# Patient Record
Sex: Female | Born: 1960 | Race: Black or African American | Hispanic: No | Marital: Single | Smoking: Current every day smoker
Health system: Southern US, Community
[De-identification: ages and names within clinical notes are randomized; demographics above are authoritative.]

## PROBLEM LIST (undated history)

## (undated) DIAGNOSIS — L309 Dermatitis, unspecified: Secondary | ICD-10-CM

## (undated) DIAGNOSIS — K5730 Diverticulosis of large intestine without perforation or abscess without bleeding: Secondary | ICD-10-CM

## (undated) DIAGNOSIS — K219 Gastro-esophageal reflux disease without esophagitis: Secondary | ICD-10-CM

## (undated) DIAGNOSIS — I10 Essential (primary) hypertension: Secondary | ICD-10-CM

## (undated) DIAGNOSIS — F329 Major depressive disorder, single episode, unspecified: Secondary | ICD-10-CM

## (undated) DIAGNOSIS — R011 Cardiac murmur, unspecified: Secondary | ICD-10-CM

## (undated) DIAGNOSIS — A048 Other specified bacterial intestinal infections: Secondary | ICD-10-CM

## (undated) DIAGNOSIS — T4145XA Adverse effect of unspecified anesthetic, initial encounter: Secondary | ICD-10-CM

## (undated) DIAGNOSIS — M719 Bursopathy, unspecified: Secondary | ICD-10-CM

## (undated) DIAGNOSIS — D649 Anemia, unspecified: Secondary | ICD-10-CM

## (undated) DIAGNOSIS — F419 Anxiety disorder, unspecified: Secondary | ICD-10-CM

## (undated) DIAGNOSIS — R51 Headache: Secondary | ICD-10-CM

## (undated) DIAGNOSIS — F99 Mental disorder, not otherwise specified: Secondary | ICD-10-CM

## (undated) DIAGNOSIS — E039 Hypothyroidism, unspecified: Secondary | ICD-10-CM

## (undated) DIAGNOSIS — M1990 Unspecified osteoarthritis, unspecified site: Secondary | ICD-10-CM

## (undated) DIAGNOSIS — J189 Pneumonia, unspecified organism: Secondary | ICD-10-CM

## (undated) DIAGNOSIS — I499 Cardiac arrhythmia, unspecified: Secondary | ICD-10-CM

## (undated) DIAGNOSIS — F32A Depression, unspecified: Secondary | ICD-10-CM

## (undated) DIAGNOSIS — J42 Unspecified chronic bronchitis: Secondary | ICD-10-CM

## (undated) DIAGNOSIS — F319 Bipolar disorder, unspecified: Secondary | ICD-10-CM

## (undated) DIAGNOSIS — T8859XA Other complications of anesthesia, initial encounter: Secondary | ICD-10-CM

## (undated) HISTORY — PX: COLONOSCOPY: SHX174

## (undated) HISTORY — DX: Other specified bacterial intestinal infections: A04.8

## (undated) HISTORY — PX: TUBAL LIGATION: SHX77

## (undated) HISTORY — PX: MOUTH SURGERY: SHX715

## (undated) HISTORY — DX: Diverticulosis of large intestine without perforation or abscess without bleeding: K57.30

## (undated) HISTORY — PX: ABDOMINAL HYSTERECTOMY: SHX81

---

## 1998-03-20 ENCOUNTER — Emergency Department (HOSPITAL_COMMUNITY): Admission: EM | Admit: 1998-03-20 | Discharge: 1998-03-20 | Payer: Self-pay | Admitting: Emergency Medicine

## 1998-06-17 ENCOUNTER — Ambulatory Visit (HOSPITAL_COMMUNITY): Admission: RE | Admit: 1998-06-17 | Discharge: 1998-06-17 | Payer: Self-pay | Admitting: Cardiology

## 1998-06-21 ENCOUNTER — Ambulatory Visit (HOSPITAL_COMMUNITY): Admission: RE | Admit: 1998-06-21 | Discharge: 1998-06-21 | Payer: Self-pay | Admitting: Neurology

## 1998-07-09 ENCOUNTER — Emergency Department (HOSPITAL_COMMUNITY): Admission: EM | Admit: 1998-07-09 | Discharge: 1998-07-09 | Payer: Self-pay | Admitting: Emergency Medicine

## 1998-09-21 ENCOUNTER — Ambulatory Visit (HOSPITAL_COMMUNITY): Admission: RE | Admit: 1998-09-21 | Discharge: 1998-09-21 | Payer: Self-pay | Admitting: Cardiology

## 1999-09-18 ENCOUNTER — Emergency Department (HOSPITAL_COMMUNITY): Admission: EM | Admit: 1999-09-18 | Discharge: 1999-09-18 | Payer: Self-pay | Admitting: Emergency Medicine

## 1999-09-18 ENCOUNTER — Encounter: Payer: Self-pay | Admitting: Cardiology

## 2001-01-18 ENCOUNTER — Emergency Department (HOSPITAL_COMMUNITY): Admission: EM | Admit: 2001-01-18 | Discharge: 2001-01-18 | Payer: Self-pay | Admitting: Emergency Medicine

## 2001-04-02 ENCOUNTER — Encounter: Payer: Self-pay | Admitting: Cardiology

## 2001-04-02 ENCOUNTER — Ambulatory Visit (HOSPITAL_COMMUNITY): Admission: RE | Admit: 2001-04-02 | Discharge: 2001-04-02 | Payer: Self-pay | Admitting: Cardiology

## 2001-08-22 ENCOUNTER — Encounter: Payer: Self-pay | Admitting: Emergency Medicine

## 2001-08-22 ENCOUNTER — Emergency Department (HOSPITAL_COMMUNITY): Admission: EM | Admit: 2001-08-22 | Discharge: 2001-08-22 | Payer: Self-pay | Admitting: Emergency Medicine

## 2001-08-23 ENCOUNTER — Encounter: Payer: Self-pay | Admitting: Emergency Medicine

## 2001-08-23 ENCOUNTER — Emergency Department (HOSPITAL_COMMUNITY): Admission: EM | Admit: 2001-08-23 | Discharge: 2001-08-23 | Payer: Self-pay | Admitting: Emergency Medicine

## 2001-11-18 ENCOUNTER — Encounter: Payer: Self-pay | Admitting: Cardiology

## 2001-11-18 ENCOUNTER — Ambulatory Visit (HOSPITAL_COMMUNITY): Admission: RE | Admit: 2001-11-18 | Discharge: 2001-11-18 | Payer: Self-pay | Admitting: Cardiology

## 2001-12-05 ENCOUNTER — Ambulatory Visit (HOSPITAL_COMMUNITY): Admission: RE | Admit: 2001-12-05 | Discharge: 2001-12-05 | Payer: Self-pay | Admitting: Internal Medicine

## 2001-12-05 ENCOUNTER — Encounter: Payer: Self-pay | Admitting: Internal Medicine

## 2002-05-02 ENCOUNTER — Emergency Department (HOSPITAL_COMMUNITY): Admission: EM | Admit: 2002-05-02 | Discharge: 2002-05-02 | Payer: Self-pay | Admitting: Emergency Medicine

## 2002-08-12 ENCOUNTER — Other Ambulatory Visit: Admission: RE | Admit: 2002-08-12 | Discharge: 2002-08-12 | Payer: Self-pay | Admitting: Obstetrics and Gynecology

## 2002-11-12 ENCOUNTER — Ambulatory Visit (HOSPITAL_COMMUNITY): Admission: RE | Admit: 2002-11-12 | Discharge: 2002-11-12 | Payer: Self-pay | Admitting: Cardiology

## 2002-11-12 ENCOUNTER — Encounter: Payer: Self-pay | Admitting: Cardiology

## 2002-12-23 ENCOUNTER — Emergency Department (HOSPITAL_COMMUNITY): Admission: EM | Admit: 2002-12-23 | Discharge: 2002-12-23 | Payer: Self-pay | Admitting: Emergency Medicine

## 2003-07-30 ENCOUNTER — Emergency Department (HOSPITAL_COMMUNITY): Admission: EM | Admit: 2003-07-30 | Discharge: 2003-07-30 | Payer: Self-pay | Admitting: Emergency Medicine

## 2003-09-07 ENCOUNTER — Ambulatory Visit (HOSPITAL_COMMUNITY): Admission: RE | Admit: 2003-09-07 | Discharge: 2003-09-07 | Payer: Self-pay | Admitting: Obstetrics

## 2005-01-30 ENCOUNTER — Emergency Department (HOSPITAL_COMMUNITY): Admission: EM | Admit: 2005-01-30 | Discharge: 2005-01-30 | Payer: Self-pay | Admitting: Emergency Medicine

## 2005-01-31 ENCOUNTER — Emergency Department (HOSPITAL_COMMUNITY): Admission: EM | Admit: 2005-01-31 | Discharge: 2005-01-31 | Payer: Self-pay | Admitting: Emergency Medicine

## 2005-06-12 ENCOUNTER — Emergency Department (HOSPITAL_COMMUNITY): Admission: EM | Admit: 2005-06-12 | Discharge: 2005-06-12 | Payer: Self-pay | Admitting: Emergency Medicine

## 2005-06-22 ENCOUNTER — Emergency Department (HOSPITAL_COMMUNITY): Admission: EM | Admit: 2005-06-22 | Discharge: 2005-06-22 | Payer: Self-pay | Admitting: Family Medicine

## 2005-07-16 ENCOUNTER — Encounter: Admission: RE | Admit: 2005-07-16 | Discharge: 2005-07-16 | Payer: Self-pay | Admitting: Orthopedic Surgery

## 2006-02-06 ENCOUNTER — Encounter: Admission: RE | Admit: 2006-02-06 | Discharge: 2006-02-06 | Payer: Self-pay | Admitting: Cardiology

## 2006-09-05 ENCOUNTER — Emergency Department (HOSPITAL_COMMUNITY): Admission: EM | Admit: 2006-09-05 | Discharge: 2006-09-05 | Payer: Self-pay | Admitting: Family Medicine

## 2006-10-20 ENCOUNTER — Encounter: Admission: RE | Admit: 2006-10-20 | Discharge: 2006-10-20 | Payer: Self-pay | Admitting: Orthopedic Surgery

## 2006-11-06 ENCOUNTER — Ambulatory Visit (HOSPITAL_COMMUNITY): Admission: RE | Admit: 2006-11-06 | Discharge: 2006-11-06 | Payer: Self-pay | Admitting: Obstetrics

## 2007-04-11 ENCOUNTER — Encounter: Admission: RE | Admit: 2007-04-11 | Discharge: 2007-04-11 | Payer: Self-pay | Admitting: Cardiology

## 2007-06-01 ENCOUNTER — Encounter: Admission: RE | Admit: 2007-06-01 | Discharge: 2007-06-01 | Payer: Self-pay | Admitting: Orthopedic Surgery

## 2007-07-04 ENCOUNTER — Encounter: Admission: RE | Admit: 2007-07-04 | Discharge: 2007-07-04 | Payer: Self-pay | Admitting: Neurology

## 2007-09-13 ENCOUNTER — Emergency Department (HOSPITAL_COMMUNITY): Admission: EM | Admit: 2007-09-13 | Discharge: 2007-09-13 | Payer: Self-pay | Admitting: Family Medicine

## 2007-10-24 ENCOUNTER — Encounter: Payer: Self-pay | Admitting: Obstetrics

## 2007-10-24 ENCOUNTER — Inpatient Hospital Stay (HOSPITAL_COMMUNITY): Admission: RE | Admit: 2007-10-24 | Discharge: 2007-10-26 | Payer: Self-pay | Admitting: Obstetrics

## 2007-11-07 ENCOUNTER — Ambulatory Visit (HOSPITAL_COMMUNITY): Admission: RE | Admit: 2007-11-07 | Discharge: 2007-11-07 | Payer: Self-pay | Admitting: Obstetrics

## 2007-11-19 ENCOUNTER — Encounter: Admission: RE | Admit: 2007-11-19 | Discharge: 2007-11-19 | Payer: Self-pay | Admitting: Obstetrics

## 2008-02-20 ENCOUNTER — Emergency Department (HOSPITAL_COMMUNITY): Admission: EM | Admit: 2008-02-20 | Discharge: 2008-02-20 | Payer: Self-pay | Admitting: Emergency Medicine

## 2008-04-06 ENCOUNTER — Encounter: Admission: RE | Admit: 2008-04-06 | Discharge: 2008-05-20 | Payer: Self-pay | Admitting: Orthopedic Surgery

## 2008-06-15 ENCOUNTER — Encounter: Admission: RE | Admit: 2008-06-15 | Discharge: 2008-06-15 | Payer: Self-pay | Admitting: Neurology

## 2008-06-20 ENCOUNTER — Encounter: Admission: RE | Admit: 2008-06-20 | Discharge: 2008-06-20 | Payer: Self-pay | Admitting: Neurology

## 2008-12-02 ENCOUNTER — Encounter: Admission: RE | Admit: 2008-12-02 | Discharge: 2008-12-02 | Payer: Self-pay | Admitting: Obstetrics

## 2009-04-04 ENCOUNTER — Emergency Department (HOSPITAL_COMMUNITY): Admission: EM | Admit: 2009-04-04 | Discharge: 2009-04-04 | Payer: Self-pay | Admitting: Family Medicine

## 2009-12-03 ENCOUNTER — Encounter: Admission: RE | Admit: 2009-12-03 | Discharge: 2009-12-03 | Payer: Self-pay | Admitting: Obstetrics

## 2010-03-14 ENCOUNTER — Encounter: Admission: RE | Admit: 2010-03-14 | Discharge: 2010-04-07 | Payer: Self-pay | Admitting: Orthopedic Surgery

## 2010-04-04 ENCOUNTER — Emergency Department (HOSPITAL_COMMUNITY): Admission: EM | Admit: 2010-04-04 | Discharge: 2010-04-04 | Payer: Self-pay | Admitting: Emergency Medicine

## 2010-09-25 ENCOUNTER — Encounter: Payer: Self-pay | Admitting: Obstetrics

## 2010-11-22 ENCOUNTER — Emergency Department (HOSPITAL_COMMUNITY)
Admission: EM | Admit: 2010-11-22 | Discharge: 2010-11-22 | Disposition: A | Discharge status: Home / Self Care | Payer: Medicaid Other | Attending: Emergency Medicine | Admitting: Emergency Medicine

## 2010-11-22 DIAGNOSIS — F341 Dysthymic disorder: Secondary | ICD-10-CM | POA: Insufficient documentation

## 2010-11-22 DIAGNOSIS — M129 Arthropathy, unspecified: Secondary | ICD-10-CM | POA: Insufficient documentation

## 2010-11-22 DIAGNOSIS — R221 Localized swelling, mass and lump, neck: Secondary | ICD-10-CM | POA: Insufficient documentation

## 2010-11-22 DIAGNOSIS — E669 Obesity, unspecified: Secondary | ICD-10-CM | POA: Insufficient documentation

## 2010-11-22 DIAGNOSIS — Z79899 Other long term (current) drug therapy: Secondary | ICD-10-CM | POA: Insufficient documentation

## 2010-11-22 DIAGNOSIS — K0381 Cracked tooth: Secondary | ICD-10-CM | POA: Insufficient documentation

## 2010-11-22 DIAGNOSIS — E039 Hypothyroidism, unspecified: Secondary | ICD-10-CM | POA: Insufficient documentation

## 2010-11-22 DIAGNOSIS — K089 Disorder of teeth and supporting structures, unspecified: Secondary | ICD-10-CM | POA: Insufficient documentation

## 2010-11-22 DIAGNOSIS — R220 Localized swelling, mass and lump, head: Secondary | ICD-10-CM | POA: Insufficient documentation

## 2010-11-22 DIAGNOSIS — I10 Essential (primary) hypertension: Secondary | ICD-10-CM | POA: Insufficient documentation

## 2010-12-02 ENCOUNTER — Other Ambulatory Visit: Payer: Self-pay | Admitting: Obstetrics

## 2010-12-02 DIAGNOSIS — Z1231 Encounter for screening mammogram for malignant neoplasm of breast: Secondary | ICD-10-CM

## 2010-12-07 ENCOUNTER — Ambulatory Visit
Admission: RE | Admit: 2010-12-07 | Discharge: 2010-12-07 | Disposition: A | Discharge status: Home / Self Care | Payer: Medicaid Other | Source: Ambulatory Visit | Attending: Obstetrics | Admitting: Obstetrics

## 2010-12-07 DIAGNOSIS — Z1231 Encounter for screening mammogram for malignant neoplasm of breast: Secondary | ICD-10-CM

## 2011-01-17 NOTE — Op Note (Signed)
NAMEOCEANNA, [name]               ACCOUNT NO.:  [id number]   MEDICAL RECORD NO.:  [id number]          PATIENT TYPE:  INP   LOCATION:  9302                          FACILITY:  [facility]   PHYSICIAN:  Charles A. Clearance Coots, M.D.DATE OF BIRTH:  04-21-1961   DATE OF PROCEDURE:  10/24/2007  DATE OF DISCHARGE:                               OPERATIVE REPORT   PREOPERATIVE DIAGNOSES:  1. Symptomatic uterine fibroids.  2. Menorrhagia.   POSTOPERATIVE DIAGNOSES:  1. Symptomatic uterine fibroids.  2. Menorrhagia.   PROCEDURE:  Total abdominal hysterectomy, bilateral salpingectomy.   SURGEON:  Charles A. Clearance Coots, M.D.   ASSISTANT:  Roseanna Rainbow, M.D.   ANESTHESIA:  General.   ESTIMATED BLOOD LOSS:  300 mL.   COMPLICATIONS:  None.   DRAINS:  Foley to gravity   FINDINGS:  Fibroid uterus.   SPECIMEN:  Uterus, cervix, and fallopian tubes.   DISPOSITION OF SPECIMENS:  To pathology.   OPERATION:  The patient was brought to the operating room after  satisfactory general endotracheal anesthesia.  The patient was placed in  the supine position, and the abdomen was prepped and draped in the usual  sterile fashion.   A Pfannenstiel skin incision was made with a scalpel down to the fascia.  The fascia was nicked in the midline, and the fascial incision was  extended to the left and to the right with curved Mayo scissors.  The  superior and inferior fascial edges were taken off of the rectus muscles  with both blunt and sharp dissection.  The rectus muscle was bluntly and  sharply divided in the midline.  The peritoneum was entered digitally  and was sharply transected superiorly and inferiorly, being careful to  avoid the urinary bladder inferiorly.  The uterus was then exteriorized,  and it was noted to be approximately 16 to 18-week size.  The cornu of  the uterus was crossclamped with Kelly forceps across the utero-ovarian  and broad ligaments.  The round ligaments were identified  bilaterally  and were clamped with Kelly forceps and suture ligated with transfixion  sutures of 0 Vicryl.  The anterior vesicouterine fold of peritoneum was  then transected with the Bovie, and the urinary bladder was sharply  transected away from the lower uterine segment and cervix down away from  the operative field.  The medial broad ligament was tented digitally and  the broad ligament and utero-ovarian ligaments were crossclamped with  parametrial clamp  and the Kelly forceps was crossclamped across the  utero-ovarian and broad ligament above.  The utero-ovarian and broad  ligaments were then transected bilaterally with curved Mayo scissors.  A  free tie of 0 Vicryl was placed beneath the bottom clamps, and the  pedicle was suture ligated withtransfixion sutures of 0 Vicryl above the  knot.  The same procedure was performed on the opposite side.  The  uterine vessels were then skeletonized and crossclamped at a right-angle  with curved parametrial clamp.  A straight parametrial clamp was placed  above for hemostasis.  The uterine vessels were then transected with the  scalpel and suture  ligated with transfixion suture of 0 Vicryl.  The  same procedure was performed on the opposite side.  The cardinal  ligaments were then serially clamped with straight parametrial clamps  bilaterally, cut, and suture ligated with transfixion sutures of 0  Vicryl down to the uterosacral ligaments.  The uterosacral ligaments  were then clamped bilaterally with curved parametrial clamps, cut, and  suture ligated with transfixion sutures of 0 Vicryl and were held with  hemostats for later closure of the vaginal cuff.  The vaginal cuff was  then crossclamped from each corner with curved parametrial clamps  meeting in the center, and the uterus with the cervix was then  transected away from the vaginal cuff and submitted to pathology for  evaluation.  The corners of the vaginal cuff were then closed with   transfixion sutures of 0 Vicryl, and the center of the vaginal cuff was  closed with interrupted sutures of 0 Vicryl.  Hemostasis was excellent.  All pedicles were then reexamined for hemostasis, and there was no  active bleeding noted.  The pelvic cavity was then thoroughly irrigated  with warm saline solution, and all clots were removed.  The Lenox Ahr self-retaining retractor which had been placed in the  incision was then removed, and all abdominal packing that had been  placed was also removed.  The abdomen was then closed as follows.  The  parietal peritoneum was closed with a continuous suture of 2-0 Monocryl.  The fascia was closed with continuous suture of 0 PDS from each corner  to the center.  The subcutaneous tissue was thoroughly irrigated with  warm saline solution.  All areas of subcutaneous bleeding were  coagulated with the Bovie.  The skin was then closed with continuous  subcuticular suture of 4-0 Monocryl.  The surgical technician indicated  that all needle, sponge, and instrument counts were correct x2.  A  sterile bandage was applied to the incision closure.   The patient tolerated the procedure well and was transported to the  recovery room in satisfactory condition.      Charles A. Clearance Coots, M.D.  Electronically Signed     CAH/MEDQ  D:  10/24/2007  T:  10/25/2007  Job:  [id number]

## 2011-01-17 NOTE — Discharge Summary (Signed)
[name]               ACCOUNT NO.:  [id number]   MEDICAL RECORD NO.:  [id number]         PATIENT TYPE:  INP   LOCATION:  9302                          FACILITY:  [facility]   PHYSICIAN:  Charles A. Clearance Coots, M.D.DATE OF BIRTH:  28-May-1961   DATE OF ADMISSION:  10/24/2007  DATE OF DISCHARGE:  10/26/2007                               DISCHARGE SUMMARY   ADMITTING DIAGNOSES:  1. Symptomatic uterine fibroids.  2. Menorrhagia.   DISCHARGE DIAGNOSES:  1. Symptomatic uterine fibroids.  2. Menorrhagia.  3. Status post total abdominal hysterectomy and bilateral      salpingectomy.   The patient was discharged home in good condition.   REASON FOR ADMISSION:  A 50 year old G5, P5, last menstrual period  October 01, 2007.  The patient has a history of heavy painful periods,  which have worsened over the past year.  She desired definitive surgical  therapy.   PAST MEDICAL HISTORY:  Surgery:  None.  Illnesses:  Hypertension, hypothyroidism, depression, and anemia.   MEDICATIONS:  Tenormin, hydrochlorothiazide, Synthroid, Exforge, and  clorazepate.   ALLERGIES:  1. AVELOX.  2. CIPRO.  3. ERYTHROMYCIN.  4. PENICILLIN.  5. SULFA.  6. AZITHROMYCIN.   FAMILY HISTORY:  Positive for hypertension and cardiovascular disease.   PHYSICAL EXAM:  Well-nourished and well-developed female in no acute  distress.  Temperature 98.5, pulse 80, respiratory rate 18, and blood pressure  130/82.  LUNGS:  Clear to auscultation bilaterally.  HEART:  Regular rate and rhythm.  ABDOMEN:  Soft and nontender.  PELVIC:  Uterus 16-18 week size and tender.   ADMITTING LABORATORY VALUES:  Hemoglobin 13, hematocrit 40, white blood  cell count 4700, and platelets 258,000.  Sodium was 136, potassium 3.5,  glucose 101, BUN 8, and creatinine 1.03.   HOSPITAL COURSE:  The patient underwent a total abdominal hysterectomy  and bilateral salpingectomy on October 24, 2007.  There were no  intraoperative  complications.  Postoperative course was uncomplicated,  and she was discharged home on postop day #2 in good condition.   DISCHARGE LABORATORY VALUES:  Hemoglobin 12, hematocrit 35, white blood  cell count 13,000, and platelets 216,000.   DISCHARGE DISPOSITION/MEDICATIONS:  Continue medications that were taken  before admission to the hospital.  Tylox and ibuprofen were prescribed  for pain.  Routine written instructions were given for discharge after  abdominal hysterectomy.  The patient is to call office for a followup  appointment in 2 weeks.      Charles A. Clearance Coots, M.D.  Electronically Signed     CAH/MEDQ  D:  11/14/2007  T:  11/14/2007  Job:  [id number]

## 2011-05-25 LAB — POCT URINALYSIS DIP (DEVICE)
Bilirubin Urine: NEGATIVE
Glucose, UA: NEGATIVE
Hgb urine dipstick: NEGATIVE
Ketones, ur: NEGATIVE
Nitrite: NEGATIVE
Operator id: 239701
Protein, ur: NEGATIVE
Specific Gravity, Urine: 1.015
Urobilinogen, UA: 1
pH: 8.5 — ABNORMAL HIGH

## 2011-05-26 LAB — BASIC METABOLIC PANEL
BUN: 8
CO2: 32
Calcium: 9.6
Chloride: 97
Creatinine, Ser: 1.03
GFR calc Af Amer: >60
GFR calc non Af Amer: 57 — ABNORMAL LOW
Glucose, Bld: 101 — ABNORMAL HIGH
Potassium: 3.5
Sodium: 136

## 2011-05-26 LAB — PREGNANCY, URINE: Preg Test, Ur: NEGATIVE

## 2011-05-26 LAB — CBC
HCT: 35.3 — ABNORMAL LOW
HCT: 40.7
Hemoglobin: 12
Hemoglobin: 13.7
MCHC: 33.6
MCHC: 33.9
MCV: 85.8
MCV: 86.7
Platelets: 216
Platelets: 258
RBC: 4.07
RBC: 4.74
RDW: 13.3
RDW: 13.8
WBC: 13.4 — ABNORMAL HIGH
WBC: 4.7

## 2011-05-30 ENCOUNTER — Emergency Department (HOSPITAL_COMMUNITY)
Admission: EM | Admit: 2011-05-30 | Discharge: 2011-05-30 | Disposition: A | Discharge status: Home / Self Care | Payer: Medicaid Other | Attending: Emergency Medicine | Admitting: Emergency Medicine

## 2011-05-30 DIAGNOSIS — I10 Essential (primary) hypertension: Secondary | ICD-10-CM | POA: Insufficient documentation

## 2011-05-30 DIAGNOSIS — F17200 Nicotine dependence, unspecified, uncomplicated: Secondary | ICD-10-CM | POA: Insufficient documentation

## 2011-05-30 DIAGNOSIS — M549 Dorsalgia, unspecified: Secondary | ICD-10-CM | POA: Insufficient documentation

## 2011-05-30 DIAGNOSIS — E039 Hypothyroidism, unspecified: Secondary | ICD-10-CM | POA: Insufficient documentation

## 2011-05-30 DIAGNOSIS — Z79899 Other long term (current) drug therapy: Secondary | ICD-10-CM | POA: Insufficient documentation

## 2011-05-30 DIAGNOSIS — F411 Generalized anxiety disorder: Secondary | ICD-10-CM | POA: Insufficient documentation

## 2011-06-01 LAB — POCT I-STAT, CHEM 8
BUN: 7
Calcium, Ion: 1.15
Chloride: 102
Creatinine, Ser: 1
Glucose, Bld: 102 — ABNORMAL HIGH
HCT: 43
Hemoglobin: 14.6
Potassium: 3.6
Sodium: 139
TCO2: 27

## 2011-06-26 ENCOUNTER — Other Ambulatory Visit: Payer: Self-pay | Admitting: Orthopedic Surgery

## 2011-06-26 DIAGNOSIS — M545 Low back pain, unspecified: Secondary | ICD-10-CM

## 2011-06-30 ENCOUNTER — Ambulatory Visit
Admission: RE | Admit: 2011-06-30 | Discharge: 2011-06-30 | Disposition: A | Discharge status: Home / Self Care | Payer: Medicaid Other | Source: Ambulatory Visit | Attending: Orthopedic Surgery | Admitting: Orthopedic Surgery

## 2011-06-30 DIAGNOSIS — M545 Low back pain, unspecified: Secondary | ICD-10-CM

## 2011-07-01 ENCOUNTER — Other Ambulatory Visit: Payer: Medicaid Other

## 2011-07-17 ENCOUNTER — Ambulatory Visit: Payer: Medicaid Other | Attending: Physical Medicine and Rehabilitation

## 2011-07-17 DIAGNOSIS — M2550 Pain in unspecified joint: Secondary | ICD-10-CM | POA: Insufficient documentation

## 2011-07-17 DIAGNOSIS — M6281 Muscle weakness (generalized): Secondary | ICD-10-CM | POA: Insufficient documentation

## 2011-07-17 DIAGNOSIS — IMO0001 Reserved for inherently not codable concepts without codable children: Secondary | ICD-10-CM | POA: Insufficient documentation

## 2011-08-01 ENCOUNTER — Ambulatory Visit: Payer: Medicaid Other | Admitting: Physical Therapy

## 2011-08-03 ENCOUNTER — Encounter: Payer: Medicaid Other | Admitting: Physical Therapy

## 2011-08-07 ENCOUNTER — Ambulatory Visit: Payer: Medicaid Other | Admitting: Physical Therapy

## 2011-08-07 ENCOUNTER — Other Ambulatory Visit: Payer: Self-pay | Admitting: Neurosurgery

## 2011-08-09 ENCOUNTER — Ambulatory Visit: Payer: Medicaid Other | Attending: Physical Medicine and Rehabilitation | Admitting: Physical Therapy

## 2011-08-09 ENCOUNTER — Encounter: Payer: Medicaid Other | Admitting: Physical Therapy

## 2011-08-09 DIAGNOSIS — M2550 Pain in unspecified joint: Secondary | ICD-10-CM | POA: Insufficient documentation

## 2011-08-09 DIAGNOSIS — IMO0001 Reserved for inherently not codable concepts without codable children: Secondary | ICD-10-CM | POA: Insufficient documentation

## 2011-08-09 DIAGNOSIS — M6281 Muscle weakness (generalized): Secondary | ICD-10-CM | POA: Insufficient documentation

## 2011-08-15 ENCOUNTER — Ambulatory Visit: Payer: Medicaid Other

## 2011-08-21 ENCOUNTER — Encounter (HOSPITAL_COMMUNITY): Payer: Self-pay

## 2011-08-21 MED ORDER — VANCOMYCIN HCL IN DEXTROSE 1-5 GM/200ML-% IV SOLN
1000.0000 mg | INTRAVENOUS | Status: DC
  Filled 2011-08-21 (×3): qty 200

## 2011-08-22 ENCOUNTER — Encounter (HOSPITAL_COMMUNITY)
Admission: RE | Disposition: A | Discharge status: Home / Self Care | Payer: Self-pay | Source: Ambulatory Visit | Attending: Neurosurgery

## 2011-08-22 ENCOUNTER — Encounter (HOSPITAL_COMMUNITY): Payer: Self-pay | Admitting: Anesthesiology

## 2011-08-22 ENCOUNTER — Other Ambulatory Visit: Payer: Self-pay

## 2011-08-22 ENCOUNTER — Encounter (HOSPITAL_COMMUNITY): Payer: Self-pay | Admitting: Pharmacy Technician

## 2011-08-22 ENCOUNTER — Ambulatory Visit (HOSPITAL_COMMUNITY): Payer: Medicaid Other | Admitting: Anesthesiology

## 2011-08-22 ENCOUNTER — Ambulatory Visit (HOSPITAL_COMMUNITY): Payer: Medicaid Other

## 2011-08-22 ENCOUNTER — Ambulatory Visit (HOSPITAL_COMMUNITY)
Admission: RE | Admit: 2011-08-22 | Discharge: 2011-08-23 | Disposition: A | Discharge status: Home / Self Care | Payer: Medicaid Other | Source: Ambulatory Visit | Attending: Neurosurgery | Admitting: Neurosurgery

## 2011-08-22 DIAGNOSIS — M5126 Other intervertebral disc displacement, lumbar region: Secondary | ICD-10-CM | POA: Insufficient documentation

## 2011-08-22 DIAGNOSIS — M47817 Spondylosis without myelopathy or radiculopathy, lumbosacral region: Secondary | ICD-10-CM | POA: Insufficient documentation

## 2011-08-22 DIAGNOSIS — F39 Unspecified mood [affective] disorder: Secondary | ICD-10-CM | POA: Insufficient documentation

## 2011-08-22 DIAGNOSIS — I10 Essential (primary) hypertension: Secondary | ICD-10-CM | POA: Insufficient documentation

## 2011-08-22 HISTORY — DX: Depression, unspecified: F32.A

## 2011-08-22 HISTORY — DX: Headache: R51

## 2011-08-22 HISTORY — DX: Essential (primary) hypertension: I10

## 2011-08-22 HISTORY — DX: Anemia, unspecified: D64.9

## 2011-08-22 HISTORY — DX: Mental disorder, not otherwise specified: F99

## 2011-08-22 HISTORY — DX: Hypothyroidism, unspecified: E03.9

## 2011-08-22 HISTORY — DX: Anxiety disorder, unspecified: F41.9

## 2011-08-22 HISTORY — DX: Pneumonia, unspecified organism: J18.9

## 2011-08-22 HISTORY — PX: LUMBAR LAMINECTOMY: SHX95

## 2011-08-22 HISTORY — DX: Major depressive disorder, single episode, unspecified: F32.9

## 2011-08-22 HISTORY — DX: Bursopathy, unspecified: M71.9

## 2011-08-22 HISTORY — DX: Cardiac arrhythmia, unspecified: I49.9

## 2011-08-22 HISTORY — DX: Other complications of anesthesia, initial encounter: T88.59XA

## 2011-08-22 HISTORY — DX: Unspecified osteoarthritis, unspecified site: M19.90

## 2011-08-22 HISTORY — DX: Cardiac murmur, unspecified: R01.1

## 2011-08-22 HISTORY — DX: Adverse effect of unspecified anesthetic, initial encounter: T41.45XA

## 2011-08-22 LAB — CBC
HCT: 40.7 % (ref 36.0–46.0)
Hemoglobin: 13.2 g/dL (ref 12.0–15.0)
MCH: 27.8 pg (ref 26.0–34.0)
MCHC: 32.4 g/dL (ref 30.0–36.0)
MCV: 85.7 fL (ref 78.0–100.0)
Platelets: 239 10*3/uL (ref 150–400)
RBC: 4.75 MIL/uL (ref 3.87–5.11)
RDW: 14.6 % (ref 11.5–15.5)
WBC: 6 10*3/uL (ref 4.0–10.5)

## 2011-08-22 LAB — BASIC METABOLIC PANEL
BUN: 8 mg/dL (ref 6–23)
CO2: 28 mEq/L (ref 19–32)
Calcium: 9.7 mg/dL (ref 8.4–10.5)
Chloride: 103 mEq/L (ref 96–112)
Creatinine, Ser: 0.8 mg/dL (ref 0.50–1.10)
GFR calc Af Amer: >90 mL/min (ref >90)
GFR calc non Af Amer: 84 mL/min — ABNORMAL LOW (ref >90)
Glucose, Bld: 94 mg/dL (ref 70–99)
Potassium: 3.3 mEq/L — ABNORMAL LOW (ref 3.5–5.1)
Sodium: 141 mEq/L (ref 135–145)

## 2011-08-22 LAB — SURGICAL PCR SCREEN
MRSA, PCR: NEGATIVE
Staphylococcus aureus: NEGATIVE

## 2011-08-22 SURGERY — MICRODISCECTOMY LUMBAR LAMINECTOMY
Anesthesia: General | Site: Back | Laterality: Right | Wound class: Clean

## 2011-08-22 MED ORDER — KCL IN DEXTROSE-NACL 20-5-0.45 MEQ/L-%-% IV SOLN
INTRAVENOUS | Status: DC
  Filled 2011-08-22 (×2): qty 1000

## 2011-08-22 MED ORDER — SODIUM CHLORIDE 0.9 % IV SOLN
INTRAVENOUS | Status: AC
  Filled 2011-08-22: qty 500

## 2011-08-22 MED ORDER — GLYCOPYRROLATE 0.2 MG/ML IJ SOLN
INTRAMUSCULAR | Status: DC | PRN
  Administered 2011-08-22: .4 mg via INTRAVENOUS

## 2011-08-22 MED ORDER — LEVOTHYROXINE SODIUM 125 MCG PO TABS
125.0000 ug | ORAL_TABLET | Freq: Every day | ORAL | Status: DC
  Administered 2011-08-23: 125 ug via ORAL
  Filled 2011-08-22 (×2): qty 1

## 2011-08-22 MED ORDER — METHYLPREDNISOLONE ACETATE PF 80 MG/ML IJ SUSP
80.0000 mg | Freq: Once | INTRAMUSCULAR | Status: DC
  Filled 2011-08-22: qty 1

## 2011-08-22 MED ORDER — MORPHINE SULFATE 4 MG/ML IJ SOLN: 1.0000 mg | INTRAMUSCULAR | Status: DC | PRN

## 2011-08-22 MED ORDER — PROPOFOL 10 MG/ML IV BOLUS
INTRAVENOUS | Status: DC | PRN
  Administered 2011-08-22: 200 mg via INTRAVENOUS

## 2011-08-22 MED ORDER — ONDANSETRON HCL 4 MG/2ML IJ SOLN: 4.0000 mg | INTRAMUSCULAR | Status: DC | PRN

## 2011-08-22 MED ORDER — NEBIVOLOL HCL 10 MG PO TABS
10.0000 mg | ORAL_TABLET | Freq: Every day | ORAL | Status: DC
  Administered 2011-08-23: 10 mg via ORAL
  Filled 2011-08-22: qty 1

## 2011-08-22 MED ORDER — METHYLPREDNISOLONE ACETATE PF 80 MG/ML IJ SUSP
INTRAMUSCULAR | Status: DC | PRN
  Administered 2011-08-22: 80 mg

## 2011-08-22 MED ORDER — CEFAZOLIN SODIUM 1-5 GM-% IV SOLN: 1.0000 g | Freq: Three times a day (TID) | INTRAVENOUS | Status: DC

## 2011-08-22 MED ORDER — ACETAMINOPHEN 650 MG RE SUPP: 650.0000 mg | RECTAL | Status: DC | PRN

## 2011-08-22 MED ORDER — FENTANYL CITRATE 0.05 MG/ML IJ SOLN
INTRAMUSCULAR | Status: AC
  Filled 2011-08-22: qty 2

## 2011-08-22 MED ORDER — OLMESARTAN MEDOXOMIL 40 MG PO TABS
40.0000 mg | ORAL_TABLET | Freq: Every day | ORAL | Status: DC
  Administered 2011-08-23: 40 mg via ORAL
  Filled 2011-08-22: qty 1

## 2011-08-22 MED ORDER — HYDROMORPHONE HCL PF 1 MG/ML IJ SOLN: 0.2500 mg | INTRAMUSCULAR | Status: DC | PRN

## 2011-08-22 MED ORDER — HYDROCODONE-ACETAMINOPHEN 5-325 MG PO TABS
1.0000 | ORAL_TABLET | ORAL | Status: DC | PRN
  Administered 2011-08-22: 2 via ORAL
  Administered 2011-08-23: 1 via ORAL
  Filled 2011-08-22: qty 1

## 2011-08-22 MED ORDER — CLORAZEPATE DIPOTASSIUM 7.5 MG PO TABS
7.5000 mg | ORAL_TABLET | Freq: Two times a day (BID) | ORAL | Status: DC
  Administered 2011-08-22 – 2011-08-23 (×2): 7.5 mg via ORAL
  Filled 2011-08-22 (×2): qty 1

## 2011-08-22 MED ORDER — ONDANSETRON HCL 4 MG/2ML IJ SOLN: 4.0000 mg | Freq: Once | INTRAMUSCULAR | Status: DC | PRN

## 2011-08-22 MED ORDER — FENTANYL CITRATE 0.05 MG/ML IJ SOLN
INTRAMUSCULAR | Status: DC | PRN
  Administered 2011-08-22: 100 ug via INTRAVENOUS

## 2011-08-22 MED ORDER — CALCIUM POLYCARBOPHIL 625 MG PO TABS
625.0000 mg | ORAL_TABLET | Freq: Every day | ORAL | Status: DC
  Filled 2011-08-22: qty 1

## 2011-08-22 MED ORDER — MIDAZOLAM HCL 5 MG/5ML IJ SOLN
INTRAMUSCULAR | Status: DC | PRN
  Administered 2011-08-22: 2 mg via INTRAVENOUS

## 2011-08-22 MED ORDER — DIAZEPAM 5 MG PO TABS
5.0000 mg | ORAL_TABLET | Freq: Four times a day (QID) | ORAL | Status: DC | PRN
  Administered 2011-08-22 – 2011-08-23 (×2): 5 mg via ORAL
  Filled 2011-08-22: qty 1

## 2011-08-22 MED ORDER — ACETAMINOPHEN 325 MG PO TABS: 650.0000 mg | ORAL_TABLET | ORAL | Status: DC | PRN

## 2011-08-22 MED ORDER — LACTATED RINGERS IV SOLN
INTRAVENOUS | Status: DC | PRN
  Administered 2011-08-22 (×2): via INTRAVENOUS

## 2011-08-22 MED ORDER — 0.9 % SODIUM CHLORIDE (POUR BTL) OPTIME
TOPICAL | Status: DC | PRN
  Administered 2011-08-22: 1000 mL

## 2011-08-22 MED ORDER — FENTANYL CITRATE 0.05 MG/ML IJ SOLN
INTRAMUSCULAR | Status: DC | PRN
  Administered 2011-08-22: 150 ug via INTRAVENOUS
  Administered 2011-08-22 (×3): 100 ug via INTRAVENOUS
  Administered 2011-08-22: 50 ug via INTRAVENOUS

## 2011-08-22 MED ORDER — PHENOL 1.4 % MT LIQD: 1.0000 | OROMUCOSAL | Status: DC | PRN

## 2011-08-22 MED ORDER — ROCURONIUM BROMIDE 100 MG/10ML IV SOLN
INTRAVENOUS | Status: DC | PRN
  Administered 2011-08-22: 50 mg via INTRAVENOUS

## 2011-08-22 MED ORDER — HEMOSTATIC AGENTS (NO CHARGE) OPTIME
TOPICAL | Status: DC | PRN
  Administered 2011-08-22: 1 via TOPICAL

## 2011-08-22 MED ORDER — HYDROCODONE-ACETAMINOPHEN 5-325 MG PO TABS
ORAL_TABLET | ORAL | Status: AC
  Administered 2011-08-23: 1 via ORAL
  Filled 2011-08-22: qty 2

## 2011-08-22 MED ORDER — ZOLPIDEM TARTRATE 5 MG PO TABS: 10.0000 mg | ORAL_TABLET | Freq: Every evening | ORAL | Status: DC | PRN

## 2011-08-22 MED ORDER — VANCOMYCIN HCL 1000 MG IV SOLR
1250.0000 mg | Freq: Two times a day (BID) | INTRAVENOUS | Status: DC
  Administered 2011-08-23: 1250 mg via INTRAVENOUS
  Filled 2011-08-22 (×2): qty 1250

## 2011-08-22 MED ORDER — SODIUM CHLORIDE 0.9 % IR SOLN
Status: DC | PRN
  Administered 2011-08-22: 16:00:00

## 2011-08-22 MED ORDER — AMLODIPINE BESYLATE 5 MG PO TABS
5.0000 mg | ORAL_TABLET | Freq: Every day | ORAL | Status: DC
  Administered 2011-08-23: 5 mg via ORAL
  Filled 2011-08-22: qty 1

## 2011-08-22 MED ORDER — VANCOMYCIN HCL 1000 MG IV SOLR
1000.0000 mg | INTRAVENOUS | Status: DC | PRN
  Administered 2011-08-22: 1000 mg via INTRAVENOUS

## 2011-08-22 MED ORDER — NEOSTIGMINE METHYLSULFATE 1 MG/ML IJ SOLN
INTRAMUSCULAR | Status: DC | PRN
  Administered 2011-08-22: 2 mg via INTRAVENOUS

## 2011-08-22 MED ORDER — AMLODIPINE-OLMESARTAN 5-40 MG PO TABS: 1.0000 | ORAL_TABLET | Freq: Every day | ORAL | Status: DC

## 2011-08-22 MED ORDER — THROMBIN 5000 UNITS EX KIT
PACK | CUTANEOUS | Status: DC | PRN
  Administered 2011-08-22 (×2): 5000 [IU] via TOPICAL

## 2011-08-22 MED ORDER — SODIUM CHLORIDE 0.9 % IJ SOLN: 3.0000 mL | INTRAMUSCULAR | Status: DC | PRN

## 2011-08-22 MED ORDER — QUETIAPINE FUMARATE ER 50 MG PO TB24
50.0000 mg | ORAL_TABLET | Freq: Every day | ORAL | Status: DC
  Administered 2011-08-22: 50 mg via ORAL
  Filled 2011-08-22 (×3): qty 1

## 2011-08-22 MED ORDER — MEPERIDINE HCL 25 MG/ML IJ SOLN: 6.2500 mg | INTRAMUSCULAR | Status: DC | PRN

## 2011-08-22 MED ORDER — LIDOCAINE-EPINEPHRINE 1 %-1:100000 IJ SOLN
INTRAMUSCULAR | Status: DC | PRN
  Administered 2011-08-22: 20 mL

## 2011-08-22 MED ORDER — BACITRACIN 50000 UNITS IM SOLR
INTRAMUSCULAR | Status: AC
  Filled 2011-08-22: qty 50000

## 2011-08-22 MED ORDER — OXYCODONE-ACETAMINOPHEN 5-325 MG PO TABS: 1.0000 | ORAL_TABLET | ORAL | Status: DC | PRN

## 2011-08-22 MED ORDER — MENTHOL 3 MG MT LOZG: 1.0000 | LOZENGE | OROMUCOSAL | Status: DC | PRN

## 2011-08-22 MED ORDER — BUPIVACAINE HCL (PF) 0.25 % IJ SOLN
INTRAMUSCULAR | Status: DC | PRN
  Administered 2011-08-22: 20 mL

## 2011-08-22 MED ORDER — DIAZEPAM 5 MG PO TABS
ORAL_TABLET | ORAL | Status: AC
  Administered 2011-08-23: 5 mg via ORAL
  Filled 2011-08-22: qty 1

## 2011-08-22 MED ORDER — SODIUM CHLORIDE 0.9 % IJ SOLN: 3.0000 mL | Freq: Two times a day (BID) | INTRAMUSCULAR | Status: DC

## 2011-08-22 MED ORDER — VECURONIUM BROMIDE 10 MG IV SOLR
INTRAVENOUS | Status: DC | PRN
  Administered 2011-08-22: 2 mg via INTRAVENOUS

## 2011-08-22 MED ORDER — MUPIROCIN 2 % EX OINT
TOPICAL_OINTMENT | CUTANEOUS | Status: AC
  Filled 2011-08-22: qty 22

## 2011-08-22 SURGICAL SUPPLY — 57 items
BAG DECANTER FOR FLEXI CONT (MISCELLANEOUS) ×2 IMPLANT
BENZOIN TINCTURE PRP APPL 2/3 (GAUZE/BANDAGES/DRESSINGS) IMPLANT
BLADE SURG 11 STRL SS (BLADE) IMPLANT
BLADE SURG ROTATE 9660 (MISCELLANEOUS) IMPLANT
BRUSH SCRUB EZ PLAIN DRY (MISCELLANEOUS) ×2 IMPLANT
BUR PRECISION FLUTE 6.0 (BURR) IMPLANT
CANISTER SUCTION 2500CC (MISCELLANEOUS) ×2 IMPLANT
CLOTH BEACON ORANGE TIMEOUT ST (SAFETY) ×2 IMPLANT
CONT SPEC 4OZ CLIKSEAL STRL BL (MISCELLANEOUS) ×2 IMPLANT
DECANTER SPIKE VIAL GLASS SM (MISCELLANEOUS) ×2 IMPLANT
DERMABOND ADVANCED (GAUZE/BANDAGES/DRESSINGS) ×1
DERMABOND ADVANCED .7 DNX12 (GAUZE/BANDAGES/DRESSINGS) ×1 IMPLANT
DRAPE LAPAROTOMY 100X72X124 (DRAPES) ×2 IMPLANT
DRAPE MICROSCOPE LEICA (MISCELLANEOUS) ×2 IMPLANT
DRAPE POUCH INSTRU U-SHP 10X18 (DRAPES) ×2 IMPLANT
DRAPE PROXIMA HALF (DRAPES) IMPLANT
DRAPE SURG 17X23 STRL (DRAPES) ×2 IMPLANT
DRSG OPSITE 4X5.5 SM (GAUZE/BANDAGES/DRESSINGS) IMPLANT
ELECT BLADE 4.0 EZ CLEAN MEGAD (MISCELLANEOUS) ×2
ELECT REM PT RETURN 9FT ADLT (ELECTROSURGICAL) ×2
ELECTRODE BLDE 4.0 EZ CLN MEGD (MISCELLANEOUS) ×1 IMPLANT
ELECTRODE REM PT RTRN 9FT ADLT (ELECTROSURGICAL) ×1 IMPLANT
GAUZE SPONGE 4X4 16PLY XRAY LF (GAUZE/BANDAGES/DRESSINGS) IMPLANT
GLOVE BIO SURGEON STRL SZ8 (GLOVE) ×2 IMPLANT
GLOVE BIOGEL PI IND STRL 7.5 (GLOVE) ×2 IMPLANT
GLOVE BIOGEL PI IND STRL 8 (GLOVE) ×1 IMPLANT
GLOVE BIOGEL PI IND STRL 8.5 (GLOVE) ×1 IMPLANT
GLOVE BIOGEL PI INDICATOR 7.5 (GLOVE) ×2
GLOVE BIOGEL PI INDICATOR 8 (GLOVE) ×1
GLOVE BIOGEL PI INDICATOR 8.5 (GLOVE) ×1
GLOVE ECLIPSE 7.5 STRL STRAW (GLOVE) ×8 IMPLANT
GLOVE EXAM NITRILE LRG STRL (GLOVE) IMPLANT
GLOVE EXAM NITRILE MD LF STRL (GLOVE) IMPLANT
GLOVE EXAM NITRILE XL STR (GLOVE) IMPLANT
GLOVE EXAM NITRILE XS STR PU (GLOVE) IMPLANT
GLOVE INDICATOR 8.5 STRL (GLOVE) ×2 IMPLANT
GOWN BRE IMP SLV AUR LG STRL (GOWN DISPOSABLE) ×2 IMPLANT
GOWN BRE IMP SLV AUR XL STRL (GOWN DISPOSABLE) ×6 IMPLANT
GOWN STRL REIN 2XL LVL4 (GOWN DISPOSABLE) ×2 IMPLANT
KIT BASIN OR (CUSTOM PROCEDURE TRAY) ×2 IMPLANT
KIT ROOM TURNOVER OR (KITS) ×2 IMPLANT
NEEDLE HYPO 22GX1.5 SAFETY (NEEDLE) ×2 IMPLANT
NEEDLE SPNL 22GX3.5 QUINCKE BK (NEEDLE) ×2 IMPLANT
NS IRRIG 1000ML POUR BTL (IV SOLUTION) ×2 IMPLANT
PACK LAMINECTOMY NEURO (CUSTOM PROCEDURE TRAY) ×2 IMPLANT
RUBBERBAND STERILE (MISCELLANEOUS) ×4 IMPLANT
SPONGE GAUZE 4X4 12PLY (GAUZE/BANDAGES/DRESSINGS) ×2 IMPLANT
SPONGE SURGIFOAM ABS GEL SZ50 (HEMOSTASIS) ×2 IMPLANT
STRIP CLOSURE SKIN 1/2X4 (GAUZE/BANDAGES/DRESSINGS) IMPLANT
SUT VIC AB 0 CT1 18XCR BRD8 (SUTURE) ×1 IMPLANT
SUT VIC AB 0 CT1 8-18 (SUTURE) ×1
SUT VIC AB 2-0 CT1 18 (SUTURE) ×2 IMPLANT
SUT VICRYL 4-0 PS2 18IN ABS (SUTURE) IMPLANT
SYR 20ML ECCENTRIC (SYRINGE) ×2 IMPLANT
TOWEL OR 17X24 6PK STRL BLUE (TOWEL DISPOSABLE) ×2 IMPLANT
TOWEL OR 17X26 10 PK STRL BLUE (TOWEL DISPOSABLE) ×2 IMPLANT
WATER STERILE IRR 1000ML POUR (IV SOLUTION) ×2 IMPLANT

## 2011-08-22 NOTE — H&P (Signed)
NEUROSURGICAL CONSULTATION  [name]  #[id number]  DOB:  July 19, 1961      August 11, 2011   HISTORY OF PRESENT ILLNESS:  [name] is a 50 year old, stay-at-home mom with back and right leg pain which goes into her toes and right foot.  She says this began in September of this year and that she had an episode in 1998 of low back pain which was intermittent, but she now has severe right leg pain.  She has been taking Naprosyn 500 mg twice daily, Hydrocodone 1 to 2 per day,  Methocarbamol 500 mg 2 per day.  She says she only gets relief when she stands in the shower.  She describes pain into her right leg to her outer foot.  She describes that the pain in September was a 12 to 14 out of 10, but is now about an 8 out of 10 in severity.  She says she cannot stand or walk for any length of time at all and she does not get any rest at night because of the severity of the pain.  Past medical history is significant for C-section delivery.  She has had five sons.  She had a tubal ligation in 1998, hysterectomy in 2009.  She has a history of hypertension, bipolar disorder, murmur, thyroid disease and depression.    REVIEW OF SYSTEMS:   Review of Systems was reviewed with the patient.  Pertinent positives include ringing in ears, high blood pressure, heart murmur, leg pain while walking, back pain, leg pain, joint pain or swelling, arthritis, problems with memory, inability to concentrate, anxiety, depression, other psychiatric disorder/treatment, thyroid disease, anemia.    PAST MEDICAL HISTORY:      Current Medical Conditions:  As previously described.      Prior Operations and Hospitalizations:  As previously described.      Medications and Allergies:  Current medications include Bystolic 10 mg qd, Azor 5/40 mg qd, Synthroid 112 mg one every other day, Synthroid 125 mg one every other day, Clorazepate 7.5 mg bid, Seroquel XR 50 mg qd, Citalopram 20 mg qd, FiberCon qd, stool softener qd, One-A-Day for  Women qd.  DRUG ALLERGIES INCLUDE PENICILLIN, Z-PAK, ERYTHROMYCIN, CIPRO, CLINDAMYCIN, SULFA, AVELOX.      Height and Weight:  She is 5', 8 " tall and 227 lbs.  BMI is 34.0.    FAMILY HISTORY:    Mother died at age 75.  Father died at age 19.    SOCIAL HISTORY:    She smokes six cigarettes a day.  She denies a history of alcohol use.  She denies any drug abuse.  She does note that she is working on weight control and has lost 25 lbs.    DIAGNOSTIC STUDIES:   I reviewed an MRI of her lumbar spine which shows a disc extrusion at L5-S1 on the right compressing the right S1 nerve root.  Additionally, I went over her lumbar radiographs which show that she has lumbar scoliosis and mild levoconvex scoliosis with an apex of L3-4.   PHYSICAL EXAMINATION:      General Appearance:  On examination today [name] is a pleasant, cooperative, obese woman in no acute distress.      Blood Pressure, Pulse and Respiratory Rate:  Her blood pressure is 138/78.  Heart rate is 74 and regular.  Respiratory rate is 18.       HEENT - normocephalic, atraumatic.  The pupils are equal, round and reactive to light.  The extraocular  muscles are intact.  Sclerae - white.  Conjunctiva - pink.  Oropharynx benign.  Uvula midline.     Neck - there are no masses, meningismus, deformities, tracheal deviation, jugular vein distention or carotid bruits.  There is normal cervical range of motion.  Spurlings' test is negative without reproducible radicular pain turning the patient's head to either side.  Lhermitte's sign is not present with axial compression.      Respiratory - there is normal respiratory effort with good intercostal function.  Lungs are clear to auscultation.  There are no rales, rhonchi or wheezes.      Cardiovascular - the heart has regular rate and rhythm to auscultation.  No murmurs are appreciated.  There is no extremity edema, cyanosis or clubbing.  There are palpable pedal pulses.      Abdomen - obese,  soft, nontender, no hepatosplenomegaly appreciated or masses.  There are active bowel sounds.  No guarding or rebound.      Musculoskeletal Examination - she has pain at the lumbosacral junction, in the right buttock and right sciatic notch discomfort to palpation.  She is able to stand on her heels and toes.  She has decreased ability to stand on her toes on the right.  She has positive straight leg raise at 15 degrees on the right.    NEUROLOGICAL EXAMINATION: The patient is oriented to time, person and place and has good recall of both recent and remote memory with normal attention span and concentration.    The patient speaks with clear and fluent speech and exhibits normal language function and appropriate fund of knowledge.      Cranial Nerve Examination - pupils are equal, round and reactive to light.  Extraocular movements are full.  Visual fields are full to confrontational testing.  Facial sensation and facial movement are symmetric and intact.  Hearing is intact to finger rub.  Palate is upgoing.  Shoulder shrug is symmetric.  Tongue protrudes in the midline.      Motor Examination - motor strength is 5/5 in the bilateral deltoids, biceps, triceps, handgrips, wrist extensors, interosseous.  In the lower extremities motor strength is 5/5 in hip flexion, extension, quadriceps, hamstrings, plantar flexion, dorsiflexion and extensor hallucis longus.      Sensory Examination - normal with the exception of increased pin sensation in the right S1 distribution.       Deep Tendon Reflexes - 2 in the biceps, triceps and brachioradialis, 2 at the knees, absent at the right ankle, 2 at the left ankle.  Great toes are downgoing to plantar stimulation.      Cerebellar Examination - normal coordination in upper and lower extremities and normal rapid alternating movements.  Romberg test is negative.    IMPRESSION AND RECOMMENDATIONS:    [name] is a 50 year old woman with a large disc herniation  at L5-S1 on the right.  She has significant right leg pain and weakness with decreased ability to perform plantar flexion and absent ankle reflex.    At this point, I have recommended to Ms. Cogliano that she undergo right L5-S1 microdiskectomy.  I believe this will be the most rapid way for her to get relief of her leg pain.  She wishes to proceed with surgery.  Risks and benefits were discussed with the patient.  She was also advised of the importance of weight control and also stopping smoking.  The patient wishes to proceed with surgery.  This has been scheduled for 08/22/2011.  She was  prescribed Hydrocodone 5/325 one every 6 hours as needed for pain, dispensed #60.    [facility]    Danae Orleans. Venetia Maxon, M.D.  JDS:sv cc: Dr. Coral Ceo  Dr. Donia Guiles

## 2011-08-22 NOTE — Transfer of Care (Signed)
Immediate Anesthesia Transfer of Care Note  Patient: [name]  Procedure(s) Performed:  MICRODISCECTOMY LUMBAR LAMINECTOMY - RIGHT Lumbar Five-Sacral One microdiskectomy  Patient Location: PACU  Anesthesia Type: General  Level of Consciousness: awake  Airway & Oxygen Therapy: Patient Spontanous Breathing and Patient connected to nasal cannula oxygen  Post-op Assessment: Report given to PACU RN  Post vital signs: stable  Complications: No apparent anesthesia complications

## 2011-08-22 NOTE — Anesthesia Preprocedure Evaluation (Addendum)
Anesthesia Evaluation  Patient identified by MRN, date of birth, ID band Patient awake    Reviewed: Allergy & Precautions, H&P , NPO status , Patient's Chart, lab work & pertinent test results, reviewed documented beta blocker date and time   History of Anesthesia Complications (+) PROLONGED EMERGENCE  Airway Mallampati: I TM Distance: >3 FB Neck ROM: Full    Dental  (+) Teeth Intact and Poor Dentition   Pulmonary          Cardiovascular hypertension, Pt. on medications and Pt. on home beta blockers     Neuro/Psych PSYCHIATRIC DISORDERS Anxiety Depression    GI/Hepatic   Endo/Other  Hypothyroidism   Renal/GU      Musculoskeletal   Abdominal   Peds  Hematology   Anesthesia Other Findings   Reproductive/Obstetrics                         Anesthesia Physical Anesthesia Plan  ASA: II  Anesthesia Plan: General ETT   Post-op Pain Management:    Induction:   Airway Management Planned:   Additional Equipment:   Intra-op Plan:   Post-operative Plan:   Informed Consent: I have reviewed the patients History and Physical, chart, labs and discussed the procedure including the risks, benefits and alternatives for the proposed anesthesia with the patient or authorized representative who has indicated his/her understanding and acceptance.     Plan Discussed with: CRNA and Surgeon  Anesthesia Plan Comments:         Anesthesia Quick Evaluation

## 2011-08-22 NOTE — Interval H&P Note (Signed)
History and Physical Interval Note:  08/22/2011 3:27 PM  [name]  has presented today for surgery, with the diagnosis of lumbar herniated disc lumbar spondylosis lumbar degenerative disc disease    The various methods of treatment have been discussed with the patient and family. After consideration of risks, benefits and other options for treatment, the patient has consented to  Procedure(s): MICRODISCECTOMY LUMBAR LAMINECTOMY as a surgical intervention .  The patients' history has been reviewed, patient examined, no change in status, stable for surgery.  I have reviewed the patients' chart and labs.  Questions were answered to the patient's satisfaction.     [name] D  Date of Initial H&P: 08/11/2011  History reviewed, patient examined, no change in status, stable for surgery.

## 2011-08-22 NOTE — Anesthesia Procedure Notes (Signed)
Procedure Name: Intubation Date/Time: 08/22/2011 3:54 PM Performed by: Alanda Amass Pre-anesthesia Checklist: Patient identified, Timeout performed, Emergency Drugs available, Suction available and Patient being monitored Patient Re-evaluated:Patient Re-evaluated prior to inductionOxygen Delivery Method: Circle System Utilized Preoxygenation: Pre-oxygenation with 100% oxygen Intubation Type: IV induction Ventilation: Mask ventilation without difficulty Laryngoscope Size: 3 and Mac Grade View: Grade II Tube type: Oral Tube size: 7.5 mm Number of attempts: 1 Placement Confirmation: ETT inserted through vocal cords under direct vision,  positive ETCO2 and breath sounds checked- equal and bilateral Secured at: 21 cm Tube secured with: Tape Dental Injury: Teeth and Oropharynx as per pre-operative assessment

## 2011-08-22 NOTE — Op Note (Signed)
08/22/2011  5:28 PM  PATIENT:  [name]  50 y.o. female  PRE-OPERATIVE DIAGNOSIS:  lumbar herniated disc lumbar spondylosis lumbar degenerative disc disease lumbar radiculopathy R L5/S1    POST-OPERATIVE DIAGNOSIS:  lumbar herniated disc lumbar spondylosis lumbar degenerative disc disease lumbar radiculopathy R L5/S1  PROCEDURE:  Procedure(s): MICRODISCECTOMY LUMBAR LAMINECTOMY R L5/S1 with Microdissection  SURGEON:  Surgeon(s): Dorian Heckle, MD Hewitt Shorts  PHYSICIAN ASSISTANT:   ASSISTANTS: Poteat, RN   ANESTHESIA:   general  EBL:  Total I/O In: 1000 [I.V.:1000] Out: -   BLOOD ADMINISTERED:none  DRAINS: none   LOCAL MEDICATIONS USED:  LIDOCAINE 10CC  SPECIMEN:  No Specimen  DISPOSITION OF SPECIMEN:  N/A  COUNTS:  YES  TOURNIQUET:  * No tourniquets in log *  DICTATION: Patient was brought to the operating room and following the satisfactory and uncomplicated induction of general endotracheal anesthesia the patient was placed in a prone position on the operating table on the Wilson frame. Her low back was prepped and draped in usual sterile fashion with DuraPrep. Area of planned incision was infiltrated with local lidocaine an incision was made and carried through 2 inches of adipose tissue to the lumbodorsal fascia which was incised sharply. Subperiosteal dissection was performed exposing the L5-S1 interspace on the right. A hemi-semi-laminectomy of L5 was performed a high-speed drill after confirmatory radiograph with marker probes identified the correct level. A foraminotomy was performed overlying the S1 nerve root. Using the operating microscope the S1 nerve root was mobilized medially exposing a large disc herniation. The annulus was incised and multiple fragments of disc material were removed. The interspace was cleared of residual disc material and the remaining spondylitic ridges were taken down with osteophyte tool. The interspace was further  cleared of residual disc material hemostasis was assured. The wound is then Depo-Medrol and fentanyl. The fascia was closed with 0 Vicryl sutures subcutaneous tissues reapproximated 2-0 Vicryl interrupted inverted sutures and the skin edges were reapproximated with 3-0 Vicryl subcuticular stitch. Wound is dressed with Dermabond.  Patient was extubated in the operating room and taken to recovery in stable and satisfactory condition having tolerated operation well counts were correct in the case.  PLAN OF CARE: Admit for overnight observation  PATIENT DISPOSITION:  PACU - hemodynamically stable.   Delay start of Pharmacological VTE agent (>24hrs) due to surgical blood loss or risk of bleeding:  YES

## 2011-08-22 NOTE — Interval H&P Note (Signed)
History and Physical Interval Note:  08/22/2011 10:43 AM  [name]  has presented today for surgery, with the diagnosis of lumbar herniated disc lumbar spondylosis lumbar degenerative disc disease    The various methods of treatment have been discussed with the patient and family. After consideration of risks, benefits and other options for treatment, the patient has consented to  Procedure(s): MICRODISCECTOMY LUMBAR LAMINECTOMY as a surgical intervention .  The patients' history has been reviewed, patient examined, no change in status, stable for surgery.  I have reviewed the patients' chart and labs.  Questions were answered to the patient's satisfaction.     Ailen Strauch D  Date of Initial H&P: 08/11/2011  History reviewed, patient examined, no change in status, stable for surgery.

## 2011-08-22 NOTE — Anesthesia Postprocedure Evaluation (Signed)
  Anesthesia Post-op Note  Patient: [name]  Procedure(s) Performed:  MICRODISCECTOMY LUMBAR LAMINECTOMY - RIGHT Lumbar Five-Sacral One microdiskectomy  Patient Location: PACU  Anesthesia Type: General  Level of Consciousness: awake and alert   Airway and Oxygen Therapy: Patient Spontanous Breathing  Post-op Pain: mild  Post-op Assessment: Post-op Vital signs reviewed, Patient's Cardiovascular Status Stable, Respiratory Function Stable, Patent Airway, No signs of Nausea or vomiting and Pain level controlled  Post-op Vital Signs: Reviewed and stable  Complications: No apparent anesthesia complications

## 2011-08-23 ENCOUNTER — Encounter: Payer: Medicaid Other | Admitting: Physical Therapy

## 2011-08-23 ENCOUNTER — Encounter (HOSPITAL_COMMUNITY): Payer: Self-pay | Admitting: Neurosurgery

## 2011-08-23 MED ORDER — HYDROCODONE-ACETAMINOPHEN 5-325 MG PO TABS: 1.0000 | ORAL_TABLET | ORAL | Status: AC | PRN

## 2011-08-23 MED ORDER — DIAZEPAM 5 MG PO TABS: 5.0000 mg | ORAL_TABLET | Freq: Four times a day (QID) | ORAL | Status: AC | PRN

## 2011-08-23 MED FILL — Mupirocin Oint 2%: CUTANEOUS | Qty: 22 | Status: AC

## 2011-08-23 NOTE — Progress Notes (Signed)
CSW received consult for "SNF." Pt was discharge prior to assessment. No further needs identified. CSW signing off.   Dede Query, MSW, Theresia Majors [phone]

## 2011-08-23 NOTE — Progress Notes (Signed)
08/23/11  7:41 PT Note: Noted new PT order and PT discontinue.  PT evaluation will not be completed.  Please reorder PT if needed.  Thanks.  08/23/2011 Cephus Shelling, PT, DPT [phone]

## 2011-08-23 NOTE — Progress Notes (Signed)
OT Note: Noted OT ordered and discontinued. OT eval not completed. Please reorder OT if needed. Thanks.  Glendale Chard    OTR/L Pager: [phone] 08/23/2011

## 2011-08-23 NOTE — Discharge Summary (Signed)
Physician Discharge Summary  Patient ID: [name] MRN: [id number] DOB/AGE: [birth year] 50 y.o.  Admit date: 08/22/2011 Discharge date: 08/23/2011  Admission Diagnoses:  Discharge Diagnoses:  Active Problems:  * No active hospital problems. *    Discharged Condition: good  Hospital Course: Uncomplicated Right L5/S1 Microdiscectomy  Consults: none  Significant Diagnostic Studies: None  Treatments: surgery: Uncomplicated Right L5/S1 Microdiscectomy  Discharge Exam: Blood pressure 116/68, pulse 57, temperature 97.7 F (36.5 C), temperature source Oral, resp. rate 18, SpO2 99.00%. Neurologic: Alert and oriented X 3, normal strength and tone. Normal symmetric reflexes. Normal coordination and gait Wound:C/D/I  Disposition: Home   Current Discharge Medication List    START taking these medications   Details  diazepam (VALIUM) 5 MG tablet Take 1 tablet (5 mg total) by mouth every 6 (six) hours as needed. Qty: 40 tablet, Refills: 0    HYDROcodone-acetaminophen (NORCO) 5-325 MG per tablet Take 1-2 tablets by mouth every 4 (four) hours as needed. Qty: 60 tablet, Refills: 0      CONTINUE these medications which have NOT CHANGED   Details  amLODipine-olmesartan (AZOR) 5-40 MG per tablet Take 1 tablet by mouth daily.      clorazepate (TRANXENE) 7.5 MG tablet Take 7.5 mg by mouth 2 (two) times daily.      docusate sodium (COLACE) 100 MG capsule Take 100 mg by mouth 2 (two) times daily as needed. For stool softening     levothyroxine (SYNTHROID, LEVOTHROID) 125 MCG tablet Take 125 mcg by mouth daily.      nebivolol (BYSTOLIC) 10 MG tablet Take 10 mg by mouth daily.      polycarbophil (FIBERCON) 625 MG tablet Take 625 mg by mouth daily.      QUEtiapine (SEROQUEL XR) 50 MG TB24 Take 50 mg by mouth daily.           Signed: Dorian Heckle, MD 08/23/2011, 7:50 AM

## 2011-11-08 ENCOUNTER — Other Ambulatory Visit: Payer: Self-pay | Admitting: Obstetrics

## 2011-11-08 DIAGNOSIS — Z1231 Encounter for screening mammogram for malignant neoplasm of breast: Secondary | ICD-10-CM

## 2011-12-08 ENCOUNTER — Ambulatory Visit
Admission: RE | Admit: 2011-12-08 | Discharge: 2011-12-08 | Disposition: A | Discharge status: Home / Self Care | Payer: Medicaid Other | Source: Ambulatory Visit | Attending: Obstetrics | Admitting: Obstetrics

## 2011-12-08 DIAGNOSIS — Z1231 Encounter for screening mammogram for malignant neoplasm of breast: Secondary | ICD-10-CM

## 2012-11-06 ENCOUNTER — Other Ambulatory Visit: Payer: Self-pay

## 2012-11-06 DIAGNOSIS — Z1231 Encounter for screening mammogram for malignant neoplasm of breast: Secondary | ICD-10-CM

## 2012-12-11 ENCOUNTER — Ambulatory Visit: Payer: Medicaid Other

## 2013-01-14 ENCOUNTER — Ambulatory Visit
Admission: RE | Admit: 2013-01-14 | Discharge: 2013-01-14 | Disposition: A | Discharge status: Home / Self Care | Payer: Medicaid Other | Source: Ambulatory Visit

## 2013-01-14 DIAGNOSIS — Z1231 Encounter for screening mammogram for malignant neoplasm of breast: Secondary | ICD-10-CM

## 2013-03-01 ENCOUNTER — Emergency Department (HOSPITAL_COMMUNITY)
Admission: EM | Admit: 2013-03-01 | Discharge: 2013-03-01 | Disposition: A | Discharge status: Home / Self Care | Payer: Medicaid Other | Attending: Emergency Medicine | Admitting: Emergency Medicine

## 2013-03-01 ENCOUNTER — Encounter (HOSPITAL_COMMUNITY): Payer: Self-pay | Admitting: Emergency Medicine

## 2013-03-01 ENCOUNTER — Emergency Department (HOSPITAL_COMMUNITY): Payer: Medicaid Other

## 2013-03-01 DIAGNOSIS — IMO0002 Reserved for concepts with insufficient information to code with codable children: Secondary | ICD-10-CM | POA: Insufficient documentation

## 2013-03-01 DIAGNOSIS — F319 Bipolar disorder, unspecified: Secondary | ICD-10-CM | POA: Insufficient documentation

## 2013-03-01 DIAGNOSIS — R011 Cardiac murmur, unspecified: Secondary | ICD-10-CM | POA: Insufficient documentation

## 2013-03-01 DIAGNOSIS — Z8701 Personal history of pneumonia (recurrent): Secondary | ICD-10-CM | POA: Insufficient documentation

## 2013-03-01 DIAGNOSIS — S99929A Unspecified injury of unspecified foot, initial encounter: Secondary | ICD-10-CM | POA: Insufficient documentation

## 2013-03-01 DIAGNOSIS — S99921A Unspecified injury of right foot, initial encounter: Secondary | ICD-10-CM

## 2013-03-01 DIAGNOSIS — M129 Arthropathy, unspecified: Secondary | ICD-10-CM | POA: Insufficient documentation

## 2013-03-01 DIAGNOSIS — J40 Bronchitis, not specified as acute or chronic: Secondary | ICD-10-CM | POA: Insufficient documentation

## 2013-03-01 DIAGNOSIS — I10 Essential (primary) hypertension: Secondary | ICD-10-CM | POA: Insufficient documentation

## 2013-03-01 DIAGNOSIS — S8990XA Unspecified injury of unspecified lower leg, initial encounter: Secondary | ICD-10-CM | POA: Insufficient documentation

## 2013-03-01 DIAGNOSIS — I499 Cardiac arrhythmia, unspecified: Secondary | ICD-10-CM | POA: Insufficient documentation

## 2013-03-01 DIAGNOSIS — S9030XA Contusion of unspecified foot, initial encounter: Secondary | ICD-10-CM | POA: Insufficient documentation

## 2013-03-01 DIAGNOSIS — Y998 Other external cause status: Secondary | ICD-10-CM | POA: Insufficient documentation

## 2013-03-01 DIAGNOSIS — M25473 Effusion, unspecified ankle: Secondary | ICD-10-CM | POA: Insufficient documentation

## 2013-03-01 DIAGNOSIS — F411 Generalized anxiety disorder: Secondary | ICD-10-CM | POA: Insufficient documentation

## 2013-03-01 DIAGNOSIS — F17200 Nicotine dependence, unspecified, uncomplicated: Secondary | ICD-10-CM | POA: Insufficient documentation

## 2013-03-01 DIAGNOSIS — E039 Hypothyroidism, unspecified: Secondary | ICD-10-CM | POA: Insufficient documentation

## 2013-03-01 DIAGNOSIS — Z8679 Personal history of other diseases of the circulatory system: Secondary | ICD-10-CM | POA: Insufficient documentation

## 2013-03-01 DIAGNOSIS — Y92410 Unspecified street and highway as the place of occurrence of the external cause: Secondary | ICD-10-CM | POA: Insufficient documentation

## 2013-03-01 DIAGNOSIS — Z79899 Other long term (current) drug therapy: Secondary | ICD-10-CM | POA: Insufficient documentation

## 2013-03-01 DIAGNOSIS — M25476 Effusion, unspecified foot: Secondary | ICD-10-CM | POA: Insufficient documentation

## 2013-03-01 MED ORDER — NAPROXEN 500 MG PO TABS: 500.0000 mg | ORAL_TABLET | Freq: Two times a day (BID) | ORAL | Status: DC

## 2013-03-01 MED ORDER — HYDROCODONE-ACETAMINOPHEN 5-325 MG PO TABS: ORAL_TABLET | ORAL | Status: DC
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
## 2013-03-01 NOTE — ED Notes (Signed)
Patient transported to X-ray 

## 2013-03-01 NOTE — ED Provider Notes (Signed)
Medical screening examination/treatment/procedure(s) were performed by non-physician practitioner and as supervising physician I was immediately available for consultation/collaboration. Kaily Wragg, MD, FACEP   Yahmir Sokolov L Rogan Ecklund, MD 03/01/13 1532 
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
## 2013-07-27 ENCOUNTER — Emergency Department (HOSPITAL_COMMUNITY): Payer: Medicaid Other

## 2013-07-27 ENCOUNTER — Emergency Department (HOSPITAL_COMMUNITY)
Admission: EM | Admit: 2013-07-27 | Discharge: 2013-07-27 | Disposition: A | Discharge status: Home / Self Care | Payer: Medicaid Other | Attending: Emergency Medicine | Admitting: Emergency Medicine

## 2013-07-27 ENCOUNTER — Encounter (HOSPITAL_COMMUNITY): Payer: Self-pay | Admitting: Emergency Medicine

## 2013-07-27 DIAGNOSIS — F411 Generalized anxiety disorder: Secondary | ICD-10-CM | POA: Insufficient documentation

## 2013-07-27 DIAGNOSIS — Z8679 Personal history of other diseases of the circulatory system: Secondary | ICD-10-CM | POA: Insufficient documentation

## 2013-07-27 DIAGNOSIS — Z79899 Other long term (current) drug therapy: Secondary | ICD-10-CM | POA: Insufficient documentation

## 2013-07-27 DIAGNOSIS — I10 Essential (primary) hypertension: Secondary | ICD-10-CM | POA: Insufficient documentation

## 2013-07-27 DIAGNOSIS — S0003XA Contusion of scalp, initial encounter: Secondary | ICD-10-CM | POA: Insufficient documentation

## 2013-07-27 DIAGNOSIS — F3289 Other specified depressive episodes: Secondary | ICD-10-CM | POA: Insufficient documentation

## 2013-07-27 DIAGNOSIS — T148XXA Other injury of unspecified body region, initial encounter: Secondary | ICD-10-CM

## 2013-07-27 DIAGNOSIS — M549 Dorsalgia, unspecified: Secondary | ICD-10-CM | POA: Insufficient documentation

## 2013-07-27 DIAGNOSIS — M129 Arthropathy, unspecified: Secondary | ICD-10-CM | POA: Insufficient documentation

## 2013-07-27 DIAGNOSIS — F329 Major depressive disorder, single episode, unspecified: Secondary | ICD-10-CM | POA: Insufficient documentation

## 2013-07-27 DIAGNOSIS — E039 Hypothyroidism, unspecified: Secondary | ICD-10-CM | POA: Insufficient documentation

## 2013-07-27 DIAGNOSIS — S060X0A Concussion without loss of consciousness, initial encounter: Secondary | ICD-10-CM | POA: Insufficient documentation

## 2013-07-27 DIAGNOSIS — Z880 Allergy status to penicillin: Secondary | ICD-10-CM | POA: Insufficient documentation

## 2013-07-27 DIAGNOSIS — F17200 Nicotine dependence, unspecified, uncomplicated: Secondary | ICD-10-CM | POA: Insufficient documentation

## 2013-07-27 MED ORDER — IBUPROFEN 200 MG PO TABS
600.0000 mg | ORAL_TABLET | Freq: Once | ORAL | Status: AC
  Administered 2013-07-27: 600 mg via ORAL
  Filled 2013-07-27: qty 3

## 2013-07-27 MED ORDER — HYDROCODONE-ACETAMINOPHEN 5-325 MG PO TABS: 1.0000 | ORAL_TABLET | Freq: Four times a day (QID) | ORAL | Status: DC | PRN

## 2013-07-27 MED ORDER — HYDROCODONE-ACETAMINOPHEN 5-325 MG PO TABS
1.0000 | ORAL_TABLET | Freq: Once | ORAL | Status: AC
  Administered 2013-07-27: 1 via ORAL
  Filled 2013-07-27: qty 1

## 2013-07-27 MED ORDER — IBUPROFEN 600 MG PO TABS: 600.0000 mg | ORAL_TABLET | Freq: Four times a day (QID) | ORAL | Status: DC | PRN

## 2013-07-27 NOTE — ED Notes (Signed)
Explained discharge paperwork to patient and her 3 sons sitting in the room with her. PT verbalizes understanding. PT rates her pain 8/10 in her neck.
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
## 2013-07-27 NOTE — ED Provider Notes (Signed)
Medical screening examination/treatment/procedure(s) were performed by non-physician practitioner and as supervising physician I was immediately available for consultation/collaboration.  EKG Interpretation   None         Gwyn Hieronymus L Emersen Mascari, MD 07/27/13 1456 
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
## 2013-08-02 ENCOUNTER — Emergency Department (HOSPITAL_COMMUNITY): Payer: Medicaid Other

## 2013-08-02 ENCOUNTER — Emergency Department (HOSPITAL_COMMUNITY)
Admission: EM | Admit: 2013-08-02 | Discharge: 2013-08-02 | Disposition: A | Discharge status: Home / Self Care | Payer: Medicaid Other | Attending: Emergency Medicine | Admitting: Emergency Medicine

## 2013-08-02 ENCOUNTER — Encounter (HOSPITAL_COMMUNITY): Payer: Self-pay | Admitting: Emergency Medicine

## 2013-08-02 DIAGNOSIS — R011 Cardiac murmur, unspecified: Secondary | ICD-10-CM | POA: Insufficient documentation

## 2013-08-02 DIAGNOSIS — E039 Hypothyroidism, unspecified: Secondary | ICD-10-CM | POA: Insufficient documentation

## 2013-08-02 DIAGNOSIS — Z8701 Personal history of pneumonia (recurrent): Secondary | ICD-10-CM | POA: Insufficient documentation

## 2013-08-02 DIAGNOSIS — F17200 Nicotine dependence, unspecified, uncomplicated: Secondary | ICD-10-CM | POA: Insufficient documentation

## 2013-08-02 DIAGNOSIS — I10 Essential (primary) hypertension: Secondary | ICD-10-CM | POA: Insufficient documentation

## 2013-08-02 DIAGNOSIS — F411 Generalized anxiety disorder: Secondary | ICD-10-CM | POA: Insufficient documentation

## 2013-08-02 DIAGNOSIS — Z880 Allergy status to penicillin: Secondary | ICD-10-CM | POA: Insufficient documentation

## 2013-08-02 DIAGNOSIS — Z79899 Other long term (current) drug therapy: Secondary | ICD-10-CM | POA: Insufficient documentation

## 2013-08-02 DIAGNOSIS — Z862 Personal history of diseases of the blood and blood-forming organs and certain disorders involving the immune mechanism: Secondary | ICD-10-CM | POA: Insufficient documentation

## 2013-08-02 DIAGNOSIS — J22 Unspecified acute lower respiratory infection: Secondary | ICD-10-CM

## 2013-08-02 DIAGNOSIS — R0989 Other specified symptoms and signs involving the circulatory and respiratory systems: Secondary | ICD-10-CM | POA: Insufficient documentation

## 2013-08-02 DIAGNOSIS — F319 Bipolar disorder, unspecified: Secondary | ICD-10-CM | POA: Insufficient documentation

## 2013-08-02 DIAGNOSIS — M129 Arthropathy, unspecified: Secondary | ICD-10-CM | POA: Insufficient documentation

## 2013-08-02 MED ORDER — HYDROCODONE-HOMATROPINE 5-1.5 MG/5ML PO SYRP: 5.0000 mL | ORAL_SOLUTION | Freq: Four times a day (QID) | ORAL | Status: DC | PRN

## 2013-08-02 MED ORDER — ALBUTEROL SULFATE (5 MG/ML) 0.5% IN NEBU
5.0000 mg | INHALATION_SOLUTION | Freq: Once | RESPIRATORY_TRACT | Status: AC
  Administered 2013-08-02: 5 mg via RESPIRATORY_TRACT
  Filled 2013-08-02: qty 1

## 2013-08-02 MED ORDER — ALBUTEROL SULFATE HFA 108 (90 BASE) MCG/ACT IN AERS: 2.0000 | INHALATION_SPRAY | RESPIRATORY_TRACT | Status: DC | PRN

## 2013-08-02 NOTE — ED Provider Notes (Signed)
CSN: [id number]     Arrival date & time 08/02/13  1530 History  This chart was scribed for non-physician practitioner Arthor Captain, PA-C, working with Gilda Crease, MD, by Yevette Edwards, ED Scribe. This patient was seen in room WTR7/WTR7 and the patient's care was started at 5:11 PM.    First MD Initiated Contact with Patient 08/02/13 1648     Chief Complaint  Patient presents with  . Cough   The history is provided by the patient. No language interpreter was used.   HPI Comments: [name] is a 52 y.o. female, with a h/o bronchitis and pneumonia, who presents to the Emergency Department complaining of a cough productive of clear phlem which has persisted for three days along with the associated symptoms of chest congestion, a low-grade temperature of 99 F, chills, sneezing, a mild sore throat, sinus pressure, and post-tussive headaches. She denies nausea, emesis, or appetite changes.  The pt has used lemon-honey tea, ginger-ale, soup, and cough drops to address the cough, but without resolution. She is a current smoker.   Past Medical History  Diagnosis Date  . Complication of anesthesia     difficult to wake up and blood pressure drops  . Heart murmur   . Hypertension     sees Dr. Shana Chute, is patients primary  . Dysrhythmia     no medication treatment  . Bronchitis     hx  . Pneumonia     hx  . Hypothyroidism     takes synthroid  . Anemia   . Headache(784.0)   . Arthritis   . Bursitis     hx of  . Mental disorder     bipolar  . Anxiety   . Depression    Past Surgical History  Procedure Laterality Date  . Cesarean section    . Tubal ligation    . Abdominal hysterectomy      has both of ovaries  . Colonoscopy    . Lumbar laminectomy  08/22/2011    Procedure: MICRODISCECTOMY LUMBAR LAMINECTOMY;  Surgeon: Dorian Heckle, MD;  Location: [facility];  Service: Neurosurgery;  Laterality: Right;  RIGHT Lumbar Five-Sacral One microdiskectomy   Family  History  Problem Relation Age of Onset  . Anesthesia problems Mother   . Anesthesia problems Sister    History  Substance Use Topics  . Smoking status: Current Every Day Smoker -- 0.50 packs/day for 1 years    Types: Cigarettes  . Smokeless tobacco: Not on file  . Alcohol Use: No     Comment: "has not had alcohol since Jan 2001"   No OB history provided.  Review of Systems  Constitutional: Positive for fever and chills. Negative for appetite change.  HENT: Positive for congestion, sinus pressure, sneezing and sore throat.   Respiratory: Positive for cough.   Gastrointestinal: Negative for nausea and vomiting.  Neurological: Positive for headaches.  All other systems reviewed and are negative.   Allergies  Penicillins; Sulfa antibiotics; Ciprofloxacin hcl; Clindamycin/lincomycin; Erythromycin; Other; and Zithromax  Home Medications   Current Outpatient Rx  Name  Route  Sig  Dispense  Refill  . amLODipine-benazepril (LOTREL) 5-20 MG per capsule   Oral   Take 1 capsule by mouth daily.         Marland Kitchen atenolol (TENORMIN) 50 MG tablet   Oral   Take 50 mg by mouth daily.         . clorazepate (TRANXENE) 7.5 MG tablet   Oral  Take 7.5 mg by mouth 2 (two) times daily.           Marland Kitchen docusate sodium (COLACE) 100 MG capsule   Oral   Take 100 mg by mouth 2 (two) times daily as needed for constipation.          Marland Kitchen HYDROcodone-acetaminophen (NORCO/VICODIN) 5-325 MG per tablet   Oral   Take 1 tablet by mouth every 6 (six) hours as needed.   15 tablet   0   . ibuprofen (ADVIL,MOTRIN) 600 MG tablet   Oral   Take 1 tablet (600 mg total) by mouth every 6 (six) hours as needed.   30 tablet   0   . levothyroxine (SYNTHROID, LEVOTHROID) 125 MCG tablet   Oral   Take 125 mcg by mouth daily.           . polycarbophil (FIBERCON) 625 MG tablet   Oral   Take 625 mg by mouth daily.           . QUEtiapine (SEROQUEL XR) 50 MG TB24   Oral   Take 50 mg by mouth at bedtime.           Marland Kitchen VITAMIN D, CHOLECALCIFEROL, PO   Oral   Take 1 tablet by mouth daily.          Triage Vitals: BP 136/75  Pulse 73  Temp(Src) 98.3 F (36.8 C) (Oral)  Resp 14  SpO2 98%  Physical Exam  Nursing note and vitals reviewed. Constitutional: She is oriented to person, place, and time. She appears well-developed and well-nourished. No distress.  HENT:  Head: Normocephalic and atraumatic.  Eyes: EOM are normal.  Neck: Neck supple. No tracheal deviation present.  Cardiovascular: Normal rate, regular rhythm and normal heart sounds.   Pulmonary/Chest: Effort normal. No respiratory distress. She has wheezes.  Tight bronchitic cough with slight expiratory wheeze.   Musculoskeletal: Normal range of motion.  Neurological: She is alert and oriented to person, place, and time.  Skin: Skin is warm and dry.  Psychiatric: She has a normal mood and affect. Her behavior is normal.    ED Course  Procedures (including critical care time)  DIAGNOSTIC STUDIES: Oxygen Saturation is 98% on room air, normal by my interpretation.    COORDINATION OF CARE:  5:19 PM- Discussed treatment plan with patient which includes a breathing treatment, and the patient agreed to the plan.   Labs Review Labs Reviewed - No data to display Imaging Review Dg Chest 2 View  08/02/2013   CLINICAL DATA:  Cough and congestion.  EXAM: CHEST  2 VIEW  COMPARISON:  08/22/2011.  FINDINGS: The heart size and mediastinal contours are within normal limits. Both lungs are clear. The visualized skeletal structures are unremarkable.  IMPRESSION: No active cardiopulmonary disease.   Electronically Signed   By: Maisie Fus  Register   On: 08/02/2013 16:33    EKG Interpretation   None       MDM   1. Chest cold    Pt CXR negative for acute infiltrate. Patients symptoms are consistent with Bronchitis/RAD, likely viral etiology. Discussed that antibiotics are not indicated for viral infections. Pt will be discharged with  symptomatic treatment.  Verbalizes understanding and is agreeable with plan. Pt is hemodynamically stable & in NAD prior to dc.   I personally performed the services described in this documentation, which was scribed in my presence. The recorded information has been reviewed and is accurate.     Arthor Captain, PA-C 08/03/13 [phone]

## 2013-08-02 NOTE — ED Notes (Signed)
Patient states that she has a cough and chest congestion

## 2013-08-04 NOTE — ED Provider Notes (Signed)
Medical screening examination/treatment/procedure(s) were performed by non-physician practitioner and as supervising physician I was immediately available for consultation/collaboration.  EKG Interpretation   None         Christopher J. Pollina, MD 08/04/13 1628 
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
## 2013-12-29 ENCOUNTER — Other Ambulatory Visit: Payer: Self-pay

## 2013-12-29 DIAGNOSIS — Z1231 Encounter for screening mammogram for malignant neoplasm of breast: Secondary | ICD-10-CM

## 2014-01-15 ENCOUNTER — Ambulatory Visit: Payer: Medicaid Other

## 2014-04-03 ENCOUNTER — Emergency Department (HOSPITAL_COMMUNITY)
Admission: EM | Admit: 2014-04-03 | Discharge: 2014-04-03 | Disposition: A | Discharge status: Home / Self Care | Payer: Medicaid Other | Attending: Emergency Medicine | Admitting: Emergency Medicine

## 2014-04-03 ENCOUNTER — Encounter (HOSPITAL_COMMUNITY): Payer: Self-pay | Admitting: Emergency Medicine

## 2014-04-03 DIAGNOSIS — F17200 Nicotine dependence, unspecified, uncomplicated: Secondary | ICD-10-CM | POA: Diagnosis not present

## 2014-04-03 DIAGNOSIS — Z8701 Personal history of pneumonia (recurrent): Secondary | ICD-10-CM | POA: Insufficient documentation

## 2014-04-03 DIAGNOSIS — R221 Localized swelling, mass and lump, neck: Secondary | ICD-10-CM

## 2014-04-03 DIAGNOSIS — T4995XA Adverse effect of unspecified topical agent, initial encounter: Secondary | ICD-10-CM | POA: Diagnosis not present

## 2014-04-03 DIAGNOSIS — IMO0002 Reserved for concepts with insufficient information to code with codable children: Secondary | ICD-10-CM | POA: Diagnosis not present

## 2014-04-03 DIAGNOSIS — F411 Generalized anxiety disorder: Secondary | ICD-10-CM | POA: Diagnosis not present

## 2014-04-03 DIAGNOSIS — F3289 Other specified depressive episodes: Secondary | ICD-10-CM | POA: Insufficient documentation

## 2014-04-03 DIAGNOSIS — E039 Hypothyroidism, unspecified: Secondary | ICD-10-CM | POA: Diagnosis not present

## 2014-04-03 DIAGNOSIS — Z880 Allergy status to penicillin: Secondary | ICD-10-CM | POA: Insufficient documentation

## 2014-04-03 DIAGNOSIS — I10 Essential (primary) hypertension: Secondary | ICD-10-CM | POA: Diagnosis not present

## 2014-04-03 DIAGNOSIS — T783XXA Angioneurotic edema, initial encounter: Secondary | ICD-10-CM | POA: Diagnosis not present

## 2014-04-03 DIAGNOSIS — Z79899 Other long term (current) drug therapy: Secondary | ICD-10-CM | POA: Diagnosis not present

## 2014-04-03 DIAGNOSIS — I499 Cardiac arrhythmia, unspecified: Secondary | ICD-10-CM | POA: Diagnosis not present

## 2014-04-03 DIAGNOSIS — F329 Major depressive disorder, single episode, unspecified: Secondary | ICD-10-CM | POA: Diagnosis not present

## 2014-04-03 DIAGNOSIS — R220 Localized swelling, mass and lump, head: Secondary | ICD-10-CM | POA: Diagnosis present

## 2014-04-03 DIAGNOSIS — M129 Arthropathy, unspecified: Secondary | ICD-10-CM | POA: Diagnosis not present

## 2014-04-03 DIAGNOSIS — R011 Cardiac murmur, unspecified: Secondary | ICD-10-CM | POA: Diagnosis not present

## 2014-04-03 DIAGNOSIS — Z8709 Personal history of other diseases of the respiratory system: Secondary | ICD-10-CM | POA: Diagnosis not present

## 2014-04-03 MED ORDER — DIPHENHYDRAMINE HCL 50 MG/ML IJ SOLN: 50.0000 mg | Freq: Once | INTRAMUSCULAR | Status: DC

## 2014-04-03 MED ORDER — FAMOTIDINE 20 MG PO TABS
20.0000 mg | ORAL_TABLET | Freq: Once | ORAL | Status: AC
  Administered 2014-04-03: 20 mg via ORAL
  Filled 2014-04-03: qty 1

## 2014-04-03 MED ORDER — RANITIDINE HCL 150 MG PO TABS: 300.0000 mg | ORAL_TABLET | Freq: Every day | ORAL | Status: DC

## 2014-04-03 MED ORDER — DEXAMETHASONE SODIUM PHOSPHATE 10 MG/ML IJ SOLN
10.0000 mg | Freq: Once | INTRAMUSCULAR | Status: AC
  Administered 2014-04-03: 10 mg via INTRAMUSCULAR
  Filled 2014-04-03: qty 1

## 2014-04-03 MED ORDER — AMLODIPINE BESYLATE 10 MG PO TABS: 10.0000 mg | ORAL_TABLET | Freq: Every day | ORAL | Status: DC

## 2014-04-03 MED ORDER — PREDNISONE 20 MG PO TABS: ORAL_TABLET | ORAL | Status: DC

## 2014-04-03 MED ORDER — DIPHENHYDRAMINE HCL 25 MG PO TABS: 25.0000 mg | ORAL_TABLET | Freq: Four times a day (QID) | ORAL | Status: DC

## 2014-04-03 MED ORDER — DIPHENHYDRAMINE HCL 25 MG PO CAPS
50.0000 mg | ORAL_CAPSULE | Freq: Once | ORAL | Status: AC
  Administered 2014-04-03: 50 mg via ORAL
  Filled 2014-04-03: qty 2

## 2014-04-03 NOTE — ED Notes (Signed)
Patient presents today with a chief complaint of angioedema that has been present today. Patient reports she woke up and noticed her lips to be swollen. Patient reports she takes amlodipine/benazapril at home for hypertension.

## 2014-04-03 NOTE — Discharge Instructions (Signed)
Your lip swelling is likely related to the Benazepril in your LOTREL medication. Stop taking this, and use Amlodipine that I have prescribed until you see your regular doctor today or tomorrow to discuss alternative therapies. Use Prednisone, Zantac, and Benadryl as directed for the next 5 days to help continue decreasing the swelling in your lip. If you develop any shortness of breath, chest pain, or increased swelling to your lips or mouth, return to the emergency department.    Angioedema Angioedema is sudden puffiness (swelling), often of the skin. It can happen:  On your face or privates (genitals).  In your belly (abdomen) or other body parts. It usually happens quickly and gets better in 1 or 2 days. It often starts at night and is found when you wake up. You may get red, itchy patches of skin (hives). Attacks can be dangerous if your breathing passages get puffy. The condition may happen only once, or it can come back at random times. It may happen for several years before it goes away for good. HOME CARE  Only take medicines as told by your doctor.  Always carry your emergency allergy medicines with you.  Wear a medical bracelet as told by your doctor.  Avoid things that you know will cause attacks (triggers). GET HELP IF:  You have another attack.  Your attacks happen more often or get worse.  The condition was passed to you by your parents and you want to have children. GET HELP RIGHT AWAY IF:   Your mouth, tongue, or lips are very puffy.  You have trouble breathing.  You have trouble swallowing.  You pass out (faint). MAKE SURE YOU:   Understand these instructions.  Will watch your condition.  Will get help right away if you are not doing well or get worse. Document Released: 08/09/2009 Document Revised: 06/11/2013 Document Reviewed: 04/14/2013 [facility] Patient Information 2015 [employer], [place]. This information is not intended to replace advice given to you by  your health care provider. Make sure you discuss any questions you have with your health care provider.

## 2014-04-03 NOTE — ED Provider Notes (Signed)
CSN: [id number]     Arrival date & time 04/03/14  1311 History   First MD Initiated Contact with Patient 04/03/14 1428   This chart was scribed for non-physician practitioner Casandra Dallaire Camprubi- Soms, PA-C, working with Audree Camel, MD by Gwenevere Abbot, ED scribe. This patient was seen in room WTR8/WTR8 and the patient's care was started at 3:10 PM.    Chief Complaint  Patient presents with  . Facial Swelling   Patient is a 53 y.o. female presenting with allergic reaction. The history is provided by the patient. No language interpreter was used.  Allergic Reaction Presenting symptoms: swelling (lip)   Presenting symptoms: no difficulty breathing, no difficulty swallowing, no itching, no rash and no wheezing   Severity:  Moderate Prior allergic episodes:  No prior episodes Context: medications (stable dose of Amlodipine-Benazepril x4 yrs)   Relieved by:  None tried Worsened by:  Nothing tried Ineffective treatments:  None tried  HPI Comments:  [name] is a 53 y.o. female with a PMHx of hypothyroidism, HTN, and benign heart murmur who presents to the Emergency Department complaining of facial swelling. Pt states that she noticed significant swelling to her upper lip when she awoke this morning. Pt states that she used a new coconut body wash on yesterday evening, but denies any itching/rashes or swelling to other parts of her body. Pt denies that she has consumed any new foods recently. Pt denies swelling to any other regions of the body. Pt reports a h/o of sinus infections that can cause swelling, and intermittent tooth pain, but pt does not believe this is related and is not currently having these symptoms. Pt reports that her breathing and swallowing have been normal. Pt denies tongue swelling, CP, SOB, abd pain, nausea, vomiting, headache, or blurred vision. Pt states that she is currently taking Levothyroxine, Atenolol, Lotrel (amlodipine-benazepril) for 54yrs, stable dose. Pt  reports that her PCP is [facility].   Past Medical History  Diagnosis Date  . Complication of anesthesia     difficult to wake up and blood pressure drops  . Heart murmur   . Hypertension     sees Dr. Shana Chute, is patients primary  . Dysrhythmia     no medication treatment  . Bronchitis     hx  . Pneumonia     hx  . Hypothyroidism     takes synthroid  . Anemia   . Headache(784.0)   . Arthritis   . Bursitis     hx of  . Mental disorder     bipolar  . Anxiety   . Depression    Past Surgical History  Procedure Laterality Date  . Cesarean section    . Tubal ligation    . Abdominal hysterectomy      has both of ovaries  . Colonoscopy    . Lumbar laminectomy  08/22/2011    Procedure: MICRODISCECTOMY LUMBAR LAMINECTOMY;  Surgeon: Dorian Heckle, MD;  Location: [facility];  Service: Neurosurgery;  Laterality: Right;  RIGHT Lumbar Five-Sacral One microdiskectomy   Family History  Problem Relation Age of Onset  . Anesthesia problems Mother   . Anesthesia problems Sister    History  Substance Use Topics  . Smoking status: Current Every Day Smoker -- 0.50 packs/day for 1 years    Types: Cigarettes  . Smokeless tobacco: Not on file  . Alcohol Use: No     Comment: "has not had alcohol since Jan 2001"   OB History  Grav Para Term Preterm Abortions TAB SAB Ect Mult Living                 Review of Systems  Constitutional: Negative for fever and chills.  HENT: Positive for facial swelling (lip). Negative for congestion, drooling, rhinorrhea, sinus pressure and trouble swallowing.        Facial Swelling  Eyes: Negative for visual disturbance.  Respiratory: Negative for cough, choking, chest tightness, shortness of breath, wheezing and stridor.   Cardiovascular: Negative for chest pain.  Gastrointestinal: Negative for nausea, vomiting and abdominal pain.  Musculoskeletal: Negative for myalgias.  Skin: Negative for itching and rash.  Neurological: Negative for  dizziness, syncope, weakness, light-headedness and headaches.  10 Systems reviewed and are negative for acute change except as noted in the HPI.     Allergies  Penicillins; Sulfa antibiotics; Ciprofloxacin hcl; Clindamycin/lincomycin; Erythromycin; Other; and Zithromax  Home Medications   Prior to Admission medications   Medication Sig Start Date End Date Taking? Authorizing Provider  albuterol (PROVENTIL HFA;VENTOLIN HFA) 108 (90 BASE) MCG/ACT inhaler Inhale 2 puffs into the lungs every 4 (four) hours as needed for wheezing or shortness of breath. 08/02/13  Yes Abigail Harris, PA-C  atenolol (TENORMIN) 50 MG tablet Take 50 mg by mouth daily.   Yes Historical Provider, MD  clorazepate (TRANXENE) 7.5 MG tablet Take 7.5 mg by mouth 2 (two) times daily.     Yes Historical Provider, MD  docusate sodium (COLACE) 100 MG capsule Take 100 mg by mouth 2 (two) times daily.    Yes Historical Provider, MD  levothyroxine (SYNTHROID, LEVOTHROID) 125 MCG tablet Take 125 mcg by mouth daily.     Yes Historical Provider, MD  polycarbophil (FIBERCON) 625 MG tablet Take 625 mg by mouth daily.     Yes Historical Provider, MD  QUEtiapine (SEROQUEL XR) 50 MG TB24 Take 50 mg by mouth at bedtime.    Yes Historical Provider, MD  triamcinolone ointment (KENALOG) 0.1 % Apply 1 application topically daily. All over.   Yes Historical Provider, MD  VITAMIN D, CHOLECALCIFEROL, PO Take 1 tablet by mouth daily.   Yes Historical Provider, MD  amLODipine (NORVASC) 10 MG tablet Take 1 tablet (10 mg total) by mouth daily. Take this instead of your LOTREL until you see your regular doctor. 04/03/14   Addaline Peplinski Strupp Camprubi-Soms, PA-C  diphenhydrAMINE (BENADRYL) 25 MG tablet Take 1 tablet (25 mg total) by mouth every 6 (six) hours. 04/03/14   Tareva Leske Strupp Camprubi-Soms, PA-C  predniSONE (DELTASONE) 20 MG tablet 3 tabs po day one, then 2 tabs daily x 4 days 04/03/14   Donnita Falls Camprubi-Soms, PA-C  ranitidine (ZANTAC) 150  MG tablet Take 2 tablets (300 mg total) by mouth at bedtime. x5 days 04/03/14   Donnita Falls Camprubi-Soms, PA-C   BP 155/71  Pulse 65  Temp(Src) 98.4 F (36.9 C) (Oral)  Resp 18  SpO2 100% Physical Exam  Nursing note and vitals reviewed. Constitutional: She is oriented to person, place, and time. Vital signs are normal. She appears well-developed and well-nourished. No distress.  NAD, oxygenating well on RA, VSS  HENT:  Head: Normocephalic and atraumatic.  Nose: Nose normal.  Mouth/Throat: Uvula is midline, oropharynx is clear and moist and mucous membranes are normal. No trismus in the jaw. No uvula swelling.  [place]/AT, ears and nose clear bilaterally. Oral mucosa moist with no tongue or pharyngeal swelling, no erythema of buccal mucosa, no trismus or uvular swelling or deviation. Upper lip diffusely swollen,  nonTTP with no erythema.  Eyes: Conjunctivae and EOM are normal.  Neck: Normal range of motion. Neck supple.  Cardiovascular: Normal rate, regular rhythm, S1 normal, S2 normal and intact distal pulses.  PMI is not displaced.  Exam reveals no gallop and no friction rub.   Murmur heard.  Crescendo systolic murmur is present with a grade of 2/6  RRR, nl s1/s2 with systolic murmur heard over pulmonic area, no palpable thrill. Distal pulses intact.  Pulmonary/Chest: Effort normal and breath sounds normal. No respiratory distress. She has no decreased breath sounds. She has no wheezes. She has no rhonchi. She has no rales.  CTAB in all lung fields, no resp distress or accessory muscle usage. No wheezes/rhonchi/rales  Abdominal: Normal appearance. She exhibits no distension.  Musculoskeletal: Normal range of motion.  Neurological: She is alert and oriented to person, place, and time.  Skin: Skin is warm, dry and intact. No rash noted. No erythema.  Upper lip swelling, nonTTP, no erythema.  Psychiatric: She has a normal mood and affect. Her behavior is normal.    ED Course   Procedures   DIAGNOSTIC STUDIES: Oxygen Saturation is 100% on RA, normal by my interpretation.  COORDINATION OF CARE: 3:23 PM-Discussed treatment plan which includes discussion with PCP regarding changing HTN medication, starting on Amlodipine until then, and using prednisone, zantac, and benadryl with pt at bedside and pt agreed to plan.   EKG Interpretation None      MDM   Final diagnoses:  Angioedema, initial encounter   Pt with angioedema, likely related to ACEI medication, oxygenating well with no tongue or airway swelling/compromise. Vital signs stable. Given Decadron 10 mg IM, Pepcid 20 mg, and Benadryl 50 mg with mild improvement of the swelling prior to the examination. Discussed discontinuing this medication, and we'll replace with Norvasc until she is seen by her primary care doctor for further evaluation. Will start prednisone with taper, Benadryl, and Zantac for 5 days to continue helping with lip swelling. I explained the diagnosis and have given explicit precautions to return to the ER including for any other new or worsening symptoms. The patient understands and accepts the medical plan as it's been dictated and I have answered their questions. Discharge instructions concerning home care and prescriptions have been given. The patient is STABLE and is discharged to home in good condition.  I personally performed the services described in this documentation, which was scribed in my presence. The recorded information has been reviewed and is accurate.  BP 141/67  Pulse 51  Temp(Src) 98.4 F (36.9 C) (Oral)  Resp 16  SpO2 100%  Meds ordered this encounter  Medications  . dexamethasone (DECADRON) injection 10 mg    Sig:   . famotidine (PEPCID) tablet 20 mg    Sig:   . diphenhydrAMINE (BENADRYL) capsule 50 mg    Sig:   . diphenhydrAMINE (BENADRYL) 25 MG tablet    Sig: Take 1 tablet (25 mg total) by mouth every 6 (six) hours.    Dispense:  20 tablet    Refill:  0     Order Specific Question:  Supervising Provider    Answer:  Eber HongMILLER, BRIAN D [[id number]]  . predniSONE (DELTASONE) 20 MG tablet    Sig: 3 tabs po day one, then 2 tabs daily x 4 days    Dispense:  11 tablet    Refill:  0    Order Specific Question:  Supervising Provider    Answer:  Eber HongMILLER, BRIAN D [[id number]]  .  ranitidine (ZANTAC) 150 MG tablet    Sig: Take 2 tablets (300 mg total) by mouth at bedtime. x5 days    Dispense:  10 tablet    Refill:  0    Order Specific Question:  Supervising Provider    Answer:  Eber Hong D [[id number]]  . amLODipine (NORVASC) 10 MG tablet    Sig: Take 1 tablet (10 mg total) by mouth daily. Take this instead of your LOTREL until you see your regular doctor.    Dispense:  15 tablet    Refill:  0    Order Specific Question:  Supervising Provider    Answer:  Eber Hong D [[id number]]  DISCONTINUE LOTREL UNTIL SEEN BY PCP, USE NORVASC INSTEAD       Donnita Falls Camprubi-Soms, PA-C 04/03/14 1655

## 2014-04-03 NOTE — ED Notes (Signed)
Angioedema has decreased since arrival, patient continues to deny airway compromise and is sleeping soundly at this time.

## 2014-04-06 NOTE — ED Provider Notes (Signed)
Medical screening examination/treatment/procedure(s) were performed by non-physician practitioner and as supervising physician I was immediately available for consultation/collaboration.   EKG Interpretation None        Audree CamelScott T Kinte Trim, MD 04/06/14 1505

## 2014-08-11 ENCOUNTER — Emergency Department (HOSPITAL_COMMUNITY)
Admission: EM | Admit: 2014-08-11 | Discharge: 2014-08-12 | Disposition: A | Discharge status: Home / Self Care | Payer: Medicaid Other | Attending: Emergency Medicine | Admitting: Emergency Medicine

## 2014-08-11 ENCOUNTER — Encounter (HOSPITAL_COMMUNITY): Payer: Self-pay | Admitting: Emergency Medicine

## 2014-08-11 ENCOUNTER — Emergency Department (HOSPITAL_COMMUNITY): Payer: Medicaid Other

## 2014-08-11 DIAGNOSIS — R011 Cardiac murmur, unspecified: Secondary | ICD-10-CM | POA: Insufficient documentation

## 2014-08-11 DIAGNOSIS — Z8701 Personal history of pneumonia (recurrent): Secondary | ICD-10-CM | POA: Insufficient documentation

## 2014-08-11 DIAGNOSIS — R05 Cough: Secondary | ICD-10-CM

## 2014-08-11 DIAGNOSIS — F419 Anxiety disorder, unspecified: Secondary | ICD-10-CM | POA: Insufficient documentation

## 2014-08-11 DIAGNOSIS — R079 Chest pain, unspecified: Secondary | ICD-10-CM

## 2014-08-11 DIAGNOSIS — J029 Acute pharyngitis, unspecified: Secondary | ICD-10-CM | POA: Diagnosis not present

## 2014-08-11 DIAGNOSIS — R110 Nausea: Secondary | ICD-10-CM | POA: Insufficient documentation

## 2014-08-11 DIAGNOSIS — Z7952 Long term (current) use of systemic steroids: Secondary | ICD-10-CM | POA: Diagnosis not present

## 2014-08-11 DIAGNOSIS — Z880 Allergy status to penicillin: Secondary | ICD-10-CM | POA: Insufficient documentation

## 2014-08-11 DIAGNOSIS — F319 Bipolar disorder, unspecified: Secondary | ICD-10-CM | POA: Diagnosis not present

## 2014-08-11 DIAGNOSIS — Z720 Tobacco use: Secondary | ICD-10-CM | POA: Diagnosis not present

## 2014-08-11 DIAGNOSIS — Z862 Personal history of diseases of the blood and blood-forming organs and certain disorders involving the immune mechanism: Secondary | ICD-10-CM | POA: Diagnosis not present

## 2014-08-11 DIAGNOSIS — Z79899 Other long term (current) drug therapy: Secondary | ICD-10-CM | POA: Diagnosis not present

## 2014-08-11 DIAGNOSIS — E039 Hypothyroidism, unspecified: Secondary | ICD-10-CM | POA: Insufficient documentation

## 2014-08-11 DIAGNOSIS — R059 Cough, unspecified: Secondary | ICD-10-CM

## 2014-08-11 DIAGNOSIS — M719 Bursopathy, unspecified: Secondary | ICD-10-CM | POA: Insufficient documentation

## 2014-08-11 DIAGNOSIS — R0981 Nasal congestion: Secondary | ICD-10-CM

## 2014-08-11 DIAGNOSIS — I10 Essential (primary) hypertension: Secondary | ICD-10-CM | POA: Diagnosis not present

## 2014-08-11 LAB — CBC
HCT: 44 % (ref 36.0–46.0)
Hemoglobin: 14.2 g/dL (ref 12.0–15.0)
MCH: 27.7 pg (ref 26.0–34.0)
MCHC: 32.3 g/dL (ref 30.0–36.0)
MCV: 85.8 fL (ref 78.0–100.0)
Platelets: 274 10*3/uL (ref 150–400)
RBC: 5.13 MIL/uL — ABNORMAL HIGH (ref 3.87–5.11)
RDW: 14.9 % (ref 11.5–15.5)
WBC: 6.3 10*3/uL (ref 4.0–10.5)

## 2014-08-11 LAB — BASIC METABOLIC PANEL
Anion gap: 13 (ref 5–15)
BUN: 6 mg/dL (ref 6–23)
CO2: 27 mEq/L (ref 19–32)
Calcium: 10 mg/dL (ref 8.4–10.5)
Chloride: 100 mEq/L (ref 96–112)
Creatinine, Ser: 0.8 mg/dL (ref 0.50–1.10)
GFR calc Af Amer: >90 mL/min (ref >90)
GFR calc non Af Amer: 82 mL/min — ABNORMAL LOW (ref >90)
Glucose, Bld: 108 mg/dL — ABNORMAL HIGH (ref 70–99)
Potassium: 4 mEq/L (ref 3.7–5.3)
Sodium: 140 mEq/L (ref 137–147)

## 2014-08-11 LAB — URINALYSIS, ROUTINE W REFLEX MICROSCOPIC
Bilirubin Urine: NEGATIVE
Glucose, UA: NEGATIVE mg/dL
Hgb urine dipstick: NEGATIVE
Ketones, ur: NEGATIVE mg/dL
Leukocytes, UA: NEGATIVE
Nitrite: NEGATIVE
Protein, ur: NEGATIVE mg/dL
Specific Gravity, Urine: 1.005 (ref 1.005–1.030)
Urobilinogen, UA: 0.2 mg/dL (ref 0.0–1.0)
pH: 6.5 (ref 5.0–8.0)

## 2014-08-11 LAB — I-STAT TROPONIN, ED: Troponin i, poc: 0 ng/mL (ref 0.00–0.08)

## 2014-08-11 MED ORDER — SODIUM CHLORIDE 0.9 % IV BOLUS (SEPSIS)
500.0000 mL | Freq: Once | INTRAVENOUS | Status: AC
  Administered 2014-08-11: 500 mL via INTRAVENOUS

## 2014-08-11 NOTE — ED Notes (Signed)
Patient with multiple complaints, feeling presycopal and just not feeling well.  She states that she did have a moment of chest pain and now feeling jittery.  She states that she is nauseated and having some bites on her arms from something.  Patient is CAOx3.

## 2014-08-11 NOTE — ED Notes (Signed)
Ginger ale, water and saltine crackers given to patient

## 2014-08-11 NOTE — ED Provider Notes (Signed)
CSN: [id number]     Arrival date & time 08/11/14  2023 History   First MD Initiated Contact with Patient 08/11/14 2134     Chief Complaint  Patient presents with  . Cough  . Nausea     (Consider location/radiation/quality/duration/timing/severity/associated sxs/prior Treatment) HPI Comments: 53 year old female with history of pneumonia, hypothyroid, anemia, bipolar, depression presents with congestion, sore throat, ear pressure, cough, chills, lightheadedness for the past 2 days. Patient had mild shakes, denies chills. Patient denies urinary symptoms. Likely sick contacts as patient staying in a shelter. Patient denies active chest pain or shortness of breath   The history is provided by the patient.    Past Medical History  Diagnosis Date  . Complication of anesthesia     difficult to wake up and blood pressure drops  . Heart murmur   . Hypertension     sees Dr. Shana ChuteSpruill, is patients primary  . Dysrhythmia     no medication treatment  . Bronchitis     hx  . Pneumonia     hx  . Hypothyroidism     takes synthroid  . Anemia   . Headache(784.0)   . Arthritis   . Bursitis     hx of  . Mental disorder     bipolar  . Anxiety   . Depression    Past Surgical History  Procedure Laterality Date  . Cesarean section    . Tubal ligation    . Abdominal hysterectomy      has both of ovaries  . Colonoscopy    . Lumbar laminectomy  08/22/2011    Procedure: MICRODISCECTOMY LUMBAR LAMINECTOMY;  Surgeon: Dorian HeckleJoseph D Stern, MD;  Location: [facility];  Service: Neurosurgery;  Laterality: Right;  RIGHT Lumbar Five-Sacral One microdiskectomy   Family History  Problem Relation Age of Onset  . Anesthesia problems Mother   . Anesthesia problems Sister    History  Substance Use Topics  . Smoking status: Current Every Day Smoker -- 0.50 packs/day for 1 years    Types: Cigarettes  . Smokeless tobacco: Not on file  . Alcohol Use: No     Comment: "has not had alcohol since Jan 2001"    OB History    No data available     Review of Systems  Constitutional: Negative for fever and chills.  HENT: Positive for congestion and sore throat.   Eyes: Negative for visual disturbance.  Respiratory: Positive for cough. Negative for shortness of breath.   Cardiovascular: Negative for chest pain.  Gastrointestinal: Positive for nausea. Negative for vomiting and abdominal pain.  Genitourinary: Negative for dysuria and flank pain.  Musculoskeletal: Negative for back pain, neck pain and neck stiffness.  Skin: Negative for rash.  Neurological: Positive for light-headedness. Negative for headaches.      Allergies  Penicillins; Sulfa antibiotics; Ciprofloxacin hcl; Clindamycin/lincomycin; Erythromycin; Other; and Zithromax  Home Medications   Prior to Admission medications   Medication Sig Start Date End Date Taking? Authorizing Provider  albuterol (PROVENTIL HFA;VENTOLIN HFA) 108 (90 BASE) MCG/ACT inhaler Inhale 2 puffs into the lungs every 4 (four) hours as needed for wheezing or shortness of breath. 08/02/13  Yes Abigail Harris, PA-C  amLODipine (NORVASC) 10 MG tablet Take 1 tablet (10 mg total) by mouth daily. Take this instead of your LOTREL until you see your regular doctor. 04/03/14  Yes Mercedes Strupp Camprubi-Soms, PA-C  atenolol (TENORMIN) 50 MG tablet Take 50 mg by mouth daily.   Yes Historical Provider, MD  clorazepate (TRANXENE) 7.5 MG tablet Take 7.5 mg by mouth 2 (two) times daily.     Yes Historical Provider, MD  docusate sodium (COLACE) 100 MG capsule Take 100 mg by mouth daily.    Yes Historical Provider, MD  levothyroxine (SYNTHROID, LEVOTHROID) 125 MCG tablet Take 125 mcg by mouth daily.     Yes Historical Provider, MD  polycarbophil (FIBERCON) 625 MG tablet Take 625 mg by mouth daily.     Yes Historical Provider, MD  QUEtiapine (SEROQUEL XR) 50 MG TB24 Take 50 mg by mouth at bedtime.    Yes Historical Provider, MD  triamcinolone ointment (KENALOG) 0.1 % Apply  1 application topically daily. All over.   Yes Historical Provider, MD  VITAMIN D, CHOLECALCIFEROL, PO Take 1 tablet by mouth daily.   Yes Historical Provider, MD  diphenhydrAMINE (BENADRYL) 25 MG tablet Take 1 tablet (25 mg total) by mouth every 6 (six) hours. Patient not taking: Reported on 08/11/2014 04/03/14   Donnita Falls Camprubi-Soms, PA-C  predniSONE (DELTASONE) 20 MG tablet 3 tabs po day one, then 2 tabs daily x 4 days Patient not taking: Reported on 08/11/2014 04/03/14   Donnita Falls Camprubi-Soms, PA-C  ranitidine (ZANTAC) 150 MG tablet Take 2 tablets (300 mg total) by mouth at bedtime. x5 days Patient not taking: Reported on 08/11/2014 04/03/14   Donnita Falls Camprubi-Soms, PA-C   BP 152/77 mmHg  Pulse 82  Temp(Src) 98.3 F (36.8 C) (Oral)  Resp 16  Ht 5\' 8"  (1.727 m)  Wt 213 lb (96.616 kg)  BMI 32.39 kg/m2  SpO2 100% Physical Exam  Constitutional: She is oriented to person, place, and time. She appears well-developed and well-nourished.  HENT:  Head: Normocephalic and atraumatic.  Mild dry mucous membranes No trismus, uvular deviation, unilateral posterior pharyngeal edema or submandibular swelling.   Eyes: Conjunctivae are normal. Right eye exhibits no discharge. Left eye exhibits no discharge.  Neck: Normal range of motion. Neck supple. No tracheal deviation present.  Cardiovascular: Normal rate and regular rhythm.   Pulmonary/Chest: Effort normal and breath sounds normal.  Abdominal: Soft. She exhibits no distension. There is no tenderness. There is no guarding.  Musculoskeletal: She exhibits no edema.  Neurological: She is alert and oriented to person, place, and time.  Skin: Skin is warm. No rash noted.  Psychiatric: She has a normal mood and affect.  Nursing note and vitals reviewed.   ED Course  Procedures (including critical care time) Labs Review Labs Reviewed  CBC - Abnormal; Notable for the following:    RBC 5.13 (*)    All other components within  normal limits  BASIC METABOLIC PANEL - Abnormal; Notable for the following:    Glucose, Bld 108 (*)    GFR calc non Af Amer 82 (*)    All other components within normal limits  URINALYSIS, ROUTINE W REFLEX MICROSCOPIC  I-STAT TROPOININ, ED    Imaging Review Dg Chest 2 View  08/11/2014   CLINICAL DATA:  Acute chest pain.  EXAM: CHEST  2 VIEW  COMPARISON:  August 02, 2013.  FINDINGS: The heart size and mediastinal contours are within normal limits. Both lungs are clear. No pneumothorax or pleural effusion is noted. The visualized skeletal structures are unremarkable.  IMPRESSION: No acute cardiopulmonary abnormality seen.   Electronically Signed   By: Roque Lias M.D.   On: 08/11/2014 21:51     EKG Interpretation   Date/Time:  Tuesday August 11 2014 21:15:32 EST Ventricular Rate:  84 PR Interval:  178 QRS Duration: 90 QT Interval:  355 QTC Calculation: 420 R Axis:   51 Text Interpretation:  Sinus rhythm Confirmed by Jancarlos Thrun  MD, Angelika Jerrett ([id number])  on 08/11/2014 10:58:29 PM      MDM   Final diagnoses:  Cough  Sore throat  Congestion of nasal sinus   Well-appearing female with clinical viral symptoms. Vitals unremarkable mild elevated blood pressure. Blood work unremarkable. No meningismus. Chest x-ray reviewed no acute findings. Discussed close outpatient follow-up. IV fluids given Patient denies chest pain to me, reviewed nurses note. If you were given medicines take as directed.  If you are on coumadin or contraceptives realize their levels and effectiveness is altered by many different medicines.  If you have any reaction (rash, tongues swelling, other) to the medicines stop taking and see a physician.   Please follow up as directed and return to the ER or see a physician for new or worsening symptoms.  Thank you. Filed Vitals:   08/11/14 2240 08/11/14 2245 08/11/14 2300 08/11/14 2315  BP: 148/80 156/73 146/72 152/77  Pulse: 80 76 78 82  Temp:      TempSrc:      Resp: 17  16 13 16   Height:      Weight:      SpO2: 100% 100% 100% 100%     Enid SkeensJoshua M Tyishia Aune, MD 08/11/14 2356

## 2014-08-11 NOTE — ED Notes (Signed)
Patient stated that she was nauseated earlier today but that went away after having a normal BM.  Also has been having the shakes and stated that could be from her panic attacks that she has.  Stated that she may need to have some Valium to calm down the shakes.  Shakes come and go are not constant

## 2014-08-11 NOTE — Discharge Instructions (Signed)
If you were given medicines take as directed.  If you are on coumadin or contraceptives realize their levels and effectiveness is altered by many different medicines.  If you have any reaction (rash, tongues swelling, other) to the medicines stop taking and see a physician.   Please follow up as directed and return to the ER or see a physician for new or worsening symptoms.  Thank you. Filed Vitals:   08/11/14 2240 08/11/14 2245 08/11/14 2300 08/11/14 2315  BP: 148/80 156/73 146/72 152/77  Pulse: 80 76 78 82  Temp:      TempSrc:      Resp: 17 16 13 16   Height:      Weight:      SpO2: 100% 100% 100% 100%

## 2014-08-12 NOTE — ED Notes (Signed)
Discharge instructions reviewed, voiced understanding.  

## 2014-11-17 ENCOUNTER — Encounter (HOSPITAL_COMMUNITY): Payer: Self-pay

## 2014-11-17 ENCOUNTER — Emergency Department (HOSPITAL_COMMUNITY): Payer: Medicaid Other

## 2014-11-17 ENCOUNTER — Inpatient Hospital Stay (HOSPITAL_COMMUNITY)
Admission: EM | Admit: 2014-11-17 | Discharge: 2014-11-26 | DRG: 392 | Disposition: A | Discharge status: Home / Self Care | Payer: Medicaid Other | Attending: Internal Medicine | Admitting: Internal Medicine

## 2014-11-17 DIAGNOSIS — K5792 Diverticulitis of intestine, part unspecified, without perforation or abscess without bleeding: Secondary | ICD-10-CM

## 2014-11-17 DIAGNOSIS — R1030 Lower abdominal pain, unspecified: Secondary | ICD-10-CM | POA: Diagnosis present

## 2014-11-17 DIAGNOSIS — E785 Hyperlipidemia, unspecified: Secondary | ICD-10-CM | POA: Diagnosis present

## 2014-11-17 DIAGNOSIS — M549 Dorsalgia, unspecified: Secondary | ICD-10-CM | POA: Diagnosis present

## 2014-11-17 DIAGNOSIS — E039 Hypothyroidism, unspecified: Secondary | ICD-10-CM | POA: Diagnosis present

## 2014-11-17 DIAGNOSIS — F419 Anxiety disorder, unspecified: Secondary | ICD-10-CM | POA: Diagnosis present

## 2014-11-17 DIAGNOSIS — K219 Gastro-esophageal reflux disease without esophagitis: Secondary | ICD-10-CM | POA: Diagnosis present

## 2014-11-17 DIAGNOSIS — R011 Cardiac murmur, unspecified: Secondary | ICD-10-CM

## 2014-11-17 DIAGNOSIS — Z590 Homelessness: Secondary | ICD-10-CM

## 2014-11-17 DIAGNOSIS — Z800 Family history of malignant neoplasm of digestive organs: Secondary | ICD-10-CM | POA: Diagnosis not present

## 2014-11-17 DIAGNOSIS — K651 Peritoneal abscess: Secondary | ICD-10-CM | POA: Diagnosis not present

## 2014-11-17 DIAGNOSIS — Z79899 Other long term (current) drug therapy: Secondary | ICD-10-CM

## 2014-11-17 DIAGNOSIS — R109 Unspecified abdominal pain: Secondary | ICD-10-CM

## 2014-11-17 DIAGNOSIS — K5712 Diverticulitis of small intestine without perforation or abscess without bleeding: Secondary | ICD-10-CM | POA: Diagnosis not present

## 2014-11-17 DIAGNOSIS — K5713 Diverticulitis of small intestine without perforation or abscess with bleeding: Secondary | ICD-10-CM | POA: Diagnosis not present

## 2014-11-17 DIAGNOSIS — H109 Unspecified conjunctivitis: Secondary | ICD-10-CM | POA: Diagnosis present

## 2014-11-17 DIAGNOSIS — F329 Major depressive disorder, single episode, unspecified: Secondary | ICD-10-CM | POA: Diagnosis present

## 2014-11-17 DIAGNOSIS — G8929 Other chronic pain: Secondary | ICD-10-CM | POA: Diagnosis present

## 2014-11-17 DIAGNOSIS — Z880 Allergy status to penicillin: Secondary | ICD-10-CM

## 2014-11-17 DIAGNOSIS — E038 Other specified hypothyroidism: Secondary | ICD-10-CM

## 2014-11-17 DIAGNOSIS — F17210 Nicotine dependence, cigarettes, uncomplicated: Secondary | ICD-10-CM | POA: Diagnosis present

## 2014-11-17 DIAGNOSIS — D72829 Elevated white blood cell count, unspecified: Secondary | ICD-10-CM | POA: Diagnosis not present

## 2014-11-17 DIAGNOSIS — K5732 Diverticulitis of large intestine without perforation or abscess without bleeding: Secondary | ICD-10-CM

## 2014-11-17 DIAGNOSIS — K5720 Diverticulitis of large intestine with perforation and abscess without bleeding: Principal | ICD-10-CM

## 2014-11-17 DIAGNOSIS — B9689 Other specified bacterial agents as the cause of diseases classified elsewhere: Secondary | ICD-10-CM | POA: Diagnosis not present

## 2014-11-17 DIAGNOSIS — J410 Simple chronic bronchitis: Secondary | ICD-10-CM

## 2014-11-17 DIAGNOSIS — F319 Bipolar disorder, unspecified: Secondary | ICD-10-CM | POA: Diagnosis present

## 2014-11-17 DIAGNOSIS — I10 Essential (primary) hypertension: Secondary | ICD-10-CM | POA: Diagnosis present

## 2014-11-17 DIAGNOSIS — J42 Unspecified chronic bronchitis: Secondary | ICD-10-CM | POA: Insufficient documentation

## 2014-11-17 DIAGNOSIS — Z889 Allergy status to unspecified drugs, medicaments and biological substances status: Secondary | ICD-10-CM | POA: Diagnosis not present

## 2014-11-17 DIAGNOSIS — F99 Mental disorder, not otherwise specified: Secondary | ICD-10-CM

## 2014-11-17 DIAGNOSIS — K5700 Diverticulitis of small intestine with perforation and abscess without bleeding: Secondary | ICD-10-CM | POA: Diagnosis not present

## 2014-11-17 DIAGNOSIS — Z881 Allergy status to other antibiotic agents status: Secondary | ICD-10-CM

## 2014-11-17 DIAGNOSIS — F32A Depression, unspecified: Secondary | ICD-10-CM

## 2014-11-17 HISTORY — DX: Dermatitis, unspecified: L30.9

## 2014-11-17 HISTORY — DX: Unspecified chronic bronchitis: J42

## 2014-11-17 HISTORY — DX: Gastro-esophageal reflux disease without esophagitis: K21.9

## 2014-11-17 LAB — URINALYSIS, ROUTINE W REFLEX MICROSCOPIC
Bilirubin Urine: NEGATIVE
Glucose, UA: NEGATIVE mg/dL
Hgb urine dipstick: NEGATIVE
Ketones, ur: NEGATIVE mg/dL
Leukocytes, UA: NEGATIVE
Nitrite: NEGATIVE
Protein, ur: NEGATIVE mg/dL
Specific Gravity, Urine: 1.004 — ABNORMAL LOW (ref 1.005–1.030)
Urobilinogen, UA: 0.2 mg/dL (ref 0.0–1.0)
pH: 7 (ref 5.0–8.0)

## 2014-11-17 LAB — CBC WITH DIFFERENTIAL/PLATELET
Basophils Absolute: 0 10*3/uL (ref 0.0–0.1)
Basophils Relative: 0 % (ref 0–1)
Eosinophils Absolute: 0 10*3/uL (ref 0.0–0.7)
Eosinophils Relative: 0 % (ref 0–5)
HCT: 44.7 % (ref 36.0–46.0)
Hemoglobin: 14.5 g/dL (ref 12.0–15.0)
Lymphocytes Relative: 9 % — ABNORMAL LOW (ref 12–46)
Lymphs Abs: 1.2 10*3/uL (ref 0.7–4.0)
MCH: 28.9 pg (ref 26.0–34.0)
MCHC: 32.4 g/dL (ref 30.0–36.0)
MCV: 89 fL (ref 78.0–100.0)
Monocytes Absolute: 0.9 10*3/uL (ref 0.1–1.0)
Monocytes Relative: 7 % (ref 3–12)
Neutro Abs: 10.8 10*3/uL — ABNORMAL HIGH (ref 1.7–7.7)
Neutrophils Relative %: 84 % — ABNORMAL HIGH (ref 43–77)
Platelets: 266 10*3/uL (ref 150–400)
RBC: 5.02 MIL/uL (ref 3.87–5.11)
RDW: 15.1 % (ref 11.5–15.5)
WBC: 12.9 10*3/uL — ABNORMAL HIGH (ref 4.0–10.5)

## 2014-11-17 LAB — LIPASE, BLOOD: Lipase: 13 U/L (ref 11–59)

## 2014-11-17 LAB — COMPREHENSIVE METABOLIC PANEL
ALT: 14 U/L (ref 0–35)
AST: 16 U/L (ref 0–37)
Albumin: 4.2 g/dL (ref 3.5–5.2)
Alkaline Phosphatase: 108 U/L (ref 39–117)
Anion gap: 8 (ref 5–15)
BUN: 6 mg/dL (ref 6–23)
CO2: 29 mmol/L (ref 19–32)
Calcium: 9.2 mg/dL (ref 8.4–10.5)
Chloride: 100 mmol/L (ref 96–112)
Creatinine, Ser: 0.79 mg/dL (ref 0.50–1.10)
GFR calc Af Amer: >90 mL/min (ref >90)
GFR calc non Af Amer: >90 mL/min (ref >90)
Glucose, Bld: 127 mg/dL — ABNORMAL HIGH (ref 70–99)
Potassium: 4 mmol/L (ref 3.5–5.1)
Sodium: 137 mmol/L (ref 135–145)
Total Bilirubin: 1.1 mg/dL (ref 0.3–1.2)
Total Protein: 7.1 g/dL (ref 6.0–8.3)

## 2014-11-17 LAB — POC OCCULT BLOOD, ED: Fecal Occult Bld: NEGATIVE

## 2014-11-17 LAB — I-STAT CG4 LACTIC ACID, ED: Lactic Acid, Venous: 1.39 mmol/L (ref 0.5–2.0)

## 2014-11-17 MED ORDER — NAPHAZOLINE HCL 0.1 % OP SOLN
1.0000 [drp] | Freq: Four times a day (QID) | OPHTHALMIC | Status: DC | PRN
  Administered 2014-11-17 – 2014-11-20 (×3): 1 [drp] via OPHTHALMIC
  Filled 2014-11-17: qty 15

## 2014-11-17 MED ORDER — ENOXAPARIN SODIUM 60 MG/0.6ML ~~LOC~~ SOLN
50.0000 mg | SUBCUTANEOUS | Status: DC
  Administered 2014-11-17 – 2014-11-25 (×9): 50 mg via SUBCUTANEOUS
  Filled 2014-11-17 (×10): qty 0.6

## 2014-11-17 MED ORDER — DEXTROSE 5 % IV SOLN
2.0000 g | Freq: Three times a day (TID) | INTRAVENOUS | Status: DC
  Administered 2014-11-17 – 2014-11-22 (×16): 2 g via INTRAVENOUS
  Filled 2014-11-17 (×17): qty 2

## 2014-11-17 MED ORDER — SODIUM CHLORIDE 0.9 % IV SOLN
INTRAVENOUS | Status: DC
  Administered 2014-11-17 – 2014-11-18 (×3): via INTRAVENOUS

## 2014-11-17 MED ORDER — MORPHINE SULFATE 4 MG/ML IJ SOLN
4.0000 mg | Freq: Once | INTRAMUSCULAR | Status: AC
  Administered 2014-11-17: 4 mg via INTRAVENOUS
  Filled 2014-11-17: qty 1

## 2014-11-17 MED ORDER — ONDANSETRON HCL 4 MG/2ML IJ SOLN
4.0000 mg | Freq: Four times a day (QID) | INTRAMUSCULAR | Status: DC | PRN
  Administered 2014-11-18 – 2014-11-22 (×3): 4 mg via INTRAVENOUS
  Filled 2014-11-17 (×3): qty 2

## 2014-11-17 MED ORDER — SODIUM CHLORIDE 0.9 % IV SOLN
Freq: Once | INTRAVENOUS | Status: AC
  Administered 2014-11-17: 11:00:00 via INTRAVENOUS

## 2014-11-17 MED ORDER — IOHEXOL 300 MG/ML  SOLN
50.0000 mL | Freq: Once | INTRAMUSCULAR | Status: AC | PRN
  Administered 2014-11-17: 50 mL via ORAL

## 2014-11-17 MED ORDER — ALBUTEROL SULFATE (2.5 MG/3ML) 0.083% IN NEBU: 3.0000 mL | INHALATION_SOLUTION | RESPIRATORY_TRACT | Status: DC | PRN

## 2014-11-17 MED ORDER — HYDROMORPHONE HCL 1 MG/ML IJ SOLN
0.5000 mg | INTRAMUSCULAR | Status: DC | PRN
  Administered 2014-11-17 – 2014-11-22 (×16): 0.5 mg via INTRAVENOUS
  Filled 2014-11-17 (×16): qty 1

## 2014-11-17 MED ORDER — METRONIDAZOLE IN NACL 5-0.79 MG/ML-% IV SOLN: 500.0000 mg | Freq: Four times a day (QID) | INTRAVENOUS | Status: DC

## 2014-11-17 MED ORDER — CLORAZEPATE DIPOTASSIUM 7.5 MG PO TABS
7.5000 mg | ORAL_TABLET | Freq: Two times a day (BID) | ORAL | Status: DC
  Administered 2014-11-17 – 2014-11-26 (×18): 7.5 mg via ORAL
  Filled 2014-11-17 (×18): qty 1

## 2014-11-17 MED ORDER — ATENOLOL 50 MG PO TABS
50.0000 mg | ORAL_TABLET | Freq: Every day | ORAL | Status: DC
  Administered 2014-11-18 – 2014-11-26 (×9): 50 mg via ORAL
  Filled 2014-11-17 (×9): qty 1

## 2014-11-17 MED ORDER — IOHEXOL 300 MG/ML  SOLN
100.0000 mL | Freq: Once | INTRAMUSCULAR | Status: AC | PRN
  Administered 2014-11-17: 100 mL via INTRAVENOUS

## 2014-11-17 MED ORDER — ONDANSETRON HCL 4 MG PO TABS
4.0000 mg | ORAL_TABLET | Freq: Four times a day (QID) | ORAL | Status: DC | PRN
  Administered 2014-11-22: 4 mg via ORAL
  Filled 2014-11-17: qty 1

## 2014-11-17 MED ORDER — METRONIDAZOLE IN NACL 5-0.79 MG/ML-% IV SOLN
500.0000 mg | Freq: Four times a day (QID) | INTRAVENOUS | Status: DC
  Administered 2014-11-17 – 2014-11-22 (×21): 500 mg via INTRAVENOUS
  Filled 2014-11-17 (×23): qty 100

## 2014-11-17 MED ORDER — LEVOTHYROXINE SODIUM 125 MCG PO TABS
125.0000 ug | ORAL_TABLET | Freq: Every day | ORAL | Status: DC
  Administered 2014-11-18 – 2014-11-26 (×9): 125 ug via ORAL
  Filled 2014-11-17 (×12): qty 1

## 2014-11-17 MED ORDER — ONDANSETRON HCL 4 MG/2ML IJ SOLN
4.0000 mg | Freq: Once | INTRAMUSCULAR | Status: AC
  Administered 2014-11-17: 4 mg via INTRAVENOUS
  Filled 2014-11-17: qty 2

## 2014-11-17 MED ORDER — TRIAMCINOLONE ACETONIDE 0.1 % EX OINT
1.0000 "application " | TOPICAL_OINTMENT | Freq: Every day | CUTANEOUS | Status: DC
  Administered 2014-11-17 – 2014-11-26 (×10): 1 via TOPICAL
  Filled 2014-11-17: qty 15

## 2014-11-17 MED ORDER — QUETIAPINE FUMARATE ER 50 MG PO TB24
50.0000 mg | ORAL_TABLET | Freq: Every day | ORAL | Status: DC
  Administered 2014-11-17 – 2014-11-25 (×9): 50 mg via ORAL
  Filled 2014-11-17 (×10): qty 1

## 2014-11-17 MED ORDER — AMLODIPINE BESYLATE 10 MG PO TABS
10.0000 mg | ORAL_TABLET | Freq: Every day | ORAL | Status: DC
  Administered 2014-11-18 – 2014-11-26 (×9): 10 mg via ORAL
  Filled 2014-11-17 (×9): qty 1

## 2014-11-17 NOTE — Progress Notes (Signed)
ANTIBIOTIC CONSULT NOTE - INITIAL  Pharmacy Consult for Aztreonam Indication: Diverticulitis  Allergies  Allergen Reactions  . Benazepril Anaphylaxis, Hives and Itching  . Penicillins Hives    Has itching and some swelling in face  . Sulfa Antibiotics Hives    Itching and facial swelling  . Ciprofloxacin Hcl Hives    "funny sensation in gums"  . Clindamycin/Lincomycin Hives    Itching, welps  . Erythromycin Hives    itching  . Other     Pt has problems with being awaken from anesthesia, Her blood pressure drops very low  . Zithromax [Azithromycin] Hives    itching    Patient Measurements: Weight: 205 lb (92.987 kg)  Vital Signs: Temp: 99.8 F (37.7 C) (03/15 0955) Temp Source: Oral (03/15 0955) BP: 155/75 mmHg (03/15 0955) Pulse Rate: 83 (03/15 0955) Intake/Output from previous day:   Intake/Output from this shift:    Labs:  Recent Labs  11/17/14 0920  WBC 12.9*  HGB 14.5  PLT 266  CREATININE 0.79   Estimated Creatinine Clearance: 94.7 mL/min (by C-G formula based on Cr of 0.79). No results for input(s): VANCOTROUGH, VANCOPEAK, VANCORANDOM, GENTTROUGH, GENTPEAK, GENTRANDOM, TOBRATROUGH, TOBRAPEAK, TOBRARND, AMIKACINPEAK, AMIKACINTROU, AMIKACIN in the last 72 hours.   Microbiology: No results found for this or any previous visit (from the past 720 hour(s)).  Medical History: Past Medical History  Diagnosis Date  . Complication of anesthesia     difficult to wake up and blood pressure drops  . Heart murmur   . Hypertension     sees Dr. Shana ChuteSpruill, is patients primary  . Dysrhythmia     no medication treatment  . Bronchitis     hx  . Pneumonia     hx  . Hypothyroidism     takes synthroid  . Anemia   . Headache(784.0)   . Arthritis   . Bursitis     hx of  . Mental disorder     bipolar  . Anxiety   . Depression     Medications:  Anti-infectives    Start     Dose/Rate Route Frequency Ordered Stop   11/17/14 1400  aztreonam (AZACTAM) 2 g in  dextrose 5 % 50 mL IVPB     2 g 100 mL/hr over 30 Minutes Intravenous 3 times per day 11/17/14 1320     11/17/14 1315  metroNIDAZOLE (FLAGYL) IVPB 500 mg     500 mg 100 mL/hr over 60 Minutes Intravenous Every 6 hours 11/17/14 1305     11/17/14 1300  metroNIDAZOLE (FLAGYL) IVPB 500 mg  Status:  Discontinued     500 mg 100 mL/hr over 60 Minutes Intravenous Every 6 hours 11/17/14 1246 11/17/14 1255     Assessment: 54yo F with abdominal pain and rectal pain. History of diverticulitis and multiple antibiotic allergies. Flagyl is started and pharmacy is asked to dose aztreonam.  3/15 >> Flagyl >> 3/15 >> Aztreonam >>  Tmax: AF WBCs: slightly elev Renal: SCr wnl, 95CG  Goal of Therapy:  Appropriate antibiotic dosing for renal function; eradication of infection  Plan:  Aztreonam 2g IV q8h. Follow up renal fxn, culture results, and clinical course.  Charolotte Ekeom Zyion Doxtater, PharmD, pager [phone]. 11/17/2014,1:22 PM.

## 2014-11-17 NOTE — Progress Notes (Signed)
Utilization Review completed.  Anshul Meddings RN CM  

## 2014-11-17 NOTE — H&P (Signed)
[facility] History and Physical  [name] ZOX:[id number] DOB: [birth year] DOA: 11/17/2014  Referring physician:  Dutch Quint PCP:  [facility]   Chief Complaint:  Abdominal pain  HPI:  The patient is a 54 y.o. year-old female with history of HTN, dysrhythmia, hypothyroidism, bipolar disorder, depression, anxiety who presents with abdominal.  The patient was last at their baseline health yesterday. During the day she felt well, but then the night prior to admission she developed some cramping and constant bilateral lower quadrant pain with radiation to her back. The pain became severe 10 out of 10.  She has had some nausea without vomiting. Initially she thought she had to go to the bathroom and was having gas pains but she was unable to go. Overnight, she developed some yellow mucousy discharge. Since coming to the emergency department she had a very small brown stool. No obvious rectal bleeding. She denies fevers but has had some chills since last night. She denies dysuria, shortness of breath, cough.  She had a colonoscopy 5 years ago and is due for another screening colonoscopy now secondary to strong family history of colon cancer and younger sister diagnosed with stage 1 colon cancer a few years ago.    In the ER, T 99.8G, other vital signs stable, WBC 12.9.  LFTs, lipase, and UA unremarkable.  CT abd/pelvis demonstrated distal sigmoid diverticulitis and upper rectal proctitis with ill-defined fluid surrounding without well-defined abscess.  + mesenteric thickening.    Review of Systems:  General:  Denies fevers, positive chills, weight loss or gain HEENT:  Denies changes to hearing and vision, rhinorrhea, sinus congestion, sore throat, positive itchy eyes CV:  Denies chest pain and palpitations, lower extremity edema.  PULM:  Denies SOB, wheezing, cough.   GI:  Per history of present illness.   GU:  Denies dysuria, frequency, urgency ENDO:  Denies polyuria,  polydipsia.   HEME:  Denies hematemesis, blood in stools, melena, abnormal bruising or bleeding.  LYMPH:  Denies lymphadenopathy.   MSK:  Denies arthralgias, myalgias.   DERM:  Denies skin rash or ulcer.   NEURO:  Denies focal numbness, weakness, slurred speech, confusion, facial droop.  PSYCH:  Denies anxiety and depression.    Past Medical History  Diagnosis Date  . Complication of anesthesia     difficult to wake up and blood pressure drops  . Heart murmur   . Hypertension     sees Dr. Shana Chute, is patients primary  . Dysrhythmia     no medication treatment  . Chronic bronchitis   . Pneumonia     hx  . Hypothyroidism     takes synthroid  . Anemia   . Headache(784.0)   . Arthritis   . Bursitis     hx of  . Mental disorder     bipolar  . Anxiety   . Depression   . Eczema   . GERD (gastroesophageal reflux disease)    Past Surgical History  Procedure Laterality Date  . Cesarean section    . Tubal ligation    . Abdominal hysterectomy      has both of ovaries  . Colonoscopy    . Lumbar laminectomy  08/22/2011    Procedure: MICRODISCECTOMY LUMBAR LAMINECTOMY;  Surgeon: Dorian Heckle, MD;  Location: [facility];  Service: Neurosurgery;  Laterality: Right;  RIGHT Lumbar Five-Sacral One microdiskectomy   Social History:  reports that she has been smoking Cigarettes.  She has a .25  pack-year smoking history. She has never used smokeless tobacco. She reports that she drinks alcohol. She reports that she does not use illicit drugs.   Allergies  Allergen Reactions  . Benazepril Anaphylaxis, Hives and Itching  . Penicillins Hives    Has itching and some swelling in face  . Sulfa Antibiotics Hives    Itching and facial swelling  . Ciprofloxacin Hcl Hives    "funny sensation in gums"  . Clindamycin/Lincomycin Hives    Itching, welps  . Erythromycin Hives    itching  . Other     Pt has problems with being awaken from anesthesia, Her blood pressure drops very low  .  Zithromax [Azithromycin] Hives    itching    Family History  Problem Relation Age of Onset  . Anesthesia problems Mother   . Anesthesia problems Sister   . Cancer Mother     thyroid  . Cancer Maternal Aunt   . Cancer Maternal Aunt   . Colon cancer Sister      Prior to Admission medications   Medication Sig Start Date End Date Taking? Authorizing Provider  albuterol (PROVENTIL HFA;VENTOLIN HFA) 108 (90 BASE) MCG/ACT inhaler Inhale 2 puffs into the lungs every 4 (four) hours as needed for wheezing or shortness of breath. 08/02/13  Yes Abigail Harris, PA-C  amLODipine (NORVASC) 10 MG tablet Take 1 tablet (10 mg total) by mouth daily. Take this instead of your LOTREL until you see your regular doctor. 04/03/14  Yes Mercedes Camprubi-Soms, PA-C  atenolol (TENORMIN) 50 MG tablet Take 50 mg by mouth daily.   Yes Historical Provider, MD  clorazepate (TRANXENE) 7.5 MG tablet Take 7.5 mg by mouth 2 (two) times daily.     Yes Historical Provider, MD  docusate sodium (COLACE) 100 MG capsule Take 100 mg by mouth daily.    Yes Historical Provider, MD  levothyroxine (SYNTHROID, LEVOTHROID) 125 MCG tablet Take 125 mcg by mouth daily.     Yes Historical Provider, MD  polycarbophil (FIBERCON) 625 MG tablet Take 625 mg by mouth daily.     Yes Historical Provider, MD  QUEtiapine (SEROQUEL XR) 50 MG TB24 Take 50 mg by mouth at bedtime.    Yes Historical Provider, MD  triamcinolone ointment (KENALOG) 0.1 % Apply 1 application topically daily. All over.   Yes Historical Provider, MD  VITAMIN D, CHOLECALCIFEROL, PO Take 1 tablet by mouth daily.   Yes Historical Provider, MD  diphenhydrAMINE (BENADRYL) 25 MG tablet Take 1 tablet (25 mg total) by mouth every 6 (six) hours. Patient not taking: Reported on 08/11/2014 04/03/14   Mercedes Camprubi-Soms, PA-C  predniSONE (DELTASONE) 20 MG tablet 3 tabs po day one, then 2 tabs daily x 4 days Patient not taking: Reported on 08/11/2014 04/03/14   Mercedes Camprubi-Soms, PA-C   ranitidine (ZANTAC) 150 MG tablet Take 2 tablets (300 mg total) by mouth at bedtime. x5 days Patient not taking: Reported on 08/11/2014 04/03/14   Allen DerryMercedes Camprubi-Soms, PA-C   Physical Exam: Filed Vitals:   11/17/14 0955 11/17/14 1327 11/17/14 1500 11/17/14 1515  BP: 155/75 136/65 123/61   Pulse: 83 93 86   Temp: 99.8 F (37.7 C)   100.2 F (37.9 C)  TempSrc: Oral   Oral  Resp: 18 20 17    Weight:      SpO2: 96% 94% 95%      General:  Adult female, NAD  Eyes:  PERRL, anicteric, eyes injected.  ENT:  Nares clear.  OP clear, non-erythematous without plaques  or exudates.  MMM.  Neck:  Supple without TM or JVD.    Lymph:  No cervical, supraclavicular, or submandibular LAD.  Cardiovascular:  RRR, normal S1, S2, 3/6 systolic murmur at LSB and apex.  2+ pulses, warm extremities  Respiratory:  CTA bilaterally without increased WOB.  Abdomen:  NABS.  Soft, ND, ttp in bilateral lower quadrants and suprapubic area without rebound or guarding    Skin:  No rashes or focal lesions.  Musculoskeletal:  Normal bulk and tone.  Trace pitting bilateral LE edema.  Psychiatric:  A & O x 4.  Appropriate affect.  Neurologic:  CN 3-12 intact.  5/5 strength.  Sensation intact.  Labs on Admission:  Basic Metabolic Panel:  Recent Labs Lab 11/17/14 0920  NA 137  K 4.0  CL 100  CO2 29  GLUCOSE 127*  BUN 6  CREATININE 0.79  CALCIUM 9.2   Liver Function Tests:  Recent Labs Lab 11/17/14 0920  AST 16  ALT 14  ALKPHOS 108  BILITOT 1.1  PROT 7.1  ALBUMIN 4.2    Recent Labs Lab 11/17/14 0920  LIPASE 13   No results for input(s): AMMONIA in the last 168 hours. CBC:  Recent Labs Lab 11/17/14 0920  WBC 12.9*  NEUTROABS 10.8*  HGB 14.5  HCT 44.7  MCV 89.0  PLT 266   Cardiac Enzymes: No results for input(s): CKTOTAL, CKMB, CKMBINDEX, TROPONINI in the last 168 hours.  BNP (last 3 results) No results for input(s): BNP in the last 8760 hours.  ProBNP (last 3  results) No results for input(s): PROBNP in the last 8760 hours.  CBG: No results for input(s): GLUCAP in the last 168 hours.  Radiological Exams on Admission: Ct Abdomen Pelvis W Contrast  11/17/2014   CLINICAL DATA:  Abdominal and rectal pain for 1 day.  EXAM: CT ABDOMEN AND PELVIS WITH CONTRAST  TECHNIQUE: Multidetector CT imaging of the abdomen and pelvis was performed using the standard protocol following bolus administration of intravenous contrast. Oral contrast was also administered.  CONTRAST:  OMNIPAQUE IOHEXOL 300 MG/ML  SOLN  COMPARISON:  None.  FINDINGS: Lung bases are clear.  Liver is prominent, measuring 17.1 cm in length. No focal liver lesions are identified. The gallbladder wall is not appreciably thickened. There is no biliary duct dilatation.  Spleen, pancreas, and adrenals appear normal. There is a 7 mm cyst in the posterior mid right kidney. There is no hydronephrosis on either side. There is no renal or ureteral calculus on either side.  In the pelvis, the urinary bladder is midline with normal wall thickness. There is inflammatory change surrounding the distal sigmoid colon extending to the upper rectum. There is ill-defined fluid and mesenteric thickening in this area. There is no well-defined abscess in this area. No appreciable perforation is identified in this area. There are multiple sigmoid diverticula in this area. Uterus is absent. There is no periappendiceal region inflammation. Appendix not seen.  There is no bowel obstruction. No free air or portal venous air. There is lipomatous infiltration of the ileocecal valve.  There is no ascites, adenopathy, or abscess in the abdomen or pelvis. There is no abdominal aortic aneurysm. There is degenerative change in the lumbar spine. There are no blastic or lytic bone lesions. There is degenerative change at L5-S1.  IMPRESSION: Evidence of distal sigmoid diverticulitis and upper rectal proctitis. There is ill-defined fluid  surrounding these structures without well-defined abscess currently. No perforations seen. There is surrounding mesenteric thickening.  Elsewhere, there is no bowel obstruction or abscess. No periappendiceal region inflammation.  Liver prominent without focal lesion. No renal or ureteral calculus. No hydronephrosis.   Electronically Signed   By: Bretta Bang III M.D.   On: 11/17/2014 11:17    EKG:  pending  Assessment/Plan Active Problems:   Diverticulitis  ---  Diverticulitis with some pericolonic fluid but no current abscess -  CLD -  IVF and zofran prn -  Aztreonam and flagyl -  Consider repeat imaging in a few days to a week, particularly if abdominal pain is persistent, to determine if abscess is forming -  Stool culture -  Occult stool:  Negative -  Will need GI follow up as outpatient for repeat colonoscopy or inpatient consult if not improving  HTN/HLD, stable -  Continue norvasc and atenolol  Hypothyroidism, stable, continue synthroid  Chronic bronchitis, possible COPD, stable, continue albuterol prn  Bipolar disorder/depression/anxiety, stable -  Continue seroquel and clorazepate  Allergic conjunctivitis -  Visine eye gtt  Diet:  CLD Access:  PIV IVF:  off Proph:  lovenox  Code Status: full Family Communication: patient and husband Disposition Plan: Admit to med-surg  Time spent: 60 min Renae Fickle [facility] Pager [phone]  If 7PM-7AM, please contact night-coverage [email or web address] Password [facility] 11/17/2014, 3:23 PM

## 2014-11-17 NOTE — ED Provider Notes (Addendum)
CSN: [id number]     Arrival date & time 11/17/14  2130 History   First MD Initiated Contact with Patient 11/17/14 [phone]     Chief Complaint  Patient presents with  . Abdominal Pain  . Rectal Pain     (Consider location/radiation/quality/duration/timing/severity/associated sxs/prior Treatment) HPI Comments: Patient presents to the ER for evaluation of abdominal pain. Symptoms began yesterday and have progressively worsened. Pain is in the lower abdomen and into her rectum. She has been experiencing loose stools, has not noticed bleeding. She reports a history of urinary tract infection with similar symptoms, but has not had any frequency or dysuria. She does report that she was told she had diverticulosis when she had colonoscopy 5 years ago. No history of diverticulitis. Patient has not had fever. Patient reports that the pain is severe, it worsens when she gets up and tries to walk.  Patient is a 54 y.o. female presenting with abdominal pain.  Abdominal Pain Associated symptoms: no fever     Past Medical History  Diagnosis Date  . Complication of anesthesia     difficult to wake up and blood pressure drops  . Heart murmur   . Hypertension     sees Dr. Shana Chute, is patients primary  . Dysrhythmia     no medication treatment  . Bronchitis     hx  . Pneumonia     hx  . Hypothyroidism     takes synthroid  . Anemia   . Headache(784.0)   . Arthritis   . Bursitis     hx of  . Mental disorder     bipolar  . Anxiety   . Depression    Past Surgical History  Procedure Laterality Date  . Cesarean section    . Tubal ligation    . Abdominal hysterectomy      has both of ovaries  . Colonoscopy    . Lumbar laminectomy  08/22/2011    Procedure: MICRODISCECTOMY LUMBAR LAMINECTOMY;  Surgeon: Dorian Heckle, MD;  Location: [facility];  Service: Neurosurgery;  Laterality: Right;  RIGHT Lumbar Five-Sacral One microdiskectomy   Family History  Problem Relation Age of Onset  .  Anesthesia problems Mother   . Anesthesia problems Sister    History  Substance Use Topics  . Smoking status: Current Every Day Smoker -- 0.25 packs/day for 1 years    Types: Cigarettes  . Smokeless tobacco: Not on file  . Alcohol Use: Yes     Comment: occ   OB History    No data available     Review of Systems  Constitutional: Negative for fever.  Gastrointestinal: Positive for abdominal pain. Negative for blood in stool and anal bleeding.  All other systems reviewed and are negative.     Allergies  Benazepril; Penicillins; Sulfa antibiotics; Ciprofloxacin hcl; Clindamycin/lincomycin; Erythromycin; Other; and Zithromax  Home Medications   Prior to Admission medications   Medication Sig Start Date End Date Taking? Authorizing Provider  albuterol (PROVENTIL HFA;VENTOLIN HFA) 108 (90 BASE) MCG/ACT inhaler Inhale 2 puffs into the lungs every 4 (four) hours as needed for wheezing or shortness of breath. 08/02/13  Yes Abigail Harris, PA-C  amLODipine (NORVASC) 10 MG tablet Take 1 tablet (10 mg total) by mouth daily. Take this instead of your LOTREL until you see your regular doctor. 04/03/14  Yes Mercedes Camprubi-Soms, PA-C  atenolol (TENORMIN) 50 MG tablet Take 50 mg by mouth daily.   Yes Historical Provider, MD  clorazepate (TRANXENE) 7.5 MG  tablet Take 7.5 mg by mouth 2 (two) times daily.     Yes Historical Provider, MD  docusate sodium (COLACE) 100 MG capsule Take 100 mg by mouth daily.    Yes Historical Provider, MD  levothyroxine (SYNTHROID, LEVOTHROID) 125 MCG tablet Take 125 mcg by mouth daily.     Yes Historical Provider, MD  polycarbophil (FIBERCON) 625 MG tablet Take 625 mg by mouth daily.     Yes Historical Provider, MD  QUEtiapine (SEROQUEL XR) 50 MG TB24 Take 50 mg by mouth at bedtime.    Yes Historical Provider, MD  triamcinolone ointment (KENALOG) 0.1 % Apply 1 application topically daily. All over.   Yes Historical Provider, MD  VITAMIN D, CHOLECALCIFEROL, PO Take 1  tablet by mouth daily.   Yes Historical Provider, MD  diphenhydrAMINE (BENADRYL) 25 MG tablet Take 1 tablet (25 mg total) by mouth every 6 (six) hours. Patient not taking: Reported on 08/11/2014 04/03/14   Mercedes Camprubi-Soms, PA-C  predniSONE (DELTASONE) 20 MG tablet 3 tabs po day one, then 2 tabs daily x 4 days Patient not taking: Reported on 08/11/2014 04/03/14   Mercedes Camprubi-Soms, PA-C  ranitidine (ZANTAC) 150 MG tablet Take 2 tablets (300 mg total) by mouth at bedtime. x5 days Patient not taking: Reported on 08/11/2014 04/03/14   Mercedes Camprubi-Soms, PA-C   BP 155/75 mmHg  Pulse 83  Temp(Src) 99.8 F (37.7 C) (Oral)  Resp 18  Wt 205 lb (92.987 kg)  SpO2 96% Physical Exam  Constitutional: She is oriented to person, place, and time. She appears well-developed and well-nourished. No distress.  HENT:  Head: Normocephalic and atraumatic.  Right Ear: Hearing normal.  Left Ear: Hearing normal.  Nose: Nose normal.  Mouth/Throat: Oropharynx is clear and moist and mucous membranes are normal.  Eyes: Conjunctivae and EOM are normal. Pupils are equal, round, and reactive to light.  Neck: Normal range of motion. Neck supple.  Cardiovascular: Regular rhythm, S1 normal and S2 normal.  Exam reveals no gallop and no friction rub.   No murmur heard. Pulmonary/Chest: Effort normal and breath sounds normal. No respiratory distress. She exhibits no tenderness.  Abdominal: Soft. Normal appearance and bowel sounds are normal. There is no hepatosplenomegaly. There is tenderness in the periumbilical area and suprapubic area. There is no rebound, no guarding, no tenderness at McBurney's point and negative Murphy's sign. No hernia.  Musculoskeletal: Normal range of motion.  Neurological: She is alert and oriented to person, place, and time. She has normal strength. No cranial nerve deficit or sensory deficit. Coordination normal. GCS eye subscore is 4. GCS verbal subscore is 5. GCS motor subscore is 6.   Skin: Skin is warm, dry and intact. No rash noted. No cyanosis.  Psychiatric: She has a normal mood and affect. Her speech is normal and behavior is normal. Thought content normal.  Nursing note and vitals reviewed.   ED Course  Procedures (including critical care time) Labs Review Labs Reviewed  CBC WITH DIFFERENTIAL/PLATELET - Abnormal; Notable for the following:    WBC 12.9 (*)    Neutrophils Relative % 84 (*)    Neutro Abs 10.8 (*)    Lymphocytes Relative 9 (*)    All other components within normal limits  COMPREHENSIVE METABOLIC PANEL - Abnormal; Notable for the following:    Glucose, Bld 127 (*)    All other components within normal limits  URINALYSIS, ROUTINE W REFLEX MICROSCOPIC - Abnormal; Notable for the following:    Specific Gravity, Urine 1.004 (*)  All other components within normal limits  LIPASE, BLOOD  I-STAT CG4 LACTIC ACID, ED  POC OCCULT BLOOD, ED    Imaging Review Ct Abdomen Pelvis W Contrast  11/17/2014   CLINICAL DATA:  Abdominal and rectal pain for 1 day.  EXAM: CT ABDOMEN AND PELVIS WITH CONTRAST  TECHNIQUE: Multidetector CT imaging of the abdomen and pelvis was performed using the standard protocol following bolus administration of intravenous contrast. Oral contrast was also administered.  CONTRAST:  OMNIPAQUE IOHEXOL 300 MG/ML  SOLN  COMPARISON:  None.  FINDINGS: Lung bases are clear.  Liver is prominent, measuring 17.1 cm in length. No focal liver lesions are identified. The gallbladder wall is not appreciably thickened. There is no biliary duct dilatation.  Spleen, pancreas, and adrenals appear normal. There is a 7 mm cyst in the posterior mid right kidney. There is no hydronephrosis on either side. There is no renal or ureteral calculus on either side.  In the pelvis, the urinary bladder is midline with normal wall thickness. There is inflammatory change surrounding the distal sigmoid colon extending to the upper rectum. There is ill-defined fluid  and mesenteric thickening in this area. There is no well-defined abscess in this area. No appreciable perforation is identified in this area. There are multiple sigmoid diverticula in this area. Uterus is absent. There is no periappendiceal region inflammation. Appendix not seen.  There is no bowel obstruction. No free air or portal venous air. There is lipomatous infiltration of the ileocecal valve.  There is no ascites, adenopathy, or abscess in the abdomen or pelvis. There is no abdominal aortic aneurysm. There is degenerative change in the lumbar spine. There are no blastic or lytic bone lesions. There is degenerative change at L5-S1.  IMPRESSION: Evidence of distal sigmoid diverticulitis and upper rectal proctitis. There is ill-defined fluid surrounding these structures without well-defined abscess currently. No perforations seen. There is surrounding mesenteric thickening.  Elsewhere, there is no bowel obstruction or abscess. No periappendiceal region inflammation.  Liver prominent without focal lesion. No renal or ureteral calculus. No hydronephrosis.   Electronically Signed   By: Bretta Bang III M.D.   On: 11/17/2014 11:17     EKG Interpretation None      MDM   Final diagnoses:  Abdominal pain, lower  Diverticulitis of large intestine without perforation or abscess without bleeding    Patient presents to the ER for evaluation of abdominal pain. Pain is in the lower abdominal area diffusely into her rectum. Symptoms have been present for 1 day. She does report a history of diverticulosis. Examination revealed diffuse lower abdominal tenderness, mostly midline. No obvious peritonitis to exam, the patient having difficulty with ambulation because she cannot stand up straight. Pain significantly worsens with movement.  Workup today reveals mild leukocytosis. CT scan shows diverticulitis of the sigmoid colon region and rectum. There is ill-defined fluid surrounding the structures that could  be early abscess. Based on this, will require hospitalization for IV antibiotics and analgesia.  Addendum: Discussed allergies with patient and she has confirmed rash and hives with each of the listed medications. Pharmacy consultation obtained. Aztreonam and Flagyl was recommended for treatment.  Gilda Crease, MD 11/17/14 1248  Gilda Crease, MD 11/17/14 [phone]

## 2014-11-17 NOTE — ED Notes (Signed)
Attempted to call report to 5E. Per unit secretary, pt [facility] and will not be coming to 5E.

## 2014-11-17 NOTE — ED Notes (Signed)
Bed: [id number] Expected date:  Expected time:  Means of arrival:  Comments: [place]

## 2014-11-17 NOTE — Progress Notes (Signed)
On admission, patient has reported that she has had at least 3 loose bowel movements.  Per policy patient will be placed on contact precautions, and stool will be sent for c diff.

## 2014-11-17 NOTE — ED Notes (Signed)
Pt c/o generalized abdominal pain and rectal pain x 1 day.  Pain score 9/10.  Sts stool is "like jelly."  Pt has not taken any medications.  Reports feeling same previously w/ an UTI.  Hx of diverticulitis.

## 2014-11-18 DIAGNOSIS — K5713 Diverticulitis of small intestine without perforation or abscess with bleeding: Secondary | ICD-10-CM

## 2014-11-18 LAB — BASIC METABOLIC PANEL
Anion gap: 11 (ref 5–15)
BUN: 10 mg/dL (ref 6–23)
CO2: 29 mmol/L (ref 19–32)
Calcium: 8.9 mg/dL (ref 8.4–10.5)
Chloride: 101 mmol/L (ref 96–112)
Creatinine, Ser: 0.91 mg/dL (ref 0.50–1.10)
GFR calc Af Amer: 81 mL/min — ABNORMAL LOW (ref >90)
GFR calc non Af Amer: 70 mL/min — ABNORMAL LOW (ref >90)
Glucose, Bld: 111 mg/dL — ABNORMAL HIGH (ref 70–99)
Potassium: 4 mmol/L (ref 3.5–5.1)
Sodium: 141 mmol/L (ref 135–145)

## 2014-11-18 LAB — CBC
HCT: 37.5 % (ref 36.0–46.0)
Hemoglobin: 11.9 g/dL — ABNORMAL LOW (ref 12.0–15.0)
MCH: 28.5 pg (ref 26.0–34.0)
MCHC: 31.7 g/dL (ref 30.0–36.0)
MCV: 89.7 fL (ref 78.0–100.0)
Platelets: 237 10*3/uL (ref 150–400)
RBC: 4.18 MIL/uL (ref 3.87–5.11)
RDW: 15.5 % (ref 11.5–15.5)
WBC: 9.1 10*3/uL (ref 4.0–10.5)

## 2014-11-18 LAB — CLOSTRIDIUM DIFFICILE BY PCR: Toxigenic C. Difficile by PCR: NEGATIVE

## 2014-11-18 NOTE — Progress Notes (Signed)
[facility] PROGRESS NOTE  Assessment/Plan: Acute Diverticulitis - Started empirically in the sternum and Flagyl, IV Zofran IV fluids. - She relates her abdominal pain is better she is tolerating her diet. - CT scan of the abdomen and pelvis on 11/20/2014.  Essential hypertension: - Stable, continue current home regimen.   Bipolar disorder/depression/anxiety: - Stable continue Seroquel and clorazepate  Code Status: full Family Communication: husband  Disposition Plan: inpatient   Consultants:  noen  Procedures:  CT abd and pelvis  Antibiotics:  Aztreonam and flagyl 3.15.2016  HPI/Subjective: Relates she is not nauseated, abd pain is improved.  Objective: Filed Vitals:   11/17/14 1632 11/17/14 1824 11/17/14 2300 11/18/14 0626  BP: 112/60 127/66 115/54 104/57  Pulse: 80 79 69 73  Temp:  99.8 F (37.7 C) 98.2 F (36.8 C) 98.1 F (36.7 C)  TempSrc:  Oral Oral Oral  Resp: 14 18 18 18   Weight:      SpO2: 95% 99% 96% 95%   No intake or output data in the 24 hours ending 11/18/14 1154 Filed Weights   11/17/14 0839  Weight: 92.987 kg (205 lb)    Exam:  General: Alert, awake, oriented x3, in no acute distress.  HEENT: No bruits, no goiter.  Heart: Regular rate and rhythm. Lungs: Good air movement, clear Abdomen: Soft, nontender, nondistended, positive bowel sounds.  Neuro: Grossly intact, nonfocal.   Data Reviewed: Basic Metabolic Panel:  Recent Labs Lab 11/17/14 0920 11/18/14 0615  NA 137 141  K 4.0 4.0  CL 100 101  CO2 29 29  GLUCOSE 127* 111*  BUN 6 10  CREATININE 0.79 0.91  CALCIUM 9.2 8.9   Liver Function Tests:  Recent Labs Lab 11/17/14 0920  AST 16  ALT 14  ALKPHOS 108  BILITOT 1.1  PROT 7.1  ALBUMIN 4.2    Recent Labs Lab 11/17/14 0920  LIPASE 13   No results for input(s): AMMONIA in the last 168 hours. CBC:  Recent Labs Lab 11/17/14 0920 11/18/14 0615  WBC 12.9* 9.1  NEUTROABS 10.8*  --   HGB 14.5  11.9*  HCT 44.7 37.5  MCV 89.0 89.7  PLT 266 237   Cardiac Enzymes: No results for input(s): CKTOTAL, CKMB, CKMBINDEX, TROPONINI in the last 168 hours. BNP (last 3 results) No results for input(s): BNP in the last 8760 hours.  ProBNP (last 3 results) No results for input(s): PROBNP in the last 8760 hours.  CBG: No results for input(s): GLUCAP in the last 168 hours.  Recent Results (from the past 240 hour(s))  Clostridium Difficile by PCR     Status: None   Collection Time: 11/18/14  2:37 AM  Result Value Ref Range Status   C difficile by pcr NEGATIVE NEGATIVE Final     Studies: Ct Abdomen Pelvis W Contrast  11/17/2014   CLINICAL DATA:  Abdominal and rectal pain for 1 day.  EXAM: CT ABDOMEN AND PELVIS WITH CONTRAST  TECHNIQUE: Multidetector CT imaging of the abdomen and pelvis was performed using the standard protocol following bolus administration of intravenous contrast. Oral contrast was also administered.  CONTRAST:  100mL OMNIPAQUE IOHEXOL 300 MG/ML  SOLN  COMPARISON:  None.  FINDINGS: Lung bases are clear.  Liver is prominent, measuring 17.1 cm in length. No focal liver lesions are identified. The gallbladder wall is not appreciably thickened. There is no biliary duct dilatation.  Spleen, pancreas, and adrenals appear normal. There is a 7 mm cyst in the posterior mid right kidney. There  is no hydronephrosis on either side. There is no renal or ureteral calculus on either side.  In the pelvis, the urinary bladder is midline with normal wall thickness. There is inflammatory change surrounding the distal sigmoid colon extending to the upper rectum. There is ill-defined fluid and mesenteric thickening in this area. There is no well-defined abscess in this area. No appreciable perforation is identified in this area. There are multiple sigmoid diverticula in this area. Uterus is absent. There is no periappendiceal region inflammation. Appendix not seen.  There is no bowel obstruction. No free  air or portal venous air. There is lipomatous infiltration of the ileocecal valve.  There is no ascites, adenopathy, or abscess in the abdomen or pelvis. There is no abdominal aortic aneurysm. There is degenerative change in the lumbar spine. There are no blastic or lytic bone lesions. There is degenerative change at L5-S1.  IMPRESSION: Evidence of distal sigmoid diverticulitis and upper rectal proctitis. There is ill-defined fluid surrounding these structures without well-defined abscess currently. No perforations seen. There is surrounding mesenteric thickening.  Elsewhere, there is no bowel obstruction or abscess. No periappendiceal region inflammation.  Liver prominent without focal lesion. No renal or ureteral calculus. No hydronephrosis.   Electronically Signed   By: Bretta Bang III M.D.   On: 11/17/2014 11:17    Scheduled Meds: . amLODipine  10 mg Oral Daily  . atenolol  50 mg Oral Daily  . aztreonam  2 g Intravenous 3 times per day  . clorazepate  7.5 mg Oral BID  . enoxaparin (LOVENOX) injection  50 mg Subcutaneous Q24H  . levothyroxine  125 mcg Oral QAC breakfast  . metronidazole  500 mg Intravenous Q6H  . QUEtiapine  50 mg Oral QHS  . triamcinolone ointment  1 application Topical Daily   Continuous Infusions: . sodium chloride 75 mL/hr at 11/18/14 0229     Marinda Elk  [facility] Pager [phone]. If 7PM-7AM, please contact night-coverage at [email or web address], password [facility] 11/18/2014, 11:54 AM  LOS: 1 day

## 2014-11-19 ENCOUNTER — Inpatient Hospital Stay (HOSPITAL_COMMUNITY): Payer: Medicaid Other

## 2014-11-19 DIAGNOSIS — K5712 Diverticulitis of small intestine without perforation or abscess without bleeding: Secondary | ICD-10-CM

## 2014-11-19 NOTE — Progress Notes (Signed)
[facility] PROGRESS NOTE  Assessment/Plan: Acute Diverticulitis - Started empirically on cipro and Flagyl, IV Zofran, tolerating diet KVO IV fluids. - Abd pain resolved, advance diet. - CT scan of the abdomen and pelvis on 11/20/2014.  Essential hypertension: - Stable, continue current home regimen.   Bipolar disorder/depression/anxiety: - Stable continue Seroquel and clorazepate  Code Status: full Family Communication: husband  Disposition Plan: inpatient   Consultants:  noen  Procedures:  CT abd and pelvis  Antibiotics:  Aztreonam and flagyl 3.15.2016  HPI/Subjective: NO complains is hungry  Objective: Filed Vitals:   11/19/14 0601 11/19/14 0750 11/19/14 0915 11/19/14 0948  BP: 109/57 125/63 110/62 111/52  Pulse: 67 65  78  Temp: 98.5 F (36.9 C) 98.5 F (36.9 C)  98.7 F (37.1 C)  TempSrc: Oral Oral  Oral  Resp: 18 16  16   Weight:      SpO2: 93% 99%  96%   No intake or output data in the 24 hours ending 11/19/14 0955 Filed Weights   11/17/14 0839  Weight: 92.987 kg (205 lb)    Exam:  General: Alert, awake, oriented x3, in no acute distress.  HEENT: No bruits, no goiter.  Heart: Regular rate and rhythm. Lungs: Good air movement, clear Abdomen: Soft, nontender, nondistended, positive bowel sounds.  Neuro: Grossly intact, nonfocal.   Data Reviewed: Basic Metabolic Panel:  Recent Labs Lab 11/17/14 0920 11/18/14 0615  NA 137 141  K 4.0 4.0  CL 100 101  CO2 29 29  GLUCOSE 127* 111*  BUN 6 10  CREATININE 0.79 0.91  CALCIUM 9.2 8.9   Liver Function Tests:  Recent Labs Lab 11/17/14 0920  AST 16  ALT 14  ALKPHOS 108  BILITOT 1.1  PROT 7.1  ALBUMIN 4.2    Recent Labs Lab 11/17/14 0920  LIPASE 13   No results for input(s): AMMONIA in the last 168 hours. CBC:  Recent Labs Lab 11/17/14 0920 11/18/14 0615  WBC 12.9* 9.1  NEUTROABS 10.8*  --   HGB 14.5 11.9*  HCT 44.7 37.5  MCV 89.0 89.7  PLT 266 237    Cardiac Enzymes: No results for input(s): CKTOTAL, CKMB, CKMBINDEX, TROPONINI in the last 168 hours. BNP (last 3 results) No results for input(s): BNP in the last 8760 hours.  ProBNP (last 3 results) No results for input(s): PROBNP in the last 8760 hours.  CBG: No results for input(s): GLUCAP in the last 168 hours.  Recent Results (from the past 240 hour(s))  Stool culture     Status: None (Preliminary result)   Collection Time: 11/18/14  2:37 AM  Result Value Ref Range Status   Specimen Description STOOL  Final   Special Requests Normal  Final   Culture   Final    Culture reincubated for better growth Performed at [facility]    Report Status PENDING  Incomplete  Clostridium Difficile by PCR     Status: None   Collection Time: 11/18/14  2:37 AM  Result Value Ref Range Status   C difficile by pcr NEGATIVE NEGATIVE Final     Studies: Ct Abdomen Pelvis W Contrast  11/17/2014   CLINICAL DATA:  Abdominal and rectal pain for 1 day.  EXAM: CT ABDOMEN AND PELVIS WITH CONTRAST  TECHNIQUE: Multidetector CT imaging of the abdomen and pelvis was performed using the standard protocol following bolus administration of intravenous contrast. Oral contrast was also administered.  CONTRAST:  100mL OMNIPAQUE IOHEXOL 300 MG/ML  SOLN  COMPARISON:  None.  FINDINGS: Lung bases are clear.  Liver is prominent, measuring 17.1 cm in length. No focal liver lesions are identified. The gallbladder wall is not appreciably thickened. There is no biliary duct dilatation.  Spleen, pancreas, and adrenals appear normal. There is a 7 mm cyst in the posterior mid right kidney. There is no hydronephrosis on either side. There is no renal or ureteral calculus on either side.  In the pelvis, the urinary bladder is midline with normal wall thickness. There is inflammatory change surrounding the distal sigmoid colon extending to the upper rectum. There is ill-defined fluid and mesenteric thickening in this area.  There is no well-defined abscess in this area. No appreciable perforation is identified in this area. There are multiple sigmoid diverticula in this area. Uterus is absent. There is no periappendiceal region inflammation. Appendix not seen.  There is no bowel obstruction. No free air or portal venous air. There is lipomatous infiltration of the ileocecal valve.  There is no ascites, adenopathy, or abscess in the abdomen or pelvis. There is no abdominal aortic aneurysm. There is degenerative change in the lumbar spine. There are no blastic or lytic bone lesions. There is degenerative change at L5-S1.  IMPRESSION: Evidence of distal sigmoid diverticulitis and upper rectal proctitis. There is ill-defined fluid surrounding these structures without well-defined abscess currently. No perforations seen. There is surrounding mesenteric thickening.  Elsewhere, there is no bowel obstruction or abscess. No periappendiceal region inflammation.  Liver prominent without focal lesion. No renal or ureteral calculus. No hydronephrosis.   Electronically Signed   By: Bretta Bang III M.D.   On: 11/17/2014 11:17    Scheduled Meds: . amLODipine  10 mg Oral Daily  . atenolol  50 mg Oral Daily  . aztreonam  2 g Intravenous 3 times per day  . clorazepate  7.5 mg Oral BID  . enoxaparin (LOVENOX) injection  50 mg Subcutaneous Q24H  . levothyroxine  125 mcg Oral QAC breakfast  . metronidazole  500 mg Intravenous Q6H  . QUEtiapine  50 mg Oral QHS  . triamcinolone ointment  1 application Topical Daily   Continuous Infusions: . sodium chloride 75 mL/hr at 11/18/14 2157     Marinda Elk  [facility] Pager [phone]. If 7PM-7AM, please contact night-coverage at [email or web address], password [facility] 11/19/2014, 9:55 AM  LOS: 2 days

## 2014-11-20 ENCOUNTER — Inpatient Hospital Stay (HOSPITAL_COMMUNITY): Payer: Medicaid Other

## 2014-11-20 ENCOUNTER — Encounter (HOSPITAL_COMMUNITY): Payer: Self-pay | Admitting: Radiology

## 2014-11-20 MED ORDER — IOHEXOL 300 MG/ML  SOLN
100.0000 mL | Freq: Once | INTRAMUSCULAR | Status: AC | PRN
  Administered 2014-11-20: 100 mL via INTRAVENOUS

## 2014-11-20 MED ORDER — IOHEXOL 300 MG/ML  SOLN: 25.0000 mL | Freq: Once | INTRAMUSCULAR | Status: DC | PRN

## 2014-11-20 MED ORDER — IOHEXOL 300 MG/ML  SOLN
25.0000 mL | INTRAMUSCULAR | Status: AC
  Administered 2014-11-20 (×2): 25 mL via ORAL

## 2014-11-20 NOTE — Consult Note (Signed)
Reason for Consult:  Diverticulitis with interval development of intramural abscess right lateral wall of the distal sigmoid colon. Referring Physician:  Marinda Elk, MD  [name] is an 54 y.o. female.  HPI: 55 y/o female admitted on 11/17/14 with cramping and gas pains. Pain started on 11/14/13 and [name] went to the ED on 11/16/14.   Pain in the mid lower abdomen became progressively worse and rated 10/10, [name] also had nausea and vomiting with this.  [name] reported  a yellow mucus like rectal discharge/stool before [name] came to the ED.  Low grade fever in ED. CT demonstrated distal sigmoid diverticulitis and an upper rectal proctitis, with fluid surrounding it without a well defined abscess.  [name] was admitted and started on IV antibiotics; aztreonam and metronidazole.  Repeat CT scan today shows:  interval development of an approximate 2.8 x 2.8 x 2.8 cm multiloculated fluid collection which likely involves the wall of the distal sigmoid colon in the area of diverticulitis. Marked thickening of the wall of the distal sigmoid colon persists, and there is extensive edema/inflammation in the surrounding fat. No evidence of abscess elsewhere.  Diverticulosis involving the descending and sigmoid colon. Remainder of the colon unremarkable. No evidence of colonic obstruction despite the luminal narrowing in the distal sigmoid colon.the stomach was decompressed and stable.  [name] remains afebrile, VSS, WBC is normal H/H down down some since admit.  BMP OK.  [name] is on a regular diet.  [name] was having some pain in her mid lower abdomen with the regular diet, but overall [name] seem better than [name] did on admit. We are ask to see.  Past Medical History  Diagnosis Date  Hypertension    sees Dr. Shana Chute, is patients primary     Dysrhythmia -  No current medical treatment   Bipolar disorder/Anxiety/Depression GERD (gastroesophageal reflux disease)  Hypothyroid       Chronic bronchitis/hx of pneumonia    Anemia  Headache(784.0)  Arthritis  Bursitis     Hx of heart mumur, I do not see an echo   Complication of anesthesia    difficult to wake up and blood pressure drops  Eczema   Multiple medicine allergies 8 are antibiotics     Past Surgical History  Procedure Laterality Date  . Cesarean section    . Tubal ligation    . Abdominal hysterectomy      has both of ovaries  . Colonoscopy    . Lumbar laminectomy  08/22/2011    Procedure: MICRODISCECTOMY LUMBAR LAMINECTOMY;  Surgeon: Dorian Heckle, MD;  Location: [facility];  Service: Neurosurgery;  Laterality: Right;  RIGHT Lumbar Five-Sacral One microdiskectomy    Family History  Problem Relation Age of Onset  . Anesthesia problems Mother   . Anesthesia problems Sister   . Cancer Mother     thyroid  . Cancer Maternal Aunt   . Cancer Maternal Aunt   . Colon cancer Sister     Social History:  reports that [name] has been smoking Cigarettes.  [name] has a .25 pack-year smoking history. [name] has never used smokeless tobacco. [name] reports that [name] drinks alcohol. [name] reports that [name] does not use illicit drugs. Tobacco:  <1PPD on and off since age 84 ETOH:  Occasional beer Drugs:  None   Allergies:  Allergies  Allergen Reactions  . Benazepril Anaphylaxis, Hives and Itching  . Penicillins Hives    Has itching and some swelling in face  . Sulfa Antibiotics Hives  Itching and facial swelling  . Ciprofloxacin Hcl Hives    "funny sensation in gums"  . Clindamycin/Lincomycin Hives    Itching, welps  . Erythromycin Hives    itching  . Other     Pt has problems with being awaken from anesthesia, Her blood pressure drops very low  . Zithromax [Azithromycin] Hives    itching   Prior to Admission medications   Medication Sig Start Date End Date Taking? Authorizing Provider  albuterol (PROVENTIL HFA;VENTOLIN HFA) 108 (90 BASE) MCG/ACT inhaler Inhale 2 puffs into the lungs every 4 (four) hours as needed for wheezing or shortness of  breath. 08/02/13  Yes Abigail Harris, PA-C  amLODipine (NORVASC) 10 MG tablet Take 1 tablet (10 mg total) by mouth daily. Take this instead of your LOTREL until you see your regular doctor. 04/03/14  Yes Mercedes Camprubi-Soms, PA-C  atenolol (TENORMIN) 50 MG tablet Take 50 mg by mouth daily.   Yes Historical Provider, MD  clorazepate (TRANXENE) 7.5 MG tablet Take 7.5 mg by mouth 2 (two) times daily.     Yes Historical Provider, MD  docusate sodium (COLACE) 100 MG capsule Take 100 mg by mouth daily.    Yes Historical Provider, MD  levothyroxine (SYNTHROID, LEVOTHROID) 125 MCG tablet Take 125 mcg by mouth daily.     Yes Historical Provider, MD  polycarbophil (FIBERCON) 625 MG tablet Take 625 mg by mouth daily.     Yes Historical Provider, MD  QUEtiapine (SEROQUEL XR) 50 MG TB24 Take 50 mg by mouth at bedtime.    Yes Historical Provider, MD  triamcinolone ointment (KENALOG) 0.1 % Apply 1 application topically daily. All over.   Yes Historical Provider, MD  VITAMIN D, CHOLECALCIFEROL, PO Take 1 tablet by mouth daily.   Yes Historical Provider, MD  diphenhydrAMINE (BENADRYL) 25 MG tablet Take 1 tablet (25 mg total) by mouth every 6 (six) hours. Patient not taking: Reported on 08/11/2014 04/03/14   Mercedes Camprubi-Soms, PA-C    Medications:  Prior to Admission:  Prescriptions prior to admission  Medication Sig Dispense Refill Last Dose  . albuterol (PROVENTIL HFA;VENTOLIN HFA) 108 (90 BASE) MCG/ACT inhaler Inhale 2 puffs into the lungs every 4 (four) hours as needed for wheezing or shortness of breath. 1 Inhaler 0 11/16/2014 at Unknown time  . amLODipine (NORVASC) 10 MG tablet Take 1 tablet (10 mg total) by mouth daily. Take this instead of your LOTREL until you see your regular doctor. 15 tablet 0 11/17/2014 at Unknown time  . atenolol (TENORMIN) 50 MG tablet Take 50 mg by mouth daily.   11/17/2014 at 0710  . clorazepate (TRANXENE) 7.5 MG tablet Take 7.5 mg by mouth 2 (two) times daily.     11/17/2014 at  Unknown time  . docusate sodium (COLACE) 100 MG capsule Take 100 mg by mouth daily.    11/16/2014 at Unknown time  . levothyroxine (SYNTHROID, LEVOTHROID) 125 MCG tablet Take 125 mcg by mouth daily.     11/17/2014 at Unknown time  . polycarbophil (FIBERCON) 625 MG tablet Take 625 mg by mouth daily.     11/16/2014 at Unknown time  . QUEtiapine (SEROQUEL XR) 50 MG TB24 Take 50 mg by mouth at bedtime.    11/16/2014 at Unknown time  . triamcinolone ointment (KENALOG) 0.1 % Apply 1 application topically daily. All over.   11/16/2014 at Unknown time  . VITAMIN D, CHOLECALCIFEROL, PO Take 1 tablet by mouth daily.   11/16/2014 at Unknown time  . diphenhydrAMINE (BENADRYL) 25  MG tablet Take 1 tablet (25 mg total) by mouth every 6 (six) hours. (Patient not taking: Reported on 08/11/2014) 20 tablet 0 Not Taking at Unknown time   Scheduled: . amLODipine  10 mg Oral Daily  . atenolol  50 mg Oral Daily  . aztreonam  2 g Intravenous 3 times per day  . clorazepate  7.5 mg Oral BID  . enoxaparin (LOVENOX) injection  50 mg Subcutaneous Q24H  . levothyroxine  125 mcg Oral QAC breakfast  . metronidazole  500 mg Intravenous Q6H  . QUEtiapine  50 mg Oral QHS  . triamcinolone ointment  1 application Topical Daily   Continuous:  [id number], HYDROmorphone (DILAUDID) injection, naphazoline, ondansetron **OR** ondansetron (ZOFRAN) IV Anti-infectives    Start     Dose/Rate Route Frequency Ordered Stop   11/17/14 1400  aztreonam (AZACTAM) 2 g in dextrose 5 % 50 mL IVPB     2 g 100 mL/hr over 30 Minutes Intravenous 3 times per day 11/17/14 1320     11/17/14 1315  metroNIDAZOLE (FLAGYL) IVPB 500 mg     500 mg 100 mL/hr over 60 Minutes Intravenous Every 6 hours 11/17/14 1305     11/17/14 1300  metroNIDAZOLE (FLAGYL) IVPB 500 mg  Status:  Discontinued     500 mg 100 mL/hr over 60 Minutes Intravenous Every 6 hours 11/17/14 1246 11/17/14 1255      No results found for this or any previous visit (from the past 48  hour(s)).  Ct Abdomen Pelvis W Contrast  11/20/2014   CLINICAL DATA:  Followup sigmoid diverticulitis. Acute bilateral lower quadrant abdominal pain radiating to the back. Leukocytosis. Nausea. Surgical history includes hysterectomy.  EXAM: CT ABDOMEN AND PELVIS WITH CONTRAST  TECHNIQUE: Multidetector CT imaging of the abdomen and pelvis was performed using the standard protocol following bolus administration of intravenous contrast.  CONTRAST:  OMNIPAQUE IOHEXOL 300 MG/ML IV. Oral contrast was also administered.  COMPARISON:  11/17/2014.  FINDINGS: Since the examination 3 days ago, interval development of an approximate 2.8 x 2.8 x 2.8 cm multiloculated fluid collection which likely involves the wall of the distal sigmoid colon in the area of diverticulitis. Marked thickening of the wall of the distal sigmoid colon persists, and there is extensive edema/inflammation in the surrounding fat. No evidence of abscess elsewhere.  Diverticulosis involving the descending and sigmoid colon. Remainder of the colon unremarkable. No evidence of colonic obstruction despite the luminal narrowing in the distal sigmoid colon. Small bowel normal in appearance. Stomach decompressed and unremarkable.  Normal early portal venous phase appearance of the liver, spleen, pancreas, adrenal glands and kidneys. Gallbladder unremarkable by CT. No biliary ductal dilation. No visible aortoiliofemoral atherosclerosis. Widely patent visceral arteries. No significant lymphadenopathy.  Uterus surgically absent. No adnexal masses. Urinary bladder unremarkable.  Bone window images demonstrate degenerative disc disease and spondylosis at L5-S 1, facet degenerative changes throughout the lumbar spine, and mild degenerative changes in the sacroiliac joints. Visualized lung bases clear. Heart size upper normal to perhaps slightly enlarged.  IMPRESSION: 1. Interval development of an approximate 3 cm intramural abscess involving the right lateral  wall of the distal sigmoid colon in the area of acute diverticulitis. 2. Persistent marked thickening of the wall of the distal sigmoid colon with associated luminal narrowing, though there is no evidence of colonic obstruction. 3. No new abnormalities otherwise since the CT 3 days ago.   Electronically Signed   By: Hulan Saas M.D.   On: 11/20/2014 11:40  Review of Systems  Constitutional: Positive for fever (low grade in ED) and weight loss (some over the last year, but nothing acute.). Negative for chills, malaise/fatigue and diaphoresis.  HENT: Negative.   Eyes: Negative.   Respiratory: Negative.   Cardiovascular: Negative.   Gastrointestinal: Positive for heartburn, nausea and abdominal pain. Negative for vomiting, diarrhea, constipation, blood in stool and melena.  Genitourinary: Negative.   Musculoskeletal: Positive for back pain (chronic back pain, for years.) and joint pain (knee's mostly). Negative for falls.  Neurological: Positive for dizziness (sounds orthostatic, and has been present sicne 1994.). Negative for weakness.  Psychiatric/Behavioral: Positive for depression. The patient is nervous/anxious.        Bipolar followed by [facility] here in [place].   Blood pressure 110/64, pulse 57, temperature 98.6 F (37 C), temperature source Oral, resp. rate 20, height  (1.727 m), weight 92.987 kg (205 lb), SpO2 99 %. Physical Exam  Constitutional: [name] is oriented to person, place, and time. [name] appears well-developed and well-nourished. No distress.  [name] was having some pain lower abdomen with regular lunch, but [name] is not acutely ill.  HENT:  Head: Normocephalic and atraumatic.  Nose: Nose normal.  Eyes: Conjunctivae are normal. Right eye exhibits no discharge. Left eye exhibits no discharge. No scleral icterus.  Neck: Normal range of motion. Neck supple. No JVD present. No tracheal deviation present. No thyromegaly present.  Cardiovascular:  Normal rate, regular rhythm and intact distal pulses.   Murmur (I did not hear a mumur/TV on) heard. Respiratory: Effort normal and breath sounds normal. No respiratory distress. [name] has no wheezes. [name] has no rales. [name] exhibits no tenderness.  GI: Soft. Bowel sounds are normal. [name] exhibits no distension and no mass. There is tenderness (still some soreness more than anything in the midlinelower abdomen.). There is no rebound and no guarding.  Musculoskeletal: [name] exhibits no edema or tenderness.  Lymphadenopathy:    [name] has no cervical adenopathy.  Neurological: [name] is alert and oriented to person, place, and time. No cranial nerve deficit.  Skin: Skin is warm and dry. No rash noted. [name] is not diaphoretic. No erythema. No pallor.  Psychiatric: [name] has a normal mood and affect. Her behavior is normal. Judgment and thought content normal.    Assessment/Plan: 1.  Sigmoid diverticulitis with new 2.8 x 2.8 x 2.8cm fluid collection/abscess wall of the sigmoid colon. 2.  Hypertension 3.  Bipolar disorder/Anxiety/Depression 4.  GERD 5.  Hypothyroid 6.  Chronic back pain 7. Multiple Medicine allergies (5 are antibiotics) 8.  Chronic bronchitis/ongoing tobacco use/hx of pneumonia  Plan:  [name] has been seen and evluated by DR. Hoxworth.  At this point we would continue antibiotics and see how [name] does.  [name] is not acutely sick currently.  We will follow with you.  Marguerite Barba 11/20/2014, 12:46 PM

## 2014-11-20 NOTE — Progress Notes (Signed)
CARE MANAGEMENT NOTE 11/20/2014  Patient:  [name],[name]   Account Number:  [id number]  Date Initiated:  11/20/2014  Documentation initiated by:  Ferdinand CavaSCHETTINO,Royale Lennartz  Subjective/Objective Assessment:   54 yo female admitted with diverticulitis     Action/Plan:   discharge planning   Anticipated DC Date:  11/23/2014   Anticipated DC Plan:  HOME/SELF CARE      DC Planning Services  CM consult      Choice offered to / List presented to:             Status of service:  Completed, signed off Medicare Important Message given?   (If response is "NO", the following Medicare IM given date fields will be blank) Date Medicare IM given:   Medicare IM given by:   Date Additional Medicare IM given:   Additional Medicare IM given by:    Discharge Disposition:    Per UR Regulation:    If discussed at Long Length of Stay Meetings, dates discussed:    Comments:  11/20/14 Ferdinand CavaAndrea Schettino RN BSN CM [phone] [id number] Patient stated that [facility] [address] She stated that her PCP is with [facility]. Patient stated that she uses bus passes for transportation. provided patient with contact number for Medicaid transportation and explained that she can set it up while in the room and then call schedule the transportation for any MD appointments and is a free services for Medicaid recipients. Patient stated that when she is discharged [facility]

## 2014-11-20 NOTE — Progress Notes (Signed)
ANTIBIOTIC CONSULT NOTE  Pharmacy Consult for Aztreonam Indication: Diverticulitis  Allergies  Allergen Reactions  . Benazepril Anaphylaxis, Hives and Itching  . Penicillins Hives    Has itching and some swelling in face  . Sulfa Antibiotics Hives    Itching and facial swelling  . Ciprofloxacin Hcl Hives    "funny sensation in gums"  . Clindamycin/Lincomycin Hives    Itching, welps  . Erythromycin Hives    itching  . Other     Pt has problems with being awaken from anesthesia, Her blood pressure drops very low  . Zithromax [Azithromycin] Hives    itching    Patient Measurements: Height: 5\' 8"  (172.7 cm) Weight: 205 lb (92.987 kg) IBW/kg (Calculated) : 63.9  Vital Signs: Temp: 98.6 F (37 C) (03/18 0614) Temp Source: Oral (03/18 0614) BP: 109/62 mmHg (03/18 0614) Pulse Rate: 88 (03/18 0614) Intake/Output from previous day:    Labs:  Recent Labs  11/17/14 0920 11/18/14 0615  WBC 12.9* 9.1  HGB 14.5 11.9*  PLT 266 237  CREATININE 0.79 0.91   Estimated Creatinine Clearance: 83.3 mL/min (by C-G formula based on Cr of 0.91).   Assessment: 54yo F with abdominal pain and rectal pain. History of diverticulitis and multiple antibiotic allergies. Flagyl is started and pharmacy is asked to dose aztreonam.  3/15 >> Flagyl >> 3/15 >> Aztreonam >>  Today, 11/20/2014:  Day #4 Aztreonam and Flagyl.  Tmax: afebrile  WBCs: improved to WNL  Renal: SCr stable/WNL, CrCl ~ 83 ml/min  Goal of Therapy:  Appropriate antibiotic dosing for renal function; eradication of infection  Plan:   Aztreonam 2g IV q8h.  Follow up renal fxn, culture results, and clinical course.  Lynann Beaverhristine Tanikka Bresnan PharmD, BCPS Pager [phone] 11/20/2014 8:14 AM

## 2014-11-20 NOTE — Progress Notes (Signed)
[facility] PROGRESS NOTE  Assessment/Plan: Acute Diverticulitis - Change cipro and Flagyl to oral, IV Zofran, tolerating diet KVO IV fluids. - tolerating diet. - CT scan of the abdomen and pelvis on 11/20/2014, pending  Essential hypertension: - Stable, continue current home regimen.   Bipolar disorder/depression/anxiety: - Stable continue Seroquel and clorazepate  Code Status: full Family Communication: husband  Disposition Plan: inpatient   Consultants:  none  Procedures:  CT abd and pelvis  Antibiotics:  Aztreonam and flagyl 3.15.2016  HPI/Subjective: No complains.  Objective: Filed Vitals:   11/19/14 1426 11/19/14 1835 11/19/14 2139 11/20/14 0614  BP: 109/61  112/64 109/62  Pulse: 64  68 88  Temp: 98.6 F (37 C)  98.2 F (36.8 C) 98.6 F (37 C)  TempSrc: Oral  Oral Oral  Resp: 18  16 18   Height:  5\' 8"  (1.727 m)    Weight:      SpO2: 98%  96% 98%   No intake or output data in the 24 hours ending 11/20/14 1001 Filed Weights   11/17/14 0839  Weight: 92.987 kg (205 lb)    Exam:  General: Alert, awake, oriented x3, in no acute distress.  HEENT: No bruits, no goiter.  Heart: Regular rate and rhythm. Lungs: Good air movement, clear Abdomen: Soft, nontender, nondistended, positive bowel sounds.  Neuro: Grossly intact, nonfocal.   Data Reviewed: Basic Metabolic Panel:  Recent Labs Lab 11/17/14 0920 11/18/14 0615  NA 137 141  K 4.0 4.0  CL 100 101  CO2 29 29  GLUCOSE 127* 111*  BUN 6 10  CREATININE 0.79 0.91  CALCIUM 9.2 8.9   Liver Function Tests:  Recent Labs Lab 11/17/14 0920  AST 16  ALT 14  ALKPHOS 108  BILITOT 1.1  PROT 7.1  ALBUMIN 4.2    Recent Labs Lab 11/17/14 0920  LIPASE 13   No results for input(s): AMMONIA in the last 168 hours. CBC:  Recent Labs Lab 11/17/14 0920 11/18/14 0615  WBC 12.9* 9.1  NEUTROABS 10.8*  --   HGB 14.5 11.9*  HCT 44.7 37.5  MCV 89.0 89.7  PLT 266 237   Cardiac  Enzymes: No results for input(s): CKTOTAL, CKMB, CKMBINDEX, TROPONINI in the last 168 hours. BNP (last 3 results) No results for input(s): BNP in the last 8760 hours.  ProBNP (last 3 results) No results for input(s): PROBNP in the last 8760 hours.  CBG: No results for input(s): GLUCAP in the last 168 hours.  Recent Results (from the past 240 hour(s))  Stool culture     Status: None (Preliminary result)   Collection Time: 11/18/14  2:37 AM  Result Value Ref Range Status   Specimen Description STOOL  Final   Special Requests Normal  Final   Culture   Final    Culture reincubated for better growth Performed at [facility]    Report Status PENDING  Incomplete  Clostridium Difficile by PCR     Status: None   Collection Time: 11/18/14  2:37 AM  Result Value Ref Range Status   C difficile by pcr NEGATIVE NEGATIVE Final     Studies: No results found.  Scheduled Meds: . amLODipine  10 mg Oral Daily  . atenolol  50 mg Oral Daily  . aztreonam  2 g Intravenous 3 times per day  . clorazepate  7.5 mg Oral BID  . enoxaparin (LOVENOX) injection  50 mg Subcutaneous Q24H  . levothyroxine  125 mcg Oral QAC breakfast  .  metronidazole  500 mg Intravenous Q6H  . QUEtiapine  50 mg Oral QHS  . triamcinolone ointment  1 application Topical Daily   Continuous Infusions:     Marinda Elk  [facility] Pager [phone]. If 7PM-7AM, please contact night-coverage at [email or web address], password [email or web address] 11/20/2014, 10:01 AM  LOS: 3 days

## 2014-11-21 DIAGNOSIS — K5700 Diverticulitis of small intestine with perforation and abscess without bleeding: Secondary | ICD-10-CM

## 2014-11-21 NOTE — Progress Notes (Signed)
[facility] PROGRESS NOTE  Assessment/Plan: Acute Diverticulitis - Cont IV cipro and Flagyl, IV Zofran, tolerating diet KVO IV fluids. - tolerating diet. - CT scan of the abdomen and pelvis on 11/20/2014, pending  Essential hypertension: - Stable, continue current home regimen.   Bipolar disorder/depression/anxiety: - Stable continue Seroquel and clorazepate  Code Status: full Family Communication: husband  Disposition Plan: inpatient   Consultants:  none  Procedures:  CT abd and pelvis  Antibiotics:  Aztreonam and flagyl 3.15.2016  HPI/Subjective: No complains.  Objective: Filed Vitals:   11/20/14 0614 11/20/14 1233 11/20/14 2207 11/21/14 0535  BP: 109/62 110/64 108/62 150/92  Pulse: 88 57 57 80  Temp: 98.6 F (37 C) 98.6 F (37 C) 98.4 F (36.9 C) 97.9 F (36.6 C)  TempSrc: Oral Oral Oral Oral  Resp: Height:      Weight:      SpO2: 98% 99% 95% 96%    Intake/Output Summary (Last 24 hours) at 11/21/14 0957 Last data filed at 11/21/14 0847  Gross per 24 hour  Intake    240 ml  Output      0 ml  Net    240 ml   Filed Weights   11/17/14 0839  Weight: 92.987 kg (205 lb)    Exam:  General: Alert, awake, oriented x3, in no acute distress.  HEENT: No bruits, no goiter.  Heart: Regular rate and rhythm. Lungs: Good air movement, clear Abdomen: Soft, nontender, nondistended, positive bowel sounds.  Neuro: Grossly intact, nonfocal.   Data Reviewed: Basic Metabolic Panel:  Recent Labs Lab 11/17/14 0920 11/18/14 0615  NA 137 141  K 4.0 4.0  CL 100 101  CO2 29 29  GLUCOSE 127* 111*  BUN 6 10  CREATININE 0.79 0.91  CALCIUM 9.2 8.9   Liver Function Tests:  Recent Labs Lab 11/17/14 0920  AST 16  ALT 14  ALKPHOS 108  BILITOT 1.1  PROT 7.1  ALBUMIN 4.2    Recent Labs Lab 11/17/14 0920  LIPASE 13   No results for input(s): AMMONIA in the last 168 hours. CBC:  Recent Labs Lab 11/17/14 0920  11/18/14 0615  WBC 12.9* 9.1  NEUTROABS 10.8*  --   HGB 14.5 11.9*  HCT 44.7 37.5  MCV 89.0 89.7  PLT 266 237   Cardiac Enzymes: No results for input(s): CKTOTAL, CKMB, CKMBINDEX, TROPONINI in the last 168 hours. BNP (last 3 results) No results for input(s): BNP in the last 8760 hours.  ProBNP (last 3 results) No results for input(s): PROBNP in the last 8760 hours.  CBG: No results for input(s): GLUCAP in the last 168 hours.  Recent Results (from the past 240 hour(s))  Stool culture     Status: None (Preliminary result)   Collection Time: 11/18/14  2:37 AM  Result Value Ref Range Status   Specimen Description STOOL  Final   Special Requests Normal  Final   Culture   Final    Culture reincubated for better growth Performed at [facility]    Report Status PENDING  Incomplete  Clostridium Difficile by PCR     Status: None   Collection Time: 11/18/14  2:37 AM  Result Value Ref Range Status   C difficile by pcr NEGATIVE NEGATIVE Final     Studies: Ct Abdomen Pelvis W Contrast  11/20/2014   CLINICAL DATA:  Followup sigmoid diverticulitis. Acute bilateral lower quadrant abdominal pain radiating to the back. Leukocytosis. Nausea. Surgical history  includes hysterectomy.  EXAM: CT ABDOMEN AND PELVIS WITH CONTRAST  TECHNIQUE: Multidetector CT imaging of the abdomen and pelvis was performed using the standard protocol following bolus administration of intravenous contrast.  CONTRAST:  100mL OMNIPAQUE IOHEXOL 300 MG/ML IV. Oral contrast was also administered.  COMPARISON:  11/17/2014.  FINDINGS: Since the examination 3 days ago, interval development of an approximate 2.8 x 2.8 x 2.8 cm multiloculated fluid collection which likely involves the wall of the distal sigmoid colon in the area of diverticulitis. Marked thickening of the wall of the distal sigmoid colon persists, and there is extensive edema/inflammation in the surrounding fat. No evidence of abscess elsewhere.   Diverticulosis involving the descending and sigmoid colon. Remainder of the colon unremarkable. No evidence of colonic obstruction despite the luminal narrowing in the distal sigmoid colon. Small bowel normal in appearance. Stomach decompressed and unremarkable.  Normal early portal venous phase appearance of the liver, spleen, pancreas, adrenal glands and kidneys. Gallbladder unremarkable by CT. No biliary ductal dilation. No visible aortoiliofemoral atherosclerosis. Widely patent visceral arteries. No significant lymphadenopathy.  Uterus surgically absent. No adnexal masses. Urinary bladder unremarkable.  Bone window images demonstrate degenerative disc disease and spondylosis at L5-S 1, facet degenerative changes throughout the lumbar spine, and mild degenerative changes in the sacroiliac joints. Visualized lung bases clear. Heart size upper normal to perhaps slightly enlarged.  IMPRESSION: 1. Interval development of an approximate 3 cm intramural abscess involving the right lateral wall of the distal sigmoid colon in the area of acute diverticulitis. 2. Persistent marked thickening of the wall of the distal sigmoid colon with associated luminal narrowing, though there is no evidence of colonic obstruction. 3. No new abnormalities otherwise since the CT 3 days ago.   Electronically Signed   By: Hulan Saashomas  Lawrence M.D.   On: 11/20/2014 11:40    Scheduled Meds: . amLODipine  10 mg Oral Daily  . atenolol  50 mg Oral Daily  . aztreonam  2 g Intravenous 3 times per day  . clorazepate  7.5 mg Oral BID  . enoxaparin (LOVENOX) injection  50 mg Subcutaneous Q24H  . levothyroxine  125 mcg Oral QAC breakfast  . metronidazole  500 mg Intravenous Q6H  . QUEtiapine  50 mg Oral QHS  . triamcinolone ointment  1 application Topical Daily   Continuous Infusions:     Marinda ElkFELIZ ORTIZ, ABRAHAM  [facility] Pager [phone]. If 7PM-7AM, please contact night-coverage at [email or web address], password [email or web address] 11/21/2014, 9:57  AM  LOS: 4 days

## 2014-11-21 NOTE — Progress Notes (Signed)
Patient ID: Michail JewelsSandra L Casad, female   DOB: 05/23/1961, 54 y.o.   MRN: [id number]    Subjective: Has some occasional left lower quadrant pain with bowel movements but overall feels improved.  Objective: Vital signs in last 24 hours: Temp:  [97.9 F (36.6 C)-98.6 F (37 C)] 97.9 F (36.6 C) (03/19 0535) Pulse Rate:  [57-80] 80 (03/19 0535) Resp:  [18-20] 18 (03/19 0535) BP: (108-150)/(62-92) 150/92 mmHg (03/19 0535) SpO2:  [95 %-99 %] 96 % (03/19 0535) Last BM Date: 11/21/14  Intake/Output from previous day:   Intake/Output this shift: Total I/O In: 240 [P.O.:240] Out: -   General appearance: alert, cooperative and no distress GI: mild localized left lower quadrant tenderness without guarding. Exam clearly improved from yesterday.  Lab Results:  No results for input(s): WBC, HGB, HCT, PLT in the last 72 hours. BMET No results for input(s): NA, K, CL, CO2, GLUCOSE, BUN, CREATININE, CALCIUM in the last 72 hours.   Studies/Results: Ct Abdomen Pelvis W Contrast  11/20/2014   CLINICAL DATA:  Followup sigmoid diverticulitis. Acute bilateral lower quadrant abdominal pain radiating to the back. Leukocytosis. Nausea. Surgical history includes hysterectomy.  EXAM: CT ABDOMEN AND PELVIS WITH CONTRAST  TECHNIQUE: Multidetector CT imaging of the abdomen and pelvis was performed using the standard protocol following bolus administration of intravenous contrast.  CONTRAST:  100mL OMNIPAQUE IOHEXOL 300 MG/ML IV. Oral contrast was also administered.  COMPARISON:  11/17/2014.  FINDINGS: Since the examination 3 days ago, interval development of an approximate 2.8 x 2.8 x 2.8 cm multiloculated fluid collection which likely involves the wall of the distal sigmoid colon in the area of diverticulitis. Marked thickening of the wall of the distal sigmoid colon persists, and there is extensive edema/inflammation in the surrounding fat. No evidence of abscess elsewhere.  Diverticulosis involving the descending  and sigmoid colon. Remainder of the colon unremarkable. No evidence of colonic obstruction despite the luminal narrowing in the distal sigmoid colon. Small bowel normal in appearance. Stomach decompressed and unremarkable.  Normal early portal venous phase appearance of the liver, spleen, pancreas, adrenal glands and kidneys. Gallbladder unremarkable by CT. No biliary ductal dilation. No visible aortoiliofemoral atherosclerosis. Widely patent visceral arteries. No significant lymphadenopathy.  Uterus surgically absent. No adnexal masses. Urinary bladder unremarkable.  Bone window images demonstrate degenerative disc disease and spondylosis at L5-S 1, facet degenerative changes throughout the lumbar spine, and mild degenerative changes in the sacroiliac joints. Visualized lung bases clear. Heart size upper normal to perhaps slightly enlarged.  IMPRESSION: 1. Interval development of an approximate 3 cm intramural abscess involving the right lateral wall of the distal sigmoid colon in the area of acute diverticulitis. 2. Persistent marked thickening of the wall of the distal sigmoid colon with associated luminal narrowing, though there is no evidence of colonic obstruction. 3. No new abnormalities otherwise since the CT 3 days ago.   Electronically Signed   By: Hulan Saashomas  Lawrence M.D.   On: 11/20/2014 11:40    Anti-infectives: Anti-infectives    Start     Dose/Rate Route Frequency Ordered Stop   11/17/14 1400  aztreonam (AZACTAM) 2 g in dextrose 5 % 50 mL IVPB     2 g 100 mL/hr over 30 Minutes Intravenous 3 times per day 11/17/14 1320     11/17/14 1315  metroNIDAZOLE (FLAGYL) IVPB 500 mg     500 mg 100 mL/hr over 60 Minutes Intravenous Every 6 hours 11/17/14 1305     11/17/14 1300  metroNIDAZOLE (FLAGYL) IVPB  500 mg  Status:  Discontinued     500 mg 100 mL/hr over 60 Minutes Intravenous Every 6 hours 11/17/14 1246 11/17/14 1255      Assessment/Plan: Diverticulitis with small, 2.8 cm abscess in the wall  of the sigmoid colon. Continues to improve on IV antibiotics. Continue current treatment for now. As well as she is doing if she continues to improve tomorrow could consider discharge on oral antibiotics and follow-up CT scan in 2-3 weeks     LOS: 4 days    Demonte Dobratz T 11/21/2014

## 2014-11-22 LAB — STOOL CULTURE: Special Requests: NORMAL

## 2014-11-22 MED ORDER — OXYCODONE HCL 5 MG PO TABS
10.0000 mg | ORAL_TABLET | ORAL | Status: DC | PRN
  Administered 2014-11-23 – 2014-11-26 (×7): 10 mg via ORAL
  Filled 2014-11-22 (×7): qty 2

## 2014-11-22 MED ORDER — METRONIDAZOLE 500 MG PO TABS
500.0000 mg | ORAL_TABLET | Freq: Three times a day (TID) | ORAL | Status: DC
Start: 2014-11-22 — End: 2014-11-23
  Administered 2014-11-22 – 2014-11-23 (×2): 500 mg via ORAL
  Filled 2014-11-22 (×5): qty 1

## 2014-11-22 MED ORDER — LEVOFLOXACIN 500 MG PO TABS
500.0000 mg | ORAL_TABLET | Freq: Every day | ORAL | Status: DC
  Administered 2014-11-22 – 2014-11-23 (×2): 500 mg via ORAL
  Filled 2014-11-22 (×2): qty 1

## 2014-11-22 NOTE — Progress Notes (Signed)
Patient complains of nausea and "panic attacks" after taking dose of levaquin. RN provided support and suggested that patient try to eat something with pills to reduce nausea.

## 2014-11-22 NOTE — Progress Notes (Signed)
Patient ID: Brooke JewelsSandra L Hester, female   DOB: 07/19/1961, 54 y.o.   MRN: [id number]  General [facility]  HD#: [id number]  Subjective: Patient in bed, family at bedside.  Tolerating diet.  Still some pain, but some back pain as well.  Objective: Vital signs in last 24 hours: Temp:  [98.1 F (36.7 C)-98.6 F (37 C)] 98.1 F (36.7 C) (03/20 0510) Pulse Rate:  [54-65] 54 (03/20 0510) Resp:  [18] 18 (03/20 0510) BP: (87-127)/(47-80) 114/60 mmHg (03/20 0520) SpO2:  [95 %-99 %] 95 % (03/20 0510) Last BM Date: 11/21/14  Intake/Output from previous day: 03/19 0701 - 03/20 0700 In: 240 [P.O.:240] Out: -  Intake/Output this shift:    Physical Exam: HEENT - sclerae clear, mucous membranes moist Neck - soft Chest - clear bilaterally Cor - RRR Abdomen - soft without distension; mild tenderness LLQ; no mass; no guarding Ext - no edema, non-tender Neuro - alert & oriented, no focal deficits  Lab Results:  No results for input(s): WBC, HGB, HCT, PLT in the last 72 hours. BMET No results for input(s): NA, K, CL, CO2, GLUCOSE, BUN, CREATININE, CALCIUM in the last 72 hours. PT/INR No results for input(s): LABPROT, INR in the last 72 hours. Comprehensive Metabolic Panel:    Component Value Date/Time   NA 141 11/18/2014 0615   NA 137 11/17/2014 0920   K 4.0 11/18/2014 0615   K 4.0 11/17/2014 0920   CL 101 11/18/2014 0615   CL 100 11/17/2014 0920   CO2 29 11/18/2014 0615   CO2 29 11/17/2014 0920   BUN 10 11/18/2014 0615   BUN 6 11/17/2014 0920   CREATININE 0.91 11/18/2014 0615   CREATININE 0.79 11/17/2014 0920   GLUCOSE 111* 11/18/2014 0615   GLUCOSE 127* 11/17/2014 0920   CALCIUM 8.9 11/18/2014 0615   CALCIUM 9.2 11/17/2014 0920   AST 16 11/17/2014 0920   ALT 14 11/17/2014 0920   ALKPHOS 108 11/17/2014 0920   BILITOT 1.1 11/17/2014 0920   PROT 7.1 11/17/2014 0920   ALBUMIN 4.2 11/17/2014 0920    Studies/Results: Ct Abdomen Pelvis W Contrast  11/20/2014    CLINICAL DATA:  Followup sigmoid diverticulitis. Acute bilateral lower quadrant abdominal pain radiating to the back. Leukocytosis. Nausea. Surgical history includes hysterectomy.  EXAM: CT ABDOMEN AND PELVIS WITH CONTRAST  TECHNIQUE: Multidetector CT imaging of the abdomen and pelvis was performed using the standard protocol following bolus administration of intravenous contrast.  CONTRAST:  100mL OMNIPAQUE IOHEXOL 300 MG/ML IV. Oral contrast was also administered.  COMPARISON:  11/17/2014.  FINDINGS: Since the examination 3 days ago, interval development of an approximate 2.8 x 2.8 x 2.8 cm multiloculated fluid collection which likely involves the wall of the distal sigmoid colon in the area of diverticulitis. Marked thickening of the wall of the distal sigmoid colon persists, and there is extensive edema/inflammation in the surrounding fat. No evidence of abscess elsewhere.  Diverticulosis involving the descending and sigmoid colon. Remainder of the colon unremarkable. No evidence of colonic obstruction despite the luminal narrowing in the distal sigmoid colon. Small bowel normal in appearance. Stomach decompressed and unremarkable.  Normal early portal venous phase appearance of the liver, spleen, pancreas, adrenal glands and kidneys. Gallbladder unremarkable by CT. No biliary ductal dilation. No visible aortoiliofemoral atherosclerosis. Widely patent visceral arteries. No significant lymphadenopathy.  Uterus surgically absent. No adnexal masses. Urinary bladder unremarkable.  Bone window images demonstrate degenerative disc disease and spondylosis at L5-S 1, facet degenerative changes  throughout the lumbar spine, and mild degenerative changes in the sacroiliac joints. Visualized lung bases clear. Heart size upper normal to perhaps slightly enlarged.  IMPRESSION: 1. Interval development of an approximate 3 cm intramural abscess involving the right lateral wall of the distal sigmoid colon in the area of acute  diverticulitis. 2. Persistent marked thickening of the wall of the distal sigmoid colon with associated luminal narrowing, though there is no evidence of colonic obstruction. 3. No new abnormalities otherwise since the CT 3 days ago.   Electronically Signed   By: Hulan Saas M.D.   On: 11/20/2014 11:40    Anti-infectives: Anti-infectives    Start     Dose/Rate Route Frequency Ordered Stop   11/17/14 1400  aztreonam (AZACTAM) 2 g in dextrose 5 % 50 mL IVPB     2 g 100 mL/hr over 30 Minutes Intravenous 3 times per day 11/17/14 1320     11/17/14 1315  metroNIDAZOLE (FLAGYL) IVPB 500 mg     500 mg 100 mL/hr over 60 Minutes Intravenous Every 6 hours 11/17/14 1305     11/17/14 1300  metroNIDAZOLE (FLAGYL) IVPB 500 mg  Status:  Discontinued     500 mg 100 mL/hr over 60 Minutes Intravenous Every 6 hours 11/17/14 1246 11/17/14 1255      Assessment & Plans: Acute diverticulitis with small abscess  Improving on abx  Changing to oral abx today  Anticipate discharge home tomorrow on oral abx  Follow up CT scan should be arranged for 2-3 weeks  Will follow  Velora Heckler, MD, [facility] Office: [phone]   [name] Judie Petit 11/22/2014

## 2014-11-22 NOTE — Progress Notes (Signed)
[facility] PROGRESS NOTE  Assessment/Plan: Acute Diverticulitis - On admission she was started on IV ciprofloxacin and Flagyl, since she is tolerating her diet on 11/22/2014 we'll change her to oral ciprofloxacin and Flagyl. - CT scan of the abdomen and pelvis on 11/20/2014 showed a 2.8 cm abscess surgery was consulted recommended to continue current management and repeat a CT scan in 2-3 weeks. - Probably home in 24 hour .  Essential hypertension: - Stable, continue current home regimen.   Bipolar disorder/depression/anxiety: - Stable continue Seroquel and clorazepate  Code Status: full Family Communication: husband  Disposition Plan: inpatient   Consultants:  none  Procedures:  CT abd and pelvis  Antibiotics:  Aztreonam and flagyl 3.15.2016  HPI/Subjective: No complains.  Objective: Filed Vitals:   11/21/14 1419 11/21/14 2156 11/22/14 0510 11/22/14 0520  BP: 100/50 127/80 87/47 114/60  Pulse: 65 64 54   Temp: 98.6 F (37 C) 98.2 F (36.8 C) 98.1 F (36.7 C)   TempSrc: Oral Oral Oral   Resp: 18 18 18    Height:      Weight:      SpO2: 97% 99% 95%     Intake/Output Summary (Last 24 hours) at 11/22/14 0814 Last data filed at 11/21/14 0847  Gross per 24 hour  Intake    240 ml  Output      0 ml  Net    240 ml   Filed Weights   11/17/14 0839  Weight: 92.987 kg (205 lb)    Exam:  General: Alert, awake, oriented x3, in no acute distress.  HEENT: No bruits, no goiter.  Heart: Regular rate and rhythm. Lungs: Good air movement, clear Abdomen: Soft, nontender, nondistended, positive bowel sounds.  Neuro: Grossly intact, nonfocal.   Data Reviewed: Basic Metabolic Panel:  Recent Labs Lab 11/17/14 0920 11/18/14 0615  NA 137 141  K 4.0 4.0  CL 100 101  CO2 29 29  GLUCOSE 127* 111*  BUN 6 10  CREATININE 0.79 0.91  CALCIUM 9.2 8.9   Liver Function Tests:  Recent Labs Lab 11/17/14 0920  AST 16  ALT 14  ALKPHOS 108  BILITOT 1.1   PROT 7.1  ALBUMIN 4.2    Recent Labs Lab 11/17/14 0920  LIPASE 13   No results for input(s): AMMONIA in the last 168 hours. CBC:  Recent Labs Lab 11/17/14 0920 11/18/14 0615  WBC 12.9* 9.1  NEUTROABS 10.8*  --   HGB 14.5 11.9*  HCT 44.7 37.5  MCV 89.0 89.7  PLT 266 237   Cardiac Enzymes: No results for input(s): CKTOTAL, CKMB, CKMBINDEX, TROPONINI in the last 168 hours. BNP (last 3 results) No results for input(s): BNP in the last 8760 hours.  ProBNP (last 3 results) No results for input(s): PROBNP in the last 8760 hours.  CBG: No results for input(s): GLUCAP in the last 168 hours.  Recent Results (from the past 240 hour(s))  Stool culture     Status: None (Preliminary result)   Collection Time: 11/18/14  2:37 AM  Result Value Ref Range Status   Specimen Description STOOL  Final   Special Requests Normal  Final   Culture   Final    NO SUSPICIOUS COLONIES, CONTINUING TO HOLD [facility]    Report Status PENDING  Incomplete  Clostridium Difficile by PCR     Status: None   Collection Time: 11/18/14  2:37 AM  Result Value Ref Range Status   C difficile by pcr NEGATIVE  NEGATIVE Final     Studies: Ct Abdomen Pelvis W Contrast  11/20/2014   CLINICAL DATA:  Followup sigmoid diverticulitis. Acute bilateral lower quadrant abdominal pain radiating to the back. Leukocytosis. Nausea. Surgical history includes hysterectomy.  EXAM: CT ABDOMEN AND PELVIS WITH CONTRAST  TECHNIQUE: Multidetector CT imaging of the abdomen and pelvis was performed using the standard protocol following bolus administration of intravenous contrast.  CONTRAST:  OMNIPAQUE IOHEXOL 300 MG/ML IV. Oral contrast was also administered.  COMPARISON:  11/17/2014.  FINDINGS: Since the examination 3 days ago, interval development of an approximate 2.8 x 2.8 x 2.8 cm multiloculated fluid collection which likely involves the wall of the distal sigmoid colon in the area of  diverticulitis. Marked thickening of the wall of the distal sigmoid colon persists, and there is extensive edema/inflammation in the surrounding fat. No evidence of abscess elsewhere.  Diverticulosis involving the descending and sigmoid colon. Remainder of the colon unremarkable. No evidence of colonic obstruction despite the luminal narrowing in the distal sigmoid colon. Small bowel normal in appearance. Stomach decompressed and unremarkable.  Normal early portal venous phase appearance of the liver, spleen, pancreas, adrenal glands and kidneys. Gallbladder unremarkable by CT. No biliary ductal dilation. No visible aortoiliofemoral atherosclerosis. Widely patent visceral arteries. No significant lymphadenopathy.  Uterus surgically absent. No adnexal masses. Urinary bladder unremarkable.  Bone window images demonstrate degenerative disc disease and spondylosis at L5-S 1, facet degenerative changes throughout the lumbar spine, and mild degenerative changes in the sacroiliac joints. Visualized lung bases clear. Heart size upper normal to perhaps slightly enlarged.  IMPRESSION: 1. Interval development of an approximate 3 cm intramural abscess involving the right lateral wall of the distal sigmoid colon in the area of acute diverticulitis. 2. Persistent marked thickening of the wall of the distal sigmoid colon with associated luminal narrowing, though there is no evidence of colonic obstruction. 3. No new abnormalities otherwise since the CT 3 days ago.   Electronically Signed   By: Hulan Saas M.D.   On: 11/20/2014 11:40    Scheduled Meds: . amLODipine  10 mg Oral Daily  . atenolol  50 mg Oral Daily  . aztreonam  2 g Intravenous 3 times per day  . clorazepate  7.5 mg Oral BID  . enoxaparin (LOVENOX) injection  50 mg Subcutaneous Q24H  . levothyroxine  125 mcg Oral QAC breakfast  . metronidazole  500 mg Intravenous Q6H  . QUEtiapine  50 mg Oral QHS  . triamcinolone ointment  1 application Topical Daily     Continuous Infusions:     Marinda Elk  [facility] Pager [phone]. If 7PM-7AM, please contact night-coverage at [email or web address], password [email or web address] 11/22/2014, 8:14 AM  LOS: 5 days

## 2014-11-23 ENCOUNTER — Encounter (HOSPITAL_COMMUNITY): Payer: Self-pay | Admitting: Radiology

## 2014-11-23 ENCOUNTER — Ambulatory Visit (HOSPITAL_COMMUNITY): Payer: Medicaid Other

## 2014-11-23 DIAGNOSIS — I10 Essential (primary) hypertension: Secondary | ICD-10-CM

## 2014-11-23 DIAGNOSIS — K5792 Diverticulitis of intestine, part unspecified, without perforation or abscess without bleeding: Secondary | ICD-10-CM

## 2014-11-23 DIAGNOSIS — K5720 Diverticulitis of large intestine with perforation and abscess without bleeding: Principal | ICD-10-CM

## 2014-11-23 LAB — CBC WITH DIFFERENTIAL/PLATELET
Basophils Absolute: 0 10*3/uL (ref 0.0–0.1)
Basophils Relative: 1 % (ref 0–1)
Eosinophils Absolute: 0.1 10*3/uL (ref 0.0–0.7)
Eosinophils Relative: 1 % (ref 0–5)
HCT: 38 % (ref 36.0–46.0)
Hemoglobin: 12 g/dL (ref 12.0–15.0)
Lymphocytes Relative: 29 % (ref 12–46)
Lymphs Abs: 1.4 10*3/uL (ref 0.7–4.0)
MCH: 28.5 pg (ref 26.0–34.0)
MCHC: 31.6 g/dL (ref 30.0–36.0)
MCV: 90.3 fL (ref 78.0–100.0)
Monocytes Absolute: 0.4 10*3/uL (ref 0.1–1.0)
Monocytes Relative: 7 % (ref 3–12)
Neutro Abs: 3 10*3/uL (ref 1.7–7.7)
Neutrophils Relative %: 62 % (ref 43–77)
Platelets: 285 10*3/uL (ref 150–400)
RBC: 4.21 MIL/uL (ref 3.87–5.11)
RDW: 14.8 % (ref 11.5–15.5)
WBC: 4.9 10*3/uL (ref 4.0–10.5)

## 2014-11-23 MED ORDER — FLUCONAZOLE IN SODIUM CHLORIDE 200-0.9 MG/100ML-% IV SOLN
200.0000 mg | INTRAVENOUS | Status: DC
  Administered 2014-11-23 – 2014-11-25 (×3): 200 mg via INTRAVENOUS
  Filled 2014-11-23 (×4): qty 100

## 2014-11-23 MED ORDER — IOHEXOL 300 MG/ML  SOLN
100.0000 mL | Freq: Once | INTRAMUSCULAR | Status: AC | PRN
  Administered 2014-11-23: 100 mL via INTRAVENOUS

## 2014-11-23 MED ORDER — SODIUM CHLORIDE 0.9 % IV SOLN
500.0000 mg | Freq: Four times a day (QID) | INTRAVENOUS | Status: DC
  Administered 2014-11-23 – 2014-11-25 (×8): 500 mg via INTRAVENOUS
  Filled 2014-11-23 (×9): qty 500

## 2014-11-23 MED ORDER — IOHEXOL 300 MG/ML  SOLN
25.0000 mL | INTRAMUSCULAR | Status: AC
  Administered 2014-11-23: 25 mL via ORAL
  Administered 2014-11-23: 50 mL via ORAL

## 2014-11-23 NOTE — Progress Notes (Signed)
[facility] PROGRESS NOTE  SHANDREA LUSK ZOX:[id number] DOB: 28-Mar-1961 DOA: 11/17/2014 PCP: [facility]  Brief narrative 54 year old female with history of HTN, dysrhythmia, hypothyroidism, bipolar disorder, depression, anxiety who presented with abdominal pain secondary to distal sigmoid diverticulitis without abscess. In the ED patient had low-grade fever and mild leukocytosis. CT abd/pelvis demonstrated distal sigmoid diverticulitis and upper rectal proctitis with ill-defined fluid surrounding without well-defined abscess. + mesenteric thickening. Patient admitted with supportive care and empiric antibiotic but symptoms worsened and follow-up CT scan showed an interval development of 3 cm intramural abscess over the right lateral wall of the distal sigmoid colon in the area of acute diverticulitis. Hospital course prolonged due to persistent worsening pain and not tolerating advanced diet.   Assessment/Plan: Acute sigmoid diverticulitis with abscess Symptoms have not improved with supportive care, has not tolerated diet advanced yesterday due to nausea. Surgery have ordered a repeat CT of the abdomen and antibiotic switched to IV Primaxin. Diet deescalated to clear liquid. Continue when necessary oxycodone. When necessary Zofran for nausea.  Hypothyroidism Continue Synthroid   Bipolar disorder Continue Seroquel.  Essential hypertension Continue atenolol  Diet: Clear liquid  DVT prophylaxis: Subcutaneous Lovenox  Code Status: full code  Family Communication: Husband at bedside  Disposition Plan: Continue inpatient monitoring   Consultants:  Surgery  Procedures:  CT abdomen and pelvis ( 3/15. 3/18 and 3/21)  Antibiotics:  IV cipro and flagyl 3/15-3/21  IV primaxin 3/21--  HPI/Subjective: Vision seen and examined. Reports ongoing right lower quadrant and suprapubic pain associated nausea. Was not able to tolerate advanced diet  yesterday.  Objective: Filed Vitals:   11/23/14 0534  BP: 139/79  Pulse: 71  Temp: 98.3 F (36.8 C)  Resp: 16    Intake/Output Summary (Last 24 hours) at 11/23/14 1353 Last data filed at 11/23/14 0934  Gross per 24 hour  Intake    480 ml  Output      0 ml  Net    480 ml   Filed Weights   11/17/14 0839  Weight: 92.987 kg (205 lb)    Exam:   General:  Middle aged female in no acute distress  HEENT: No pallor, mucosa, supple neck  Chest: Clear to auscultation bilaterally  CVS: Normal S1 and S2, no murmurs  GI: Soft, nondistended, bowel sounds present, tender to palpation over right lower quadrant and suprapubic area  Musculoskeletal: Warm, no edema  CNS: Alert and oriented  Data Reviewed: Basic Metabolic Panel:  Recent Labs Lab 11/17/14 0920 11/18/14 0615  NA 137 141  K 4.0 4.0  CL 100 101  CO2 29 29  GLUCOSE 127* 111*  BUN 6 10  CREATININE 0.79 0.91  CALCIUM 9.2 8.9   Liver Function Tests:  Recent Labs Lab 11/17/14 0920  AST 16  ALT 14  ALKPHOS 108  BILITOT 1.1  PROT 7.1  ALBUMIN 4.2    Recent Labs Lab 11/17/14 0920  LIPASE 13   No results for input(s): AMMONIA in the last 168 hours. CBC:  Recent Labs Lab 11/17/14 0920 11/18/14 0615 11/23/14 1151  WBC 12.9* 9.1 4.9  NEUTROABS 10.8*  --  3.0  HGB 14.5 11.9* 12.0  HCT 44.7 37.5 38.0  MCV 89.0 89.7 90.3  PLT 266 237 285   Cardiac Enzymes: No results for input(s): CKTOTAL, CKMB, CKMBINDEX, TROPONINI in the last 168 hours. BNP (last 3 results) No results for input(s): BNP in the last 8760 hours.  ProBNP (last 3 results) No  results for input(s): PROBNP in the last 8760 hours.  CBG: No results for input(s): GLUCAP in the last 168 hours.  Recent Results (from the past 240 hour(s))  Stool culture     Status: None   Collection Time: 11/18/14  2:37 AM  Result Value Ref Range Status   Specimen Description STOOL  Final   Special Requests Normal  Final   Culture   Final    NO  SALMONELLA, SHIGELLA, CAMPYLOBACTER, YERSINIA, OR E.COLI 0157:H7 ISOLATED Performed at [facility]    Report Status 11/22/2014 FINAL  Final  Clostridium Difficile by PCR     Status: None   Collection Time: 11/18/14  2:37 AM  Result Value Ref Range Status   C difficile by pcr NEGATIVE NEGATIVE Final     Studies: No results found.  Scheduled Meds: . amLODipine  10 mg Oral Daily  . atenolol  50 mg Oral Daily  . clorazepate  7.5 mg Oral BID  . enoxaparin (LOVENOX) injection  50 mg Subcutaneous Q24H  . imipenem-cilastatin  500 mg Intravenous 4 times per day  . iohexol  25 mL Oral Q1 Hr x 2  . levothyroxine  125 mcg Oral QAC breakfast  . QUEtiapine  50 mg Oral QHS  . triamcinolone ointment  1 application Topical Daily   Continuous Infusions:     Time spent: 25 minutes    Andrew Soria  [facility] Pager [phone] If 7PM-7AM, please contact night-coverage at [email or web address], password [email or web address] 11/23/2014, 1:53 PM  LOS: 6 days

## 2014-11-23 NOTE — Progress Notes (Signed)
Patient ID: Brooke JewelsSandra L Tilley, female   DOB: 06/27/1961, 54 y.o.   MRN: [id number]     [facility]      [address]   [place], [place] [id number]    Phone: [phone] FAX: [phone]     Subjective: Still with moderate pain, lower abdomen and rectum.  Afebrile.  VSS.  On PO atbx and soft diet, but not taking much in and c/o pain.  Having.  BMs.  Objective:  Vital signs:  Filed Vitals:   11/22/14 0520 11/22/14 1458 11/22/14 2136 11/23/14 0534  BP: 114/60 99/57 137/65 139/79  Pulse:  61 63 71  Temp:  98.5 F (36.9 C) 98.4 F (36.9 C) 98.3 F (36.8 C)  TempSrc:  Oral Oral Oral  Resp:  18 18 16   Height:      Weight:      SpO2:  100% 98% 98%    Last BM Date: 11/22/14  Intake/Output   Yesterday:  03/20 0701 - 03/21 0700 In: 480 [P.O.:480] Out: -  This shift:  Total I/O In: 240 [P.O.:240] Out: -   Physical Exam: General: Pt awake/alert/oriented x4 in no acute distress Abdomen: Soft.  Nondistended.  TTP lower abdomen, RLQ.  No evidence of peritonitis.  No incarcerated hernias.    Problem List:   Active Problems:   Diverticulitis   Leukocytosis   Essential hypertension    Results:   Labs: No results found for this or any previous visit (from the past 48 hour(s)).  Imaging / Studies: No results found.  Medications / Allergies:  Scheduled Meds: . amLODipine  10 mg Oral Daily  . atenolol  50 mg Oral Daily  . clorazepate  7.5 mg Oral BID  . enoxaparin (LOVENOX) injection  50 mg Subcutaneous Q24H  . levofloxacin  500 mg Oral Daily  . levothyroxine  125 mcg Oral QAC breakfast  . metroNIDAZOLE  500 mg Oral 3 times per day  . QUEtiapine  50 mg Oral QHS  . triamcinolone ointment  1 application Topical Daily   Continuous Infusions:  PRN Meds:.albuterol, naphazoline, ondansetron **OR** ondansetron (ZOFRAN) IV, oxyCODONE  Antibiotics: Anti-infectives    Start     Dose/Rate Route Frequency Ordered Stop   11/22/14 2200  metroNIDAZOLE (FLAGYL) tablet 500 mg     500 mg Oral 3 times per day 11/22/14 1502     11/22/14 1600  levofloxacin (LEVAQUIN) tablet 500 mg     500 mg Oral Daily 11/22/14 1502     11/17/14 1400  aztreonam (AZACTAM) 2 g in dextrose 5 % 50 mL IVPB  Status:  Discontinued     2 g 100 mL/hr over 30 Minutes Intravenous 3 times per day 11/17/14 1320 11/22/14 1502   11/17/14 1315  metroNIDAZOLE (FLAGYL) IVPB 500 mg  Status:  Discontinued     500 mg 100 mL/hr over 60 Minutes Intravenous Every 6 hours 11/17/14 1305 11/22/14 1502   11/17/14 1300  metroNIDAZOLE (FLAGYL) IVPB 500 mg  Status:  Discontinued     500 mg 100 mL/hr over 60 Minutes Intravenous Every 6 hours 11/17/14 1246 11/17/14 1255        Assessment/Plan Acute diverticulitis with small intramural abscess -persistent, worsening pain.  No acute abdomen. Will repeat her labs.  Last CT of abdomen and pelvis 3/18, will repeat -back down to clears -failed cipro/flagyl start meropenem   Ashok NorrisEmina Khamron Gellert, [facility]NP-BC [facility] Pager [phone](7A-4:30P)   11/23/2014 11:04 AM

## 2014-11-23 NOTE — Progress Notes (Signed)
ANTIBIOTIC CONSULT NOTE - INITIAL  Pharmacy Consult for imipenem Indication: intra-abdominal  Allergies  Allergen Reactions  . Benazepril Anaphylaxis, Hives and Itching  . Penicillins Hives    Has itching and some swelling in face  . Sulfa Antibiotics Hives    Itching and facial swelling  . Ciprofloxacin Hcl Hives    "funny sensation in gums"  . Clindamycin/Lincomycin Hives    Itching, welps  . Erythromycin Hives    itching  . Other     Pt has problems with being awaken from anesthesia, Her blood pressure drops very low  . Zithromax [Azithromycin] Hives    itching    Patient Measurements: Height:  (172.7 cm) Weight: 205 lb (92.987 kg) IBW/kg (Calculated) : 63.9 Adjusted Body Weight:   Vital Signs: Temp: 98.3 F (36.8 C) (03/21 0534) Temp Source: Oral (03/21 0534) BP: 139/79 mmHg (03/21 0534) Pulse Rate: 71 (03/21 0534) Intake/Output from previous day: 03/20 0701 - 03/21 0700 In: 480 [P.O.:480] Out: -  Intake/Output from this shift: Total I/O In: 240 [P.O.:240] Out: -   Labs:  Recent Labs  11/23/14 1151  WBC 4.9  HGB 12.0  PLT 285   Estimated Creatinine Clearance: 83.3 mL/min (by C-G formula based on Cr of 0.91). No results for input(s): VANCOTROUGH, VANCOPEAK, VANCORANDOM, GENTTROUGH, GENTPEAK, GENTRANDOM, TOBRATROUGH, TOBRAPEAK, TOBRARND, AMIKACINPEAK, AMIKACINTROU, AMIKACIN in the last 72 hours.   Microbiology: Recent Results (from the past 720 hour(s))  Stool culture     Status: None   Collection Time: 11/18/14  2:37 AM  Result Value Ref Range Status   Specimen Description STOOL  Final   Special Requests Normal  Final   Culture   Final    NO SALMONELLA, SHIGELLA, CAMPYLOBACTER, YERSINIA, OR E.COLI 0157:H7 ISOLATED Performed at [facility]    Report Status 11/22/2014 FINAL  Final  Clostridium Difficile by PCR     Status: None   Collection Time: 11/18/14  2:37 AM  Result Value Ref Range Status   C difficile by pcr NEGATIVE  NEGATIVE Final    Medical History: Past Medical History  Diagnosis Date  . Complication of anesthesia     difficult to wake up and blood pressure drops  . Heart murmur   . Hypertension     sees Dr. Shana Chute, is patients primary  . Dysrhythmia     no medication treatment  . Chronic bronchitis   . Pneumonia     hx  . Hypothyroidism     takes synthroid  . Anemia   . Headache(784.0)   . Arthritis   . Bursitis     hx of  . Mental disorder     bipolar  . Anxiety   . Depression   . Eczema   . GERD (gastroesophageal reflux disease)    Assessment: [name] presents with abdominal pain on 3/15, started on aztreonam/ metronidazole then aztreonam changed to levofloxacin for diverticulitis.  3/15 CT shows distal sigmoid diverticulitis and upper rectal proctitis, fluid surrounding, but no well-defined abscess currently. No perforations. The 3/18 repeat CT Interval development of an approximate 3 cm intramural abscess involving the right lateral wall of the distal sigmoid colon in the area of acute diverticulitis. History of multiple antibiotic intolerances/allergies.  3/15 >> Flagyl >> 3/21 3/15 >> Aztreonam >> 3/20 3/20 >> Levaquin >> 3/21 3/21 >> imipenem >>  Tmax: AF WBCs: WNL  Renal: SCr WNL, normalized CrCl = 53ml/min  3/16 cdiff (-) 3/16 stool cx:  Unrevealing  Goal of Therapy:  Dose for renal function and weight  Plan:   Imipenem 500mg  IV q6h  Pharmacy will follow at distance  Juliette Alcideustin Laiken Nohr, PharmD, BCPS.   Pager: [phone] 11/23/2014,12:59 PM

## 2014-11-24 LAB — BASIC METABOLIC PANEL
Anion gap: 7 (ref 5–15)
BUN: 6 mg/dL (ref 6–23)
CO2: 31 mmol/L (ref 19–32)
Calcium: 8.9 mg/dL (ref 8.4–10.5)
Chloride: 101 mmol/L (ref 96–112)
Creatinine, Ser: 0.78 mg/dL (ref 0.50–1.10)
GFR calc Af Amer: >90 mL/min (ref >90)
GFR calc non Af Amer: >90 mL/min (ref >90)
Glucose, Bld: 97 mg/dL (ref 70–99)
Potassium: 3.8 mmol/L (ref 3.5–5.1)
Sodium: 139 mmol/L (ref 135–145)

## 2014-11-24 LAB — CBC
HCT: 39.2 % (ref 36.0–46.0)
Hemoglobin: 12.3 g/dL (ref 12.0–15.0)
MCH: 28.1 pg (ref 26.0–34.0)
MCHC: 31.4 g/dL (ref 30.0–36.0)
MCV: 89.7 fL (ref 78.0–100.0)
Platelets: 313 10*3/uL (ref 150–400)
RBC: 4.37 MIL/uL (ref 3.87–5.11)
RDW: 14.9 % (ref 11.5–15.5)
WBC: 5 10*3/uL (ref 4.0–10.5)

## 2014-11-24 LAB — PROTIME-INR
INR: 1.1 (ref 0.00–1.49)
Prothrombin Time: 14.4 seconds (ref 11.6–15.2)

## 2014-11-24 NOTE — Progress Notes (Signed)
Subjective: She feels better but still has some midline lower abdominal pain and some pain with flatus and BM.   She does not appear acutely ill.   Objective: Vital signs in last 24 hours: Temp:  [98 F (36.7 C)-98.5 F (36.9 C)] 98.1 F (36.7 C) (03/22 0513) Pulse Rate:  [57-64] 64 (03/22 0513) Resp:  [16-20] 20 (03/22 0513) BP: (114-138)/(61-71) 138/65 mmHg (03/22 0513) SpO2:  [99 %] 99 % (03/22 0513) Last BM Date: 11/23/14 480 PO recorded  NPO Nothing else measured Afebrile, VSS Labs OK Repeat CT yesterday: Persistent acute diverticulitis of the lower sigmoid colon with an approximately 2.1 x 1.1 cm intramural abscess in the right lateral wall of the lower sigmoid colon. The overall appearance is unchanged compared with 11/20/2014. Intake/Output from previous day: 03/21 0701 - 03/22 0700 In: 480 [P.O.:480] Out: -  Intake/Output this shift:    General appearance: alert, cooperative and no distress GI: soft, she has pain mid lower abdomen but not really tender on exam  Lab Results:   Recent Labs  11/23/14 1151 11/24/14 0521  WBC 4.9 5.0  HGB 12.0 12.3  HCT 38.0 39.2  PLT 285 313    BMET  Recent Labs  11/24/14 0521  NA 139  K 3.8  CL 101  CO2 31  GLUCOSE 97  BUN 6  CREATININE 0.78  CALCIUM 8.9   PT/INR  Recent Labs  11/24/14 0521  LABPROT 14.4  INR 1.10    No results for input(s): AST, ALT, ALKPHOS, BILITOT, PROT, ALBUMIN in the last 168 hours.   Lipase     Component Value Date/Time   LIPASE 13 11/17/2014 0920     Studies/Results: Ct Abdomen Pelvis W Contrast  11/23/2014   CLINICAL DATA:  Is pole diverticulitis, moderate lower abdominal pain and rectal pain. Afebrile. Patient is on p.o. antibiotics.  EXAM: CT ABDOMEN AND PELVIS WITH CONTRAST  TECHNIQUE: Multidetector CT imaging of the abdomen and pelvis was performed using the standard protocol following bolus administration of intravenous contrast.  CONTRAST:  100 mL Omnipaque 300   COMPARISON:  11/20/2014  FINDINGS: The lung bases are clear.  The liver demonstrates no focal abnormality. There is no intrahepatic or extrahepatic biliary ductal dilatation. The gallbladder is normal. The spleen demonstrates no focal abnormality. The kidneys, adrenal glands and pancreas are normal. The bladder is unremarkable.  Lower sigmoid coal and persistent severe bowel wall thickening and surrounding inflammatory changes most consistent with acute diverticulitis. There is a 2.1 x 1.1 cm hypodensity in the right lateral wall of the lower sigmoid colon most concerning for an intramural abscess which is similar in appearance to the prior exam. There is no pneumoperitoneum, pneumatosis, or portal venous gas. There is no significant abdominal free fluid. There is no lymphadenopathy.  The abdominal aorta is normal in caliber .  There are no lytic or sclerotic osseous lesions. There is degenerative disc disease at L5-S1 with a broad-based disc osteophyte complex and bilateral facet arthropathy. There is bilateral facet arthropathy at L4-5. 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  IMPRESSION: 1. Persistent acute diverticulitis of the lower sigmoid colon with an approximately 2.1 x 1.1 cm intramural abscess in the right lateral wall of the lower sigmoid colon. The overall appearance is unchanged compared with 11/20/2014.   Electronically Signed   By: Elige KoHetal  Patel   On: 11/23/2014 19:00    Medications: . amLODipine  10 mg Oral Daily  . atenolol  50 mg Oral Daily  .  clorazepate  7.5 mg Oral BID  . enoxaparin (LOVENOX) injection  50 mg Subcutaneous Q24H  . fluconazole (DIFLUCAN) IV  200 mg Intravenous Q24H  . imipenem-cilastatin  500 mg Intravenous 4 times per day  . levothyroxine  125 mcg Oral QAC breakfast  . QUEtiapine  50 mg Oral QHS  . triamcinolone ointment  1 application Topical Daily    Assessment/Plan 1.  Acute diverticulitis with small intramural abscess 2.8 x 2.8 x 2.8cm, repeat CT  yesterday shows: 2.1 x 1.1  Cm fluid collection, not amenable to drainage. 2. Hypertension 3. Bipolar disorder/Anxiety/Depression 4. GERD 5. Hypothyroid 6. Chronic back pain 7. Multiple Medicine allergies (5 are antibiotics) 8. Chronic bronchitis/ongoing tobacco use/hx of pneumonia 9.  Day 7 of antibiotics/fluconazole added 11/23/14. 1.  Lovenox/SCD for DVT prophylaxis   Plan:  IR says site is to small and they cannot safely drain it. Continue antibiotics and clears for now.  I'm not sure what we can send her home on with all her allergies.    LOS: 7 days    [name] 11/24/2014

## 2014-11-24 NOTE — Progress Notes (Signed)
[facility] PROGRESS NOTE  Assessment/Plan: Acute Diverticulitis - On admission she was started on IV ciprofloxacin and Flagyl, since she is tolerating her diet on 11/22/2014 we'll change her to oral ciprofloxacin and Flagyl. - CT scan of the abdomen and pelvis on 11/20/2014 showed a 2.8 cm abscess surgery was consulted recommended to continue current management. - On 11/23/2014 patient abdominal pain worsen and is not tolerating diet, surgery recommended to repeat the CT scan of the abdomen and pelvis, the also recommended to switch ciprofloxacin and Flagyl to IV Primaxin. - IR to aspirate. On 3.23.2016  Essential hypertension: - Stable, continue current home regimen.   Bipolar disorder/depression/anxiety: - Stable continue Seroquel and clorazepate  Code Status: full Family Communication: husband  Disposition Plan: inpatient   Consultants:  none  Procedures:  CT abd and pelvis  Antibiotics:  On IV primaxim  HPI/Subjective: No complains.  Objective: Filed Vitals:   11/23/14 0534 11/23/14 1506 11/23/14 2127 11/24/14 0513  BP: 139/79 132/71 114/61 138/65  Pulse: 71 63 57 64  Temp: 98.3 F (36.8 C) 98.5 F (36.9 C) 98 F (36.7 C) 98.1 F (36.7 C)  TempSrc: Oral Oral Oral Oral  Resp: Height:      Weight:      SpO2: 98% 99% 99% 99%    Intake/Output Summary (Last 24 hours) at 11/24/14 0843 Last data filed at 11/23/14 1602  Gross per 24 hour  Intake    480 ml  Output      0 ml  Net    480 ml   Filed Weights   11/17/14 0839  Weight: 92.987 kg (205 lb)    Exam:  General: Alert, awake, oriented x3, in no acute distress.  HEENT: No bruits, no goiter.  Heart: Regular rate and rhythm. Lungs: Good air movement, clear Abdomen: Soft, nontender, nondistended, positive bowel sounds.  Neuro: Grossly intact, nonfocal.   Data Reviewed: Basic Metabolic Panel:  Recent Labs Lab 11/17/14 0920 11/18/14 0615 11/24/14 0521  NA 137 141 139   K 4.0 4.0 3.8  CL 100 101 101  CO2 GLUCOSE 127* 111* 97  BUN CREATININE 0.79 0.91 0.78  CALCIUM 9.2 8.9 8.9   Liver Function Tests:  Recent Labs Lab 11/17/14 0920  AST 16  ALT 14  ALKPHOS 108  BILITOT 1.1  PROT 7.1  ALBUMIN 4.2    Recent Labs Lab 11/17/14 0920  LIPASE 13   No results for input(s): AMMONIA in the last 168 hours. CBC:  Recent Labs Lab 11/17/14 0920 11/18/14 0615 11/23/14 1151 11/24/14 0521  WBC 12.9* 9.1 4.9 5.0  NEUTROABS 10.8*  --  3.0  --   HGB 14.5 11.9* 12.0 12.3  HCT 44.7 37.5 38.0 39.2  MCV 89.0 89.7 90.3 89.7  PLT 266 237 285 313   Cardiac Enzymes: No results for input(s): CKTOTAL, CKMB, CKMBINDEX, TROPONINI in the last 168 hours. BNP (last 3 results) No results for input(s): BNP in the last 8760 hours.  ProBNP (last 3 results) No results for input(s): PROBNP in the last 8760 hours.  CBG: No results for input(s): GLUCAP in the last 168 hours.  Recent Results (from the past 240 hour(s))  Stool culture     Status: None   Collection Time: 11/18/14  2:37 AM  Result Value Ref Range Status   Specimen Description STOOL  Final   Special Requests Normal  Final   Culture   Final  NO SALMONELLA, SHIGELLA, CAMPYLOBACTER, YERSINIA, OR E.COLI 0157:H7 ISOLATED Performed at [facility]    Report Status 11/22/2014 FINAL  Final  Clostridium Difficile by PCR     Status: None   Collection Time: 11/18/14  2:37 AM  Result Value Ref Range Status   C difficile by pcr NEGATIVE NEGATIVE Final     Studies: Ct Abdomen Pelvis W Contrast  11/23/2014   CLINICAL DATA:  Is pole diverticulitis, moderate lower abdominal pain and rectal pain. Afebrile. Patient is on p.o. antibiotics.  EXAM: CT ABDOMEN AND PELVIS WITH CONTRAST  TECHNIQUE: Multidetector CT imaging of the abdomen and pelvis was performed using the standard protocol following bolus administration of intravenous contrast.  CONTRAST:  100 mL Omnipaque 300   COMPARISON:  11/20/2014  FINDINGS: The lung bases are clear.  The liver demonstrates no focal abnormality. There is no intrahepatic or extrahepatic biliary ductal dilatation. The gallbladder is normal. The spleen demonstrates no focal abnormality. The kidneys, adrenal glands and pancreas are normal. The bladder is unremarkable.  Lower sigmoid coal and persistent severe bowel wall thickening and surrounding inflammatory changes most consistent with acute diverticulitis. There is a 2.1 x 1.1 cm hypodensity in the right lateral wall of the lower sigmoid colon most concerning for an intramural abscess which is similar in appearance to the prior exam. There is no pneumoperitoneum, pneumatosis, or portal venous gas. There is no significant abdominal free fluid. There is no lymphadenopathy.  The abdominal aorta is normal in caliber .  There are no lytic or sclerotic osseous lesions. There is degenerative disc disease at L5-S1 with a broad-based disc osteophyte complex and bilateral facet arthropathy. There is bilateral facet arthropathy at L4-5. 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  IMPRESSION: 1. Persistent acute diverticulitis of the lower sigmoid colon with an approximately 2.1 x 1.1 cm intramural abscess in the right lateral wall of the lower sigmoid colon. The overall appearance is unchanged compared with 11/20/2014.   Electronically Signed   By: Elige KoHetal  Patel   On: 11/23/2014 19:00    Scheduled Meds: . amLODipine  10 mg Oral Daily  . atenolol  50 mg Oral Daily  . clorazepate  7.5 mg Oral BID  . enoxaparin (LOVENOX) injection  50 mg Subcutaneous Q24H  . fluconazole (DIFLUCAN) IV  200 mg Intravenous Q24H  . imipenem-cilastatin  500 mg Intravenous 4 times per day  . levothyroxine  125 mcg Oral QAC breakfast  . QUEtiapine  50 mg Oral QHS  . triamcinolone ointment  1 application Topical Daily   Continuous Infusions:     Marinda ElkFELIZ Hester, [name]  [facility] Pager [phone]. If 7PM-7AM,  please contact night-coverage at [email or web address], password [email or web address] 11/24/2014, 8:43 AM  LOS: 7 days

## 2014-11-25 DIAGNOSIS — B9689 Other specified bacterial agents as the cause of diseases classified elsewhere: Secondary | ICD-10-CM

## 2014-11-25 DIAGNOSIS — F17210 Nicotine dependence, cigarettes, uncomplicated: Secondary | ICD-10-CM

## 2014-11-25 DIAGNOSIS — Z590 Homelessness: Secondary | ICD-10-CM

## 2014-11-25 DIAGNOSIS — Z889 Allergy status to unspecified drugs, medicaments and biological substances status: Secondary | ICD-10-CM

## 2014-11-25 DIAGNOSIS — K651 Peritoneal abscess: Secondary | ICD-10-CM

## 2014-11-25 MED ORDER — SACCHAROMYCES BOULARDII 250 MG PO CAPS
250.0000 mg | ORAL_CAPSULE | Freq: Two times a day (BID) | ORAL | Status: DC
  Administered 2014-11-25 – 2014-11-26 (×3): 250 mg via ORAL
  Filled 2014-11-25 (×4): qty 1

## 2014-11-25 MED ORDER — SODIUM CHLORIDE 0.9 % IV SOLN
1.0000 g | INTRAVENOUS | Status: DC
  Administered 2014-11-25: 1 g via INTRAVENOUS
  Filled 2014-11-25: qty 1

## 2014-11-25 MED ORDER — BISMUTH SUBSALICYLATE 262 MG/15ML PO SUSP
30.0000 mL | Freq: Three times a day (TID) | ORAL | Status: DC | PRN
  Filled 2014-11-25: qty 118

## 2014-11-25 MED ORDER — AMOXICILLIN-POT CLAVULANATE 250-125 MG PO TABS
1.0000 | ORAL_TABLET | Freq: Three times a day (TID) | ORAL | Status: DC
  Administered 2014-11-25 – 2014-11-26 (×2): 1 via ORAL
  Filled 2014-11-25 (×5): qty 1

## 2014-11-25 NOTE — Progress Notes (Signed)
[facility] PROGRESS NOTE  [name] ZOX:[id number] DOB: [birth year] DOA: 11/17/2014 PCP: [facility]  Brief narrative 54 year old female with history of HTN, dysrhythmia, hypothyroidism, bipolar disorder, depression, anxiety who presented with abdominal pain secondary to distal sigmoid diverticulitis without abscess. In the ED patient had low-grade fever and mild leukocytosis. CT abd/pelvis demonstrated distal sigmoid diverticulitis and upper rectal proctitis with ill-defined fluid surrounding without well-defined abscess. + mesenteric thickening. Patient admitted with supportive care and empiric antibiotic but symptoms worsened and follow-up CT scan showed an interval development of 3 cm intramural abscess over the right lateral wall of the distal sigmoid colon in the area of acute diverticulitis. Hospital course prolonged due to persistent abdominal pain and not tolerating advanced diet. CT again repeated on 3/21 which shows unchanged sigmoid diverticulitis with small intramural abscess. IR consulted for drainage of abscess but reported that it was too small.    Assessment/Plan: Acute sigmoid diverticulitis with abscess Repeat CT on 3/21 unchanged. Too small to be drained. Tolerating clears. Surgery recommend on long term abx for now and  Surgery if not improved.  i have consulted ID for antibiotic recommendations. Continue primaxin for now. Continue when necessary oxycodone. When necessary Zofran for nausea. Will advance diet to full liquid.    Hypothyroidism Continue Synthroid   Bipolar disorder Continue Seroquel.  Essential hypertension Continue atenolol  Diet:  Full  liquid  DVT prophylaxis: Subcutaneous Lovenox  Code Status: full code  Family Communication: Husband at bedside  Disposition Plan: Continue inpatient monitoring for today   Consultants:  Surgery  ID  Procedures:  CT abdomen and pelvis ( 3/15. 3/18 and  3/21)  Antibiotics:  IV Aztreonam  and flagyl 3/15-3/21  IV primaxin 3/21--  HPI/Subjective: pt seen and examined. Still has rt LQ/ suprapubic abd pain. Tolerating clears. Had 2 BM yesterday  Objective: Filed Vitals:   11/25/14 0545  BP: 116/60  Pulse: 58  Temp: 98.3 F (36.8 C)  Resp: 18    Intake/Output Summary (Last 24 hours) at 11/25/14 0855 Last data filed at 11/24/14 1800  Gross per 24 hour  Intake    240 ml  Output      0 ml  Net    240 ml   Filed Weights   11/17/14 0839  Weight: 92.987 kg (205 lb)    Exam:   General: Middle aged female in no acute distress  HEENT: No pallor, moist mucosa,   Chest: Clear to auscultation bilaterally  CVS: Normal S1 and S2, no murmurs  GI: Soft, nondistended, bowel sounds present, tender to palpation over right lower quadrant and suprapubic area  Musculoskeletal: Warm, no edema   Data Reviewed: Basic Metabolic Panel:  Recent Labs Lab 11/24/14 0521  NA 139  K 3.8  CL 101  CO2 31  GLUCOSE 97  BUN 6  CREATININE 0.78  CALCIUM 8.9   Liver Function Tests: No results for input(s): AST, ALT, ALKPHOS, BILITOT, PROT, ALBUMIN in the last 168 hours. No results for input(s): LIPASE, AMYLASE in the last 168 hours. No results for input(s): AMMONIA in the last 168 hours. CBC:  Recent Labs Lab 11/23/14 1151 11/24/14 0521  WBC 4.9 5.0  NEUTROABS 3.0  --   HGB 12.0 12.3  HCT 38.0 39.2  MCV 90.3 89.7  PLT 285 313   Cardiac Enzymes: No results for input(s): CKTOTAL, CKMB, CKMBINDEX, TROPONINI in the last 168 hours. BNP (last 3 results) No results for input(s): BNP in the last 8760 hours.  ProBNP (last 3 results) No results for input(s): PROBNP in the last 8760 hours.  CBG: No results for input(s): GLUCAP in the last 168 hours.  Recent Results (from the past 240 hour(s))  Stool culture     Status: None   Collection Time: 11/18/14  2:37 AM  Result Value Ref Range Status   Specimen Description STOOL  Final    Special Requests Normal  Final   Culture   Final    NO SALMONELLA, SHIGELLA, CAMPYLOBACTER, YERSINIA, OR E.COLI 0157:H7 ISOLATED Performed at [facility]    Report Status 11/22/2014 FINAL  Final  Clostridium Difficile by PCR     Status: None   Collection Time: 11/18/14  2:37 AM  Result Value Ref Range Status   C difficile by pcr NEGATIVE NEGATIVE Final     Studies: Ct Abdomen Pelvis W Contrast  11/23/2014   CLINICAL DATA:  Is pole diverticulitis, moderate lower abdominal pain and rectal pain. Afebrile. Patient is on p.o. antibiotics.  EXAM: CT ABDOMEN AND PELVIS WITH CONTRAST  TECHNIQUE: Multidetector CT imaging of the abdomen and pelvis was performed using the standard protocol following bolus administration of intravenous contrast.  CONTRAST:  100 mL Omnipaque 300  COMPARISON:  11/20/2014  FINDINGS: The lung bases are clear.  The liver demonstrates no focal abnormality. There is no intrahepatic or extrahepatic biliary ductal dilatation. The gallbladder is normal. The spleen demonstrates no focal abnormality. The kidneys, adrenal glands and pancreas are normal. The bladder is unremarkable.  Lower sigmoid coal and persistent severe bowel wall thickening and surrounding inflammatory changes most consistent with acute diverticulitis. There is a 2.1 x 1.1 cm hypodensity in the right lateral wall of the lower sigmoid colon most concerning for an intramural abscess which is similar in appearance to the prior exam. There is no pneumoperitoneum, pneumatosis, or portal venous gas. There is no significant abdominal free fluid. There is no lymphadenopathy.  The abdominal aorta is normal in caliber .  There are no lytic or sclerotic osseous lesions. There is degenerative disc disease at L5-S1 with a broad-based disc osteophyte complex and bilateral facet arthropathy. There is bilateral facet arthropathy at L4-5. 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  IMPRESSION: 1. Persistent acute  diverticulitis of the lower sigmoid colon with an approximately 2.1 x 1.1 cm intramural abscess in the right lateral wall of the lower sigmoid colon. The overall appearance is unchanged compared with 11/20/2014.   Electronically Signed   By: Elige KoHetal  Patel   On: 11/23/2014 19:00    Scheduled Meds: . amLODipine  10 mg Oral Daily  . atenolol  50 mg Oral Daily  . clorazepate  7.5 mg Oral BID  . enoxaparin (LOVENOX) injection  50 mg Subcutaneous Q24H  . fluconazole (DIFLUCAN) IV  200 mg Intravenous Q24H  . imipenem-cilastatin  500 mg Intravenous 4 times per day  . levothyroxine  125 mcg Oral QAC breakfast  . QUEtiapine  50 mg Oral QHS  . triamcinolone ointment  1 application Topical Daily   Continuous Infusions:     Time spent: 20 minutes    Alper Guilmette  [facility] Pager [phone]. If 7PM-7AM, please contact night-coverage at [email or web address], password [email or web address] 11/25/2014, 8:55 AM  LOS: 8 days

## 2014-11-25 NOTE — Consult Note (Signed)
[facility]  Date of Admission:  11/17/2014  Date of Consult:  11/25/2014  Reason for Consult: Multiple Drug Allergies, intra-abdominal abscess Referring Physician: Dhungel  Impression/Recommendation Intra-abdominal abscess Multiple drug allergies  Would Check HIV, Hep C as per CDC rec's Change to Invanz Consider home with [facility] for at least weeks of anbx Repeat CT at that point Case management eval  Give her test dose of augmentin  Comment- I suggested the above course of action to the pt and her story became much more complicated- she is homeless and lives in [place] with her husband and son. I suggested that she could go to SNF for the IV anbx and she refused.  She needs CSW eval, housing above all.  Her PEN allergy was in 1997. Perhaps she could tolerate augmentin?  Thank you so much for this interesting consult,   Bobby Rumpf (pager) [phone] [email or web address]  [name] is an 54 y.o. female.  HPI: 54 yo F with hx of bipolar, adm on 3-15 with severe abd pain and chills. In ED she was afebrile, WBC was 12.9. She as found on CT to have sigmoid diverticulitis, proctitis as well as ill defined fluid and mesenteric thickening.  She was started on aztreonam/flagyl. Her WBC improved, she underwent repeat CT scan on 3-18 showing 2.8 x 2.8 x 2.8 cm fluid collection in area of sigmoid diverticulitis.  She was seen by surgery who felt she would improve with medical mgmt. She continued to improve and by 3-20 was transitioned to oral anbx (levaquin/flagyl). By 3-21 she had worsening abd pain and was changed to merrem. She had repeat CT scan on 3-21 which showed: Persistent acute diverticulitis of the lower sigmoid colon with an approximately 2.1 x 1.1 cm intramural abscess in the right lateral wall of the lower sigmoid colon. The overall appearance is unchanged compared with 11/20/2014. She was eval by IR and this was not felt to be  amenable to aspirate.   She has been afebrile in hospital.   Past Medical History  Diagnosis Date  . Complication of anesthesia     difficult to wake up and blood pressure drops  . Heart murmur   . Hypertension     sees Dr. Montez Morita, is patients primary  . Dysrhythmia     no medication treatment  . Chronic bronchitis   . Pneumonia     hx  . Hypothyroidism     takes synthroid  . Anemia   . Headache(784.0)   . Arthritis   . Bursitis     hx of  . Mental disorder     bipolar  . Anxiety   . Depression   . Eczema   . GERD (gastroesophageal reflux disease)     Past Surgical History  Procedure Laterality Date  . Cesarean section    . Tubal ligation    . Abdominal hysterectomy      has both of ovaries  . Colonoscopy    . Lumbar laminectomy  08/22/2011    Procedure: MICRODISCECTOMY LUMBAR LAMINECTOMY;  Surgeon: Peggyann Shoals, MD;  Location: [facility];  Service: Neurosurgery;  Laterality: Right;  RIGHT Lumbar Five-Sacral One microdiskectomy     Allergies  Allergen Reactions  . Benazepril Anaphylaxis, Hives and Itching  . Penicillins Hives    Has itching and some swelling in face  . Sulfa Antibiotics Hives    Itching and facial swelling  . Ciprofloxacin Hcl Hives    "funny  sensation in gums"  . Clindamycin/Lincomycin Hives    Itching, welps  . Erythromycin Hives    itching  . Other     Pt has problems with being awaken from anesthesia, Her blood pressure drops very low  . Zithromax [Azithromycin] Hives    itching    Medications:  Scheduled: . amLODipine  10 mg Oral Daily  . atenolol  50 mg Oral Daily  . clorazepate  7.5 mg Oral BID  . enoxaparin (LOVENOX) injection  50 mg Subcutaneous Q24H  . fluconazole (DIFLUCAN) IV  200 mg Intravenous Q24H  . imipenem-cilastatin  500 mg Intravenous 4 times per day  . levothyroxine  125 mcg Oral QAC breakfast  . QUEtiapine  50 mg Oral QHS  . saccharomyces boulardii  250 mg Oral BID  . triamcinolone ointment  1  application Topical Daily    Abtx:  Anti-infectives    Start     Dose/Rate Route Frequency Ordered Stop   11/23/14 1600  fluconazole (DIFLUCAN) IVPB 200 mg     200 mg 100 mL/hr over 60 Minutes Intravenous Every 24 hours 11/23/14 1525     11/23/14 1500  imipenem-cilastatin (PRIMAXIN) 500 mg in sodium chloride 0.9 % 100 mL IVPB     500 mg 200 mL/hr over 30 Minutes Intravenous 4 times per day 11/23/14 1320     11/22/14 2200  metroNIDAZOLE (FLAGYL) tablet 500 mg  Status:  Discontinued     500 mg Oral 3 times per day 11/22/14 1502 11/23/14 1231   11/22/14 1600  levofloxacin (LEVAQUIN) tablet 500 mg  Status:  Discontinued     500 mg Oral Daily 11/22/14 1502 11/23/14 1231   11/17/14 1400  aztreonam (AZACTAM) 2 g in dextrose 5 % 50 mL IVPB  Status:  Discontinued     2 g 100 mL/hr over 30 Minutes Intravenous 3 times per day 11/17/14 1320 11/22/14 1502   11/17/14 1315  metroNIDAZOLE (FLAGYL) IVPB 500 mg  Status:  Discontinued     500 mg 100 mL/hr over 60 Minutes Intravenous Every 6 hours 11/17/14 1305 11/22/14 1502   11/17/14 1300  metroNIDAZOLE (FLAGYL) IVPB 500 mg  Status:  Discontinued     500 mg 100 mL/hr over 60 Minutes Intravenous Every 6 hours 11/17/14 1246 11/17/14 1255      Total days of antibiotics: 8 (imipenem/flucon day 2)          Social History:  reports that she has been smoking Cigarettes.  She has a .25 pack-year smoking history. She has never used smokeless tobacco. She reports that she drinks alcohol. She reports that she does not use illicit drugs.  Family History  Problem Relation Age of Onset  . Anesthesia problems Mother   . Anesthesia problems Sister   . Cancer Mother     thyroid  . Cancer Maternal Aunt   . Cancer Maternal Aunt   . Colon cancer Sister     General ROS: no BM, normal urination, no f/c, taking liquids, pain better, see HPI.   Blood pressure 107/58, pulse 59, temperature 97.6 F (36.4 C), temperature source Oral, resp. rate 20, height '5\' 8"'   (1.727 m), weight 92.987 kg (205 lb), SpO2 97 %. General appearance: alert, cooperative and no distress Eyes: negative findings: conjunctivae and sclerae normal and pupils equal, round, reactive to light and accomodation Throat: normal findings: oropharynx pink & moist without lesions or evidence of thrush Neck: no adenopathy and supple, symmetrical, trachea midline Lungs: clear to auscultation bilaterally Heart:  regular rate and rhythm Abdomen: normal findings: bowel sounds normal and soft, non-tender Extremities: edema none   Results for orders placed or performed during the hospital encounter of 11/17/14 (from the past 48 hour(s))  Basic metabolic panel     Status: None   Collection Time: 11/24/14  5:21 AM  Result Value Ref Range   Sodium 139 135 - 145 mmol/L   Potassium 3.8 3.5 - 5.1 mmol/L   Chloride 101 96 - 112 mmol/L   CO2 31 19 - 32 mmol/L   Glucose, Bld 97 70 - 99 mg/dL   BUN 6 6 - 23 mg/dL   Creatinine, Ser 0.78 0.50 - 1.10 mg/dL   Calcium 8.9 8.4 - 10.5 mg/dL   GFR calc non Af Amer >90 >90 mL/min   GFR calc Af Amer >90 >90 mL/min    Comment: (NOTE) The eGFR has been calculated using the CKD EPI equation. This calculation has not been validated in all clinical situations. eGFR's persistently <90 mL/min signify possible Chronic Kidney Disease.    Anion gap 7 5 - 15  CBC     Status: None   Collection Time: 11/24/14  5:21 AM  Result Value Ref Range   WBC 5.0 4.0 - 10.5 K/uL   RBC 4.37 3.87 - 5.11 MIL/uL   Hemoglobin 12.3 12.0 - 15.0 g/dL   HCT 39.2 36.0 - 46.0 %   MCV 89.7 78.0 - 100.0 fL   MCH 28.1 26.0 - 34.0 pg   MCHC 31.4 30.0 - 36.0 g/dL   RDW 14.9 11.5 - 15.5 %   Platelets 313 150 - 400 K/uL  Protime-INR     Status: None   Collection Time: 11/24/14  5:21 AM  Result Value Ref Range   Prothrombin Time 14.4 11.6 - 15.2 seconds   INR 1.10 0.00 - 1.49    Comment: Performed at [facility] 11/18/2014 0237     [place] Normal 11/18/2014 0237   CULT  11/18/2014 0237    NO SALMONELLA, SHIGELLA, CAMPYLOBACTER, YERSINIA, OR E.COLI 0157:H7 ISOLATED Performed at [facility] 11/22/2014 FINAL 11/18/2014 0237   Ct Abdomen Pelvis W Contrast  11/23/2014   CLINICAL DATA:  Is pole diverticulitis, moderate lower abdominal pain and rectal pain. Afebrile. Patient is on p.o. antibiotics.  EXAM: CT ABDOMEN AND PELVIS WITH CONTRAST  TECHNIQUE: Multidetector CT imaging of the abdomen and pelvis was performed using the standard protocol following bolus administration of intravenous contrast.  CONTRAST:  100 mL Omnipaque 300  COMPARISON:  11/20/2014  FINDINGS: The lung bases are clear.  The liver demonstrates no focal abnormality. There is no intrahepatic or extrahepatic biliary ductal dilatation. The gallbladder is normal. The spleen demonstrates no focal abnormality. The kidneys, adrenal glands and pancreas are normal. The bladder is unremarkable.  Lower sigmoid coal and persistent severe bowel wall thickening and surrounding inflammatory changes most consistent with acute diverticulitis. There is a 2.1 x 1.1 cm hypodensity in the right lateral wall of the lower sigmoid colon most concerning for an intramural abscess which is similar in appearance to the prior exam. There is no pneumoperitoneum, pneumatosis, or portal venous gas. There is no significant abdominal free fluid. There is no lymphadenopathy.  The abdominal aorta is normal in caliber .  There are no lytic or sclerotic osseous lesions. There is degenerative disc disease at L5-S1 with a broad-based disc osteophyte complex and bilateral facet  arthropathy. There is bilateral facet arthropathy at L4-5. 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  IMPRESSION: 1. Persistent acute diverticulitis of the lower sigmoid colon with an approximately 2.1 x 1.1 cm intramural abscess in the right lateral wall of the lower sigmoid colon. The overall  appearance is unchanged compared with 11/20/2014.   Electronically Signed   By: Kathreen Devoid   On: 11/23/2014 19:00   Recent Results (from the past 240 hour(s))  Stool culture     Status: None   Collection Time: 11/18/14  2:37 AM  Result Value Ref Range Status   Specimen Description STOOL  Final   Special Requests Normal  Final   Culture   Final    NO SALMONELLA, SHIGELLA, CAMPYLOBACTER, YERSINIA, OR E.COLI 0157:H7 ISOLATED Performed at [facility]    Report Status 11/22/2014 FINAL  Final  Clostridium Difficile by PCR     Status: None   Collection Time: 11/18/14  2:37 AM  Result Value Ref Range Status   C difficile by pcr NEGATIVE NEGATIVE Final      11/25/2014, 4:26 PM     LOS: 8 days

## 2014-11-25 NOTE — Progress Notes (Signed)
Patient ID: [name], female   DOB: [birth year], 54 y.o.   MRN: [id number]     [facility]      [address] [place], [place] [address]    Phone: [phone] FAX: [phone]     Subjective: Just started fulls.  No n/v.  Having diarrhea. Normal wbc.  Afebrile.    Objective:  Vital signs:  Filed Vitals:   11/24/14 0513 11/24/14 1303 11/24/14 2130 11/25/14 0545  BP: 138/65 119/67 122/74 116/60  Pulse: 64 62 59 58  Temp: 98.1 F (36.7 C) 98.4 F (36.9 C) 98.1 F (36.7 C) 98.3 F (36.8 C)  TempSrc: Oral Oral Oral Oral  Resp: _0 Height:      Weight:      SpO2: 99% 98% 100% 99%    Last BM Date: 11/24/14  Intake/Output   Yesterday:  03/22 0701 - 03/23 0700 In: 240 [P.O.:240] Out: -  This shift:    I/O last 3 completed shifts: In: 240 [P.O.:240] Out: -     Physical Exam: General: Pt awake/alert/oriented x4 in no acute distress  Abdomen: Soft.  Nondistended.  Non tender.  No evidence of peritonitis.  No incarcerated hernias.   Problem List:   Principal Problem:   Diverticulitis of large intestine with abscess  Active Problems:   Diverticulitis   Leukocytosis   Essential hypertension   Acute diverticulitis    Results:   Labs: Results for orders placed or performed during the hospital encounter of 11/17/14 (from the past 48 hour(s))  CBC with Differential/Platelet     Status: None   Collection Time: 11/23/14 11:51 AM  Result Value Ref Range   WBC 4.9 4.0 - 10.5 K/uL   RBC 4.21 3.87 - 5.11 MIL/uL   Hemoglobin 12.0 12.0 - 15.0 g/dL   HCT 38.0 36.0 - 46.0 %   MCV 90.3 78.0 - 100.0 fL   MCH 28.5 26.0 - 34.0 pg   MCHC 31.6 30.0 - 36.0 g/dL   RDW 14.8 11.5 - 15.5 %   Platelets 285 150 - 400 K/uL   Neutrophils Relative % 62 43 - 77 %   Neutro Abs 3.0 1.7 - 7.7 K/uL   Lymphocytes Relative 29 12 - 46 %   Lymphs Abs 1.4 0.7 - 4.0 K/uL   Monocytes Relative 7 3 - 12 %   Monocytes Absolute 0.4  0.1 - 1.0 K/uL   Eosinophils Relative 1 0 - 5 %   Eosinophils Absolute 0.1 0.0 - 0.7 K/uL   Basophils Relative 1 0 - 1 %   Basophils Absolute 0.0 0.0 - 0.1 K/uL  Basic metabolic panel     Status: None   Collection Time: 11/24/14  5:21 AM  Result Value Ref Range   Sodium 139 135 - 145 mmol/L   Potassium 3.8 3.5 - 5.1 mmol/L   Chloride 101 96 - 112 mmol/L   CO2 31 19 - 32 mmol/L   Glucose, Bld 97 70 - 99 mg/dL   BUN 6 6 - 23 mg/dL   Creatinine, Ser 0.78 0.50 - 1.10 mg/dL   Calcium 8.9 8.4 - 10.5 mg/dL   GFR calc non Af Amer >90 >90 mL/min   GFR calc Af Amer >90 >90 mL/min    Comment: (NOTE) The eGFR has been calculated using the CKD EPI equation. This calculation has not been validated in all clinical situations. eGFR's persistently <90 mL/min signify possible Chronic Kidney Disease.  Anion gap 7 5 - 15  CBC     Status: None   Collection Time: 11/24/14  5:21 AM  Result Value Ref Range   WBC 5.0 4.0 - 10.5 K/uL   RBC 4.37 3.87 - 5.11 MIL/uL   Hemoglobin 12.3 12.0 - 15.0 g/dL   HCT 39.2 36.0 - 46.0 %   MCV 89.7 78.0 - 100.0 fL   MCH 28.1 26.0 - 34.0 pg   MCHC 31.4 30.0 - 36.0 g/dL   RDW 14.9 11.5 - 15.5 %   Platelets 313 150 - 400 K/uL  Protime-INR     Status: None   Collection Time: 11/24/14  5:21 AM  Result Value Ref Range   Prothrombin Time 14.4 11.6 - 15.2 seconds   INR 1.10 0.00 - 1.49    Comment: Performed at [facility]    Imaging / Studies: Ct Abdomen Pelvis W Contrast  11/23/2014   CLINICAL DATA:  Is pole diverticulitis, moderate lower abdominal pain and rectal pain. Afebrile. Patient is on p.o. antibiotics.  EXAM: CT ABDOMEN AND PELVIS WITH CONTRAST  TECHNIQUE: Multidetector CT imaging of the abdomen and pelvis was performed using the standard protocol following bolus administration of intravenous contrast.  CONTRAST:  100 mL Omnipaque 300  COMPARISON:  11/20/2014  FINDINGS: The lung bases are clear.  The liver demonstrates no focal abnormality. There is  no intrahepatic or extrahepatic biliary ductal dilatation. The gallbladder is normal. The spleen demonstrates no focal abnormality. The kidneys, adrenal glands and pancreas are normal. The bladder is unremarkable.  Lower sigmoid coal and persistent severe bowel wall thickening and surrounding inflammatory changes most consistent with acute diverticulitis. There is a 2.1 x 1.1 cm hypodensity in the right lateral wall of the lower sigmoid colon most concerning for an intramural abscess which is similar in appearance to the prior exam. There is no pneumoperitoneum, pneumatosis, or portal venous gas. There is no significant abdominal free fluid. There is no lymphadenopathy.  The abdominal aorta is normal in caliber .  There are no lytic or sclerotic osseous lesions. There is degenerative disc disease at L5-S1 with a broad-based disc osteophyte complex and bilateral facet arthropathy. There is bilateral facet arthropathy at L4-5. 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  IMPRESSION: 1. Persistent acute diverticulitis of the lower sigmoid colon with an approximately 2.1 x 1.1 cm intramural abscess in the right lateral wall of the lower sigmoid colon. The overall appearance is unchanged compared with 11/20/2014.   Electronically Signed   By: Kathreen Devoid   On: 11/23/2014 19:00    Medications / Allergies:  Scheduled Meds: . amLODipine  10 mg Oral Daily  . atenolol  50 mg Oral Daily  . clorazepate  7.5 mg Oral BID  . enoxaparin (LOVENOX) injection  50 mg Subcutaneous Q24H  . fluconazole (DIFLUCAN) IV  200 mg Intravenous Q24H  . imipenem-cilastatin  500 mg Intravenous 4 times per day  . levothyroxine  125 mcg Oral QAC breakfast  . QUEtiapine  50 mg Oral QHS  . triamcinolone ointment  1 application Topical Daily   Continuous Infusions:  PRN Meds:.albuterol, naphazoline, ondansetron **OR** ondansetron (ZOFRAN) IV, oxyCODONE  Antibiotics: Anti-infectives    Start     Dose/Rate Route Frequency Ordered  Stop   11/23/14 1600  fluconazole (DIFLUCAN) IVPB 200 mg     200 mg 100 mL/hr over 60 Minutes Intravenous Every 24 hours 11/23/14 1525     11/23/14 1500  imipenem-cilastatin (PRIMAXIN) 500 mg in sodium  chloride 0.9 % 100 mL IVPB     500 mg 200 mL/hr over 30 Minutes Intravenous 4 times per day 11/23/14 1320     11/22/14 2200  metroNIDAZOLE (FLAGYL) tablet 500 mg  Status:  Discontinued     500 mg Oral 3 times per day 11/22/14 1502 11/23/14 1231   11/22/14 1600  levofloxacin (LEVAQUIN) tablet 500 mg  Status:  Discontinued     500 mg Oral Daily 11/22/14 1502 11/23/14 1231   11/17/14 1400  aztreonam (AZACTAM) 2 g in dextrose 5 % 50 mL IVPB  Status:  Discontinued     2 g 100 mL/hr over 30 Minutes Intravenous 3 times per day 11/17/14 1320 11/22/14 1502   11/17/14 1315  metroNIDAZOLE (FLAGYL) IVPB 500 mg  Status:  Discontinued     500 mg 100 mL/hr over 60 Minutes Intravenous Every 6 hours 11/17/14 1305 11/22/14 1502   11/17/14 1300  metroNIDAZOLE (FLAGYL) IVPB 500 mg  Status:  Discontinued     500 mg 100 mL/hr over 60 Minutes Intravenous Every 6 hours 11/17/14 1246 11/17/14 1255        Assessment/Plan HD#8 Acute diverticulitis with small intramural abscess 2.8 x 2.8 x 2.8cm, repeat CT 3/21 shows: 2.1 x 1.1 Cm fluid collection, not amenable to drainage. -will see how she does with fulls and advance if tolerating -appreciate medicine calling ID for assistance with antibiotics. -if she fails medical management, diagnostic laparoscopy with possible washout and drains versus colectomy/colostomy/Hartmann's   Erby Pian, ANP-BC [facility] Pager [phone](7A-[id number])   11/25/2014 10:28 AM

## 2014-11-26 DIAGNOSIS — E038 Other specified hypothyroidism: Secondary | ICD-10-CM

## 2014-11-26 LAB — HIV ANTIBODY (ROUTINE TESTING W REFLEX): HIV Screen 4th Generation wRfx: NONREACTIVE

## 2014-11-26 LAB — HEPATITIS C ANTIBODY: HCV Ab: NEGATIVE

## 2014-11-26 MED ORDER — AMOXICILLIN-POT CLAVULANATE 875-125 MG PO TABS
1.0000 | ORAL_TABLET | Freq: Two times a day (BID) | ORAL | Status: DC
  Administered 2014-11-26: 1 via ORAL
  Filled 2014-11-26 (×2): qty 1

## 2014-11-26 MED ORDER — SACCHAROMYCES BOULARDII 250 MG PO CAPS: 250.0000 mg | ORAL_CAPSULE | Freq: Two times a day (BID) | ORAL | Status: DC

## 2014-11-26 MED ORDER — AMOXICILLIN-POT CLAVULANATE 875-125 MG PO TABS: 1.0000 | ORAL_TABLET | Freq: Two times a day (BID) | ORAL | Status: AC

## 2014-11-26 MED ORDER — OXYCODONE HCL 10 MG PO TABS
10.0000 mg | ORAL_TABLET | Freq: Four times a day (QID) | ORAL | Status: DC | PRN
Start: 2014-11-26 — End: 2015-08-15

## 2014-11-26 NOTE — Progress Notes (Signed)
[facility] and will return to shelter upon discharge. She states that she uses [facility] pharmacy, they deliver and waive her Medicaid fee for any medications. CSW will assist with transportion needs. Sue LushAndrea CM [phone]

## 2014-11-26 NOTE — Progress Notes (Signed)
Patient ID: [name], female   DOB: 06/17/1961, 54 y.o.   MRN: [id number]     [facility]      [address] [place]   [place], [place] [address]    Phone: [phone] FAX: [phone]     Subjective: Tolerating solids.  VSS.  Afebrile.   Objective:  Vital signs:  Filed Vitals:   11/25/14 0545 11/25/14 1345 11/25/14 2116 11/26/14 0521  BP: 116/60 107/58 138/75 113/64  Pulse: 58 59 56 55  Temp: 98.3 F (36.8 C) 97.6 F (36.4 C) 97.5 F (36.4 C) 98 F (36.7 C)  TempSrc: Oral Oral Oral Oral  Resp: Height:      Weight:      SpO2: 99% 97% 98% 97%    Last BM Date: 11/24/14  Intake/Output   Yesterday:  03/23 0701 - 03/24 0700 In: 240 [P.O.:240] Out: -  This shift:  Total I/O In: 240 [P.O.:240] Out: -   Physical Exam: General: Pt awake/alert/oriented x4 in no acute distress  Abdomen: Soft. Nondistended. Non tender. No evidence of peritonitis. No incarcerated hernias.  Problem List:   Principal Problem:   Diverticulitis of large intestine with abscess  Active Problems:   Leukocytosis   Hypothyroidism   Depression   Essential hypertension   Acute diverticulitis    Results:   Labs: Results for orders placed or performed during the hospital encounter of 11/17/14 (from the past 48 hour(s))  HIV antibody     Status: None   Collection Time: 11/25/14  5:50 PM  Result Value Ref Range   HIV Screen 4th Generation wRfx Non Reactive Non Reactive    Comment: (NOTE) Performed At: [facility] [address] [place], [place] [id number] Mila Homer MD MW:[id number]   Hepatitis C antibody     Status: None   Collection Time: 11/25/14  5:50 PM  Result Value Ref Range   HCV Ab NEGATIVE NEGATIVE    Comment: Performed at [facility]    Imaging / Studies: No results found.  Medications / Allergies:  Scheduled Meds: . amLODipine  10 mg Oral Daily  . amoxicillin-clavulanate  1  tablet Oral Q12H  . atenolol  50 mg Oral Daily  . clorazepate  7.5 mg Oral BID  . enoxaparin (LOVENOX) injection  50 mg Subcutaneous Q24H  . fluconazole (DIFLUCAN) IV  200 mg Intravenous Q24H  . levothyroxine  125 mcg Oral QAC breakfast  . QUEtiapine  50 mg Oral QHS  . saccharomyces boulardii  250 mg Oral BID  . triamcinolone ointment  1 application Topical Daily   Continuous Infusions:  PRN Meds:.albuterol, bismuth subsalicylate, naphazoline, ondansetron **OR** ondansetron (ZOFRAN) IV, oxyCODONE  Antibiotics: Anti-infectives    Start     Dose/Rate Route Frequency Ordered Stop   11/26/14 1200  amoxicillin-clavulanate (AUGMENTIN) 875-125 MG per tablet 1 tablet     1 tablet Oral Every 12 hours 11/26/14 0859 12/17/14 0959   11/26/14 0000  amoxicillin-clavulanate (AUGMENTIN) 875-125 MG per tablet     1 tablet Oral Every 12 hours 11/26/14 1053 12/17/14 2359   11/25/14 1800  amoxicillin-clavulanate (AUGMENTIN) 250-125 MG per tablet 1 tablet  Status:  Discontinued     1 tablet Oral 3 times per day 11/25/14 1659 11/26/14 0907   11/25/14 1800  ertapenem (INVANZ) 1 g in sodium chloride 0.9 % 50 mL IVPB  Status:  Discontinued     1 g 100 mL/hr over 30 Minutes Intravenous  Every 24 hours 11/25/14 1659 11/26/14 0859   11/23/14 1600  fluconazole (DIFLUCAN) IVPB 200 mg     200 mg 100 mL/hr over 60 Minutes Intravenous Every 24 hours 11/23/14 1525     11/23/14 1500  imipenem-cilastatin (PRIMAXIN) 500 mg in sodium chloride 0.9 % 100 mL IVPB  Status:  Discontinued     500 mg 200 mL/hr over 30 Minutes Intravenous 4 times per day 11/23/14 1320 11/25/14 1659   11/22/14 2200  metroNIDAZOLE (FLAGYL) tablet 500 mg  Status:  Discontinued     500 mg Oral 3 times per day 11/22/14 1502 11/23/14 1231   11/22/14 1600  levofloxacin (LEVAQUIN) tablet 500 mg  Status:  Discontinued     500 mg Oral Daily 11/22/14 1502 11/23/14 1231   11/17/14 1400  aztreonam (AZACTAM) 2 g in dextrose 5 % 50 mL IVPB  Status:   Discontinued     2 g 100 mL/hr over 30 Minutes Intravenous 3 times per day 11/17/14 1320 11/22/14 1502   11/17/14 1315  metroNIDAZOLE (FLAGYL) IVPB 500 mg  Status:  Discontinued     500 mg 100 mL/hr over 60 Minutes Intravenous Every 6 hours 11/17/14 1305 11/22/14 1502   11/17/14 1300  metroNIDAZOLE (FLAGYL) IVPB 500 mg  Status:  Discontinued     500 mg 100 mL/hr over 60 Minutes Intravenous Every 6 hours 11/17/14 1246 11/17/14 1255        Assessment/Plan HD#9 Acute diverticulitis with small intramural abscess 2.8 x 2.8 x 2.8cm, repeat CT 3/21 shows: 2.1 x 1.1 Cm fluid collection, not amenable to drainage. -tolerating PO, minimal pain, afebrile. -appreciate ID recs -stable for  DC from surgical standpoint -follow up with Dr. Thereasa ParkinHoxworth    Ramie Palladino, [facility] LLCNP-BC [facility] Pager [phone]([id number])   11/26/2014 1:14 PM

## 2014-11-26 NOTE — Discharge Summary (Signed)
Physician Discharge Summary  [name] VHQ:[id number] DOB: 04-20-1961 DOA: 11/17/2014  PCP: [facility]  Admit date: 11/17/2014 Discharge date: 11/26/2014  Time spent: 35  minutes  Recommendations for Outpatient Follow-up:  1. Discharge back to shelter with outpt follow up with surgery in 2-3 weeks ( office will arrange appointment) 2. patient will be discharge on oral Augmentin for 3 weeks course. ( stop date 12/10/2014)  Discharge Diagnoses:  Principal Problem:   Acute Diverticulitis of large intestine with abscess    Active Problems:   Leukocytosis   Hypothyroidism   Depression   Essential hypertension    Discharge Condition: fair  Diet recommendation: full liquid   Filed Weights   11/17/14 0839  Weight: 92.987 kg (205 lb)    History of present illness:  54 year old female with history of HTN, dysrhythmia, hypothyroidism, bipolar disorder, depression, anxiety who presented with abdominal pain secondary to distal sigmoid diverticulitis without abscess. In the ED patient had low-grade fever and mild leukocytosis. CT abd/pelvis demonstrated distal sigmoid diverticulitis and upper rectal proctitis with ill-defined fluid surrounding without well-defined abscess with  mesenteric thickening. Patient admitted with supportive care and empiric antibiotic but symptoms worsened and follow-up CT scan showed an interval development of 3 cm intramural abscess over the right lateral wall of the distal sigmoid colon in the area of acute diverticulitis. Hospital course prolonged due to persistent abdominal pain and not tolerating advanced diet. CT again repeated on 3/21 which shows unchanged sigmoid diverticulitis with small intramural abscess. IR consulted for drainage of abscess but reported that it was too small.   Hospital Course:  Acute sigmoid diverticulitis with abscess -Admission CT abdomen showing diverticulitis with subsequent CT scans showing development  of intramural abscess. - IR consulted for drainage but was too small to be drained. Patient has been tolerating full liquid and I will discharge her on this. Patient would need long-term antibiotics until symptoms subside with plan on outpatient surgery follow-up with a repeat abdominal CT at that point. If medical management fails she would need diagnostic laparoscopy with possible washout and drains versus colectomy/colostomy. -ID consulted and appreciate recommendations. Given multiple drug allergies, initial plan was placement of PICC line with long-term IV antibiotics. However given her home situation this was not possible as patient lives in a shelter. Skilled nursing facility was offered but patient declined. -Given her multiple antibiotic allergies including penicillin (reported as having a rash over 20 years back), she was given a test dose of Augmentin which she has tolerated well (has received 2 doses already). -As per ID recommendations patient will be discharged on oral Augmentin for a 21 day course. -I have prescribed her a when necessary course of oxycodone.    Hypothyroidism Continue Synthroid   Bipolar disorder Continue Seroquel.  Essential hypertension Continue home medications  Social issues Patient lives at [facility] ( shelter) with her teenage son. She has Medicaid and is looking for affordable housing. She has to stay out of the shelter from 9 in the morning to 3:30 in the afternoon. I offer to write a letter for her to be low to stay at the shelter during the day but I was informed that the shelter does not permit anybody to stay back during the day. Patient reports that she will follow up at the [facility] urgent care. I have provided her information of [facility] if she wants to establish care for long-term.   Diet: Full liquid   Code Status:  full code  Family Communication: Husband at bedside  Disposition Plan: Discharge to shelter with  outpatient PCP and surgery follow-up   Consultants:  Surgery  ID (Dr. Ninetta LightsHatcher )  Procedures:  CT abdomen and pelvis ( 3/15., 3/18 and 3/21)  Antibiotics:  IV Aztreonam and flagyl 3/15-3/21  IV primaxin 3/21--3/23  IV Invanz (3/23-3/24)  Augmentin since 3/23 until 12/10/2014  Discharge Exam: Filed Vitals:   11/26/14 0521  BP: 113/64  Pulse: 55  Temp: 98 F (36.7 C)  Resp: 16     General: Middle aged female in no acute distress  HEENT: No pallor, moist mucosa, supple neck  Chest: Clear to auscultation bilaterally  CVS: Normal S1 and S2, no murmurs  GI: Soft, nondistended, bowel sounds present, mild tender to palpation over right lower quadrant and suprapubic area  Musculoskeletal: Warm, no edema  CNS: alert and oriented  Discharge Instructions    Current Discharge Medication List    START taking these medications   Details  amoxicillin-clavulanate (AUGMENTIN) 875-125 MG per tablet Take 1 tablet by mouth every 12 (twelve) hours. Qty: 42 tablet, Refills: 0    oxyCODONE 10 MG TABS Take 1 tablet (10 mg total) by mouth every 6 (six) hours as needed for severe pain. Qty: 40 tablet, Refills: 0    saccharomyces boulardii (FLORASTOR) 250 MG capsule Take 1 capsule (250 mg total) by mouth 2 (two) times daily. Qty: 60 capsule, Refills: 0      CONTINUE these medications which have NOT CHANGED   Details  albuterol (PROVENTIL HFA;VENTOLIN HFA) 108 (90 BASE) MCG/ACT inhaler Inhale 2 puffs into the lungs every 4 (four) hours as needed for wheezing or shortness of breath. Qty: 1 Inhaler, Refills: 0    amLODipine (NORVASC) 10 MG tablet Take 1 tablet (10 mg total) by mouth daily. Take this instead of your LOTREL until you see your regular doctor. Qty: 15 tablet, Refills: 0    atenolol (TENORMIN) 50 MG tablet Take 50 mg by mouth daily.    clorazepate (TRANXENE) 7.5 MG tablet Take 7.5 mg by mouth 2 (two) times daily.      docusate sodium (COLACE) 100 MG capsule  Take 100 mg by mouth daily.     levothyroxine (SYNTHROID, LEVOTHROID) 125 MCG tablet Take 125 mcg by mouth daily.      polycarbophil (FIBERCON) 625 MG tablet Take 625 mg by mouth daily.      QUEtiapine (SEROQUEL XR) 50 MG TB24 Take 50 mg by mouth at bedtime.     triamcinolone ointment (KENALOG) 0.1 % Apply 1 application topically daily. All over.    VITAMIN D, CHOLECALCIFEROL, PO Take 1 tablet by mouth daily.    diphenhydrAMINE (BENADRYL) 25 MG tablet Take 1 tablet (25 mg total) by mouth every 6 (six) hours. Qty: 20 tablet, Refills: 0       Allergies  Allergen Reactions  . Benazepril Anaphylaxis, Hives and Itching  . Penicillins Hives    Has itching and some swelling in face  . Sulfa Antibiotics Hives    Itching and facial swelling  . Ciprofloxacin Hcl Hives    "funny sensation in gums"  . Clindamycin/Lincomycin Hives    Itching, welps  . Erythromycin Hives    itching  . Other     Pt has problems with being awaken from anesthesia, Her blood pressure drops very low  . Zithromax [Azithromycin] Hives    itching   Follow-up Information    Follow up with [facility] On  11/30/2014.   Why:  Follow up appointment with Dr. Mauro Kaufmann on March 28th at 9:30am   Contact information:   [address] [place] [address] [phone]       Follow up with Mariella Saa, MD In 2 weeks.   Specialty:  General Surgery   Contact information:   [address] [place] [address] [phone]       Follow up with [facility]    . Schedule an appointment as soon as possible for a visit in 1 week.   Why:  if patient wishes to establish as a new pt   Contact information:   [address] [facility] [address] [phone]       The results of significant diagnostics from this hospitalization (including imaging, microbiology, ancillary and laboratory) are listed below for reference.     Significant Diagnostic Studies: Ct Abdomen Pelvis W Contrast  11/23/2014   CLINICAL DATA:  Is pole diverticulitis, moderate lower abdominal pain and rectal pain. Afebrile. Patient is on p.o. antibiotics.  EXAM: CT ABDOMEN AND PELVIS WITH CONTRAST  TECHNIQUE: Multidetector CT imaging of the abdomen and pelvis was performed using the standard protocol following bolus administration of intravenous contrast.  CONTRAST:  100 mL Omnipaque 300  COMPARISON:  11/20/2014  FINDINGS: The lung bases are clear.  The liver demonstrates no focal abnormality. There is no intrahepatic or extrahepatic biliary ductal dilatation. The gallbladder is normal. The spleen demonstrates no focal abnormality. The kidneys, adrenal glands and pancreas are normal. The bladder is unremarkable.  Lower sigmoid coal and persistent severe bowel wall thickening and surrounding inflammatory changes most consistent with acute diverticulitis. There is a 2.1 x 1.1 cm hypodensity in the right lateral wall of the lower sigmoid colon most concerning for an intramural abscess which is similar in appearance to the prior exam. There is no pneumoperitoneum, pneumatosis, or portal venous gas. There is no significant abdominal free fluid. There is no lymphadenopathy.  The abdominal aorta is normal in caliber .  There are no lytic or sclerotic osseous lesions. There is degenerative disc disease at L5-S1 with a broad-based disc osteophyte complex and bilateral facet arthropathy. There is bilateral facet arthropathy at L4-5. 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  IMPRESSION: 1. Persistent acute diverticulitis of the lower sigmoid colon with an approximately 2.1 x 1.1 cm intramural abscess in the right lateral wall of the lower sigmoid colon. The overall appearance is unchanged compared with 11/20/2014.   Electronically Signed   By: Elige Ko   On: 11/23/2014 19:00   Ct Abdomen Pelvis W Contrast  11/20/2014   CLINICAL DATA:  Followup sigmoid  diverticulitis. Acute bilateral lower quadrant abdominal pain radiating to the back. Leukocytosis. Nausea. Surgical history includes hysterectomy.  EXAM: CT ABDOMEN AND PELVIS WITH CONTRAST  TECHNIQUE: Multidetector CT imaging of the abdomen and pelvis was performed using the standard protocol following bolus administration of intravenous contrast.  CONTRAST:  OMNIPAQUE IOHEXOL 300 MG/ML IV. Oral contrast was also administered.  COMPARISON:  11/17/2014.  FINDINGS: Since the examination 3 days ago, interval development of an approximate 2.8 x 2.8 x 2.8 cm multiloculated fluid collection which likely involves the wall of the distal sigmoid colon in the area of diverticulitis. Marked thickening of the wall of the distal sigmoid colon persists, and there is extensive edema/inflammation in the surrounding fat. No evidence of abscess elsewhere.  Diverticulosis involving the descending and sigmoid colon.  Remainder of the colon unremarkable. No evidence of colonic obstruction despite the luminal narrowing in the distal sigmoid colon. Small bowel normal in appearance. Stomach decompressed and unremarkable.  Normal early portal venous phase appearance of the liver, spleen, pancreas, adrenal glands and kidneys. Gallbladder unremarkable by CT. No biliary ductal dilation. No visible aortoiliofemoral atherosclerosis. Widely patent visceral arteries. No significant lymphadenopathy.  Uterus surgically absent. No adnexal masses. Urinary bladder unremarkable.  Bone window images demonstrate degenerative disc disease and spondylosis at L5-S 1, facet degenerative changes throughout the lumbar spine, and mild degenerative changes in the sacroiliac joints. Visualized lung bases clear. Heart size upper normal to perhaps slightly enlarged.  IMPRESSION: 1. Interval development of an approximate 3 cm intramural abscess involving the right lateral wall of the distal sigmoid colon in the area of acute diverticulitis. 2. Persistent marked  thickening of the wall of the distal sigmoid colon with associated luminal narrowing, though there is no evidence of colonic obstruction. 3. No new abnormalities otherwise since the CT 3 days ago.   Electronically Signed   By: Hulan Saas M.D.   On: 11/20/2014 11:40   Ct Abdomen Pelvis W Contrast  11/17/2014   CLINICAL DATA:  Abdominal and rectal pain for 1 day.  EXAM: CT ABDOMEN AND PELVIS WITH CONTRAST  TECHNIQUE: Multidetector CT imaging of the abdomen and pelvis was performed using the standard protocol following bolus administration of intravenous contrast. Oral contrast was also administered.  CONTRAST:  OMNIPAQUE IOHEXOL 300 MG/ML  SOLN  COMPARISON:  None.  FINDINGS: Lung bases are clear.  Liver is prominent, measuring 17.1 cm in length. No focal liver lesions are identified. The gallbladder wall is not appreciably thickened. There is no biliary duct dilatation.  Spleen, pancreas, and adrenals appear normal. There is a 7 mm cyst in the posterior mid right kidney. There is no hydronephrosis on either side. There is no renal or ureteral calculus on either side.  In the pelvis, the urinary bladder is midline with normal wall thickness. There is inflammatory change surrounding the distal sigmoid colon extending to the upper rectum. There is ill-defined fluid and mesenteric thickening in this area. There is no well-defined abscess in this area. No appreciable perforation is identified in this area. There are multiple sigmoid diverticula in this area. Uterus is absent. There is no periappendiceal region inflammation. Appendix not seen.  There is no bowel obstruction. No free air or portal venous air. There is lipomatous infiltration of the ileocecal valve.  There is no ascites, adenopathy, or abscess in the abdomen or pelvis. There is no abdominal aortic aneurysm. There is degenerative change in the lumbar spine. There are no blastic or lytic bone lesions. There is degenerative change at L5-S1.   IMPRESSION: Evidence of distal sigmoid diverticulitis and upper rectal proctitis. There is ill-defined fluid surrounding these structures without well-defined abscess currently. No perforations seen. There is surrounding mesenteric thickening.  Elsewhere, there is no bowel obstruction or abscess. No periappendiceal region inflammation.  Liver prominent without focal lesion. No renal or ureteral calculus. No hydronephrosis.   Electronically Signed   By: Bretta Bang III M.D.   On: 11/17/2014 11:17    Microbiology: Recent Results (from the past 240 hour(s))  Stool culture     Status: None   Collection Time: 11/18/14  2:37 AM  Result Value Ref Range Status   Specimen Description STOOL  Final   Special Requests Normal  Final   Culture   Final    NO  SALMONELLA, SHIGELLA, CAMPYLOBACTER, YERSINIA, OR E.COLI 0157:H7 ISOLATED Performed at [facility]    Report Status 11/22/2014 FINAL  Final  Clostridium Difficile by PCR     Status: None   Collection Time: 11/18/14  2:37 AM  Result Value Ref Range Status   C difficile by pcr NEGATIVE NEGATIVE Final     Labs: Basic Metabolic Panel:  Recent Labs Lab 11/24/14 0521  NA 139  K 3.8  CL 101  CO2 31  GLUCOSE 97  BUN 6  CREATININE 0.78  CALCIUM 8.9   Liver Function Tests: No results for input(s): AST, ALT, ALKPHOS, BILITOT, PROT, ALBUMIN in the last 168 hours. No results for input(s): LIPASE, AMYLASE in the last 168 hours. No results for input(s): AMMONIA in the last 168 hours. CBC:  Recent Labs Lab 11/23/14 1151 11/24/14 0521  WBC 4.9 5.0  NEUTROABS 3.0  --   HGB 12.0 12.3  HCT 38.0 39.2  MCV 90.3 89.7  PLT 285 313   Cardiac Enzymes: No results for input(s): CKTOTAL, CKMB, CKMBINDEX, TROPONINI in the last 168 hours. BNP: BNP (last 3 results) No results for input(s): BNP in the last 8760 hours.  ProBNP (last 3 results) No results for input(s): PROBNP in the last 8760 hours.  CBG: No results for input(s):  GLUCAP in the last 168 hours.     SignedEddie North  [facility] 11/26/2014, 10:55 AM

## 2014-11-26 NOTE — Plan of Care (Signed)
Problem: Food- and Nutrition-Related Knowledge Deficit (NB-1.1) Goal: Nutrition education Formal process to instruct or train a patient/client in a skill or to impart knowledge to help patients/clients voluntarily manage or modify food choices and eating behavior to maintain or improve health. Outcome: Completed/Met Date Met:  11/26/14 Nutrition Education Note  RD consulted for nutrition education regarding nutrition management for diverticulosis/ Diverticulitis.    RD provided "Fiber Restricted Nutrition Therapy" handout as well as "5 Sample Menus for Gradually Increasing Fiber" handout from the Academy of Nutrition and Dietetics. Reviewed patient's dietary recall and discussed ways for pt to meet nutrition goals over the next several weeks. Explained reasons for pt to follow a low fiber diet over the next 4-6 weeks. Reviewed low fiber foods and high fiber foods. Discussed best practice for long term management of diverticulosis is a high fiber diet and discussed ways to gradually increased fiber in the diet.   Teach back method used. Pt verbalizes understanding of information provided.   Expect good compliance.  Body mass index is Body mass index is 31.18 kg/(m^2).Marland Kitchen Pt meets criteria for obesity based on current BMI.  Current diet order is full liquid, patient is consuming approximately 100% of meals at this time. Labs and medications reviewed. No further nutrition interventions warranted at this time. RD contact information provided. If additional nutrition issues arise, please re-consult RD.  Laurette Schimke [place], [place], [place]

## 2014-11-26 NOTE — Progress Notes (Signed)
INFECTIOUS DISEASE PROGRESS NOTE  ID: [name] is a 54 y.o. female with  Principal Problem:   Diverticulitis of large intestine with abscess  Active Problems:   Diverticulitis   Leukocytosis   Essential hypertension   Acute diverticulitis  Subjective: States [name] had SOB with taking augmentin.  [name] denies rash, hives, current SOB.   Abtx:  Anti-infectives    Start     Dose/Rate Route Frequency Ordered Stop   11/25/14 1800  amoxicillin-clavulanate (AUGMENTIN) 250-125 MG per tablet 1 tablet     1 tablet Oral 3 times per day 11/25/14 1659     11/25/14 1800  ertapenem (INVANZ) 1 g in sodium chloride 0.9 % 50 mL IVPB     1 g 100 mL/hr over 30 Minutes Intravenous Every 24 hours 11/25/14 1659     11/23/14 1600  fluconazole (DIFLUCAN) IVPB 200 mg     200 mg 100 mL/hr over 60 Minutes Intravenous Every 24 hours 11/23/14 1525     11/23/14 1500  imipenem-cilastatin (PRIMAXIN) 500 mg in sodium chloride 0.9 % 100 mL IVPB  Status:  Discontinued     500 mg 200 mL/hr over 30 Minutes Intravenous 4 times per day 11/23/14 1320 11/25/14 1659   11/22/14 2200  metroNIDAZOLE (FLAGYL) tablet 500 mg  Status:  Discontinued     500 mg Oral 3 times per day 11/22/14 1502 11/23/14 1231   11/22/14 1600  levofloxacin (LEVAQUIN) tablet 500 mg  Status:  Discontinued     500 mg Oral Daily 11/22/14 1502 11/23/14 1231   11/17/14 1400  aztreonam (AZACTAM) 2 g in dextrose 5 % 50 mL IVPB  Status:  Discontinued     2 g 100 mL/hr over 30 Minutes Intravenous 3 times per day 11/17/14 1320 11/22/14 1502   11/17/14 1315  metroNIDAZOLE (FLAGYL) IVPB 500 mg  Status:  Discontinued     500 mg 100 mL/hr over 60 Minutes Intravenous Every 6 hours 11/17/14 1305 11/22/14 1502   11/17/14 1300  metroNIDAZOLE (FLAGYL) IVPB 500 mg  Status:  Discontinued     500 mg 100 mL/hr over 60 Minutes Intravenous Every 6 hours 11/17/14 1246 11/17/14 1255      Medications:  Scheduled: . amLODipine  10 mg Oral Daily  .  amoxicillin-clavulanate  1 tablet Oral 3 times per day  . atenolol  50 mg Oral Daily  . clorazepate  7.5 mg Oral BID  . enoxaparin (LOVENOX) injection  50 mg Subcutaneous Q24H  . ertapenem  1 g Intravenous Q24H  . fluconazole (DIFLUCAN) IV  200 mg Intravenous Q24H  . levothyroxine  125 mcg Oral QAC breakfast  . QUEtiapine  50 mg Oral QHS  . saccharomyces boulardii  250 mg Oral BID  . triamcinolone ointment  1 application Topical Daily    Objective: Vital signs in last 24 hours: Temp:  [97.5 F (36.4 C)-98 F (36.7 C)] 98 F (36.7 C) (03/24 0521) Pulse Rate:  [55-59] 55 (03/24 0521) Resp:  [16-20] 16 (03/24 0521) BP: (107-138)/(58-75) 113/64 mmHg (03/24 0521) SpO2:  [97 %-98 %] 97 % (03/24 0521)   General appearance: no exam, pt in shower.   Lab Results  Recent Labs  11/23/14 1151 11/24/14 0521  WBC 4.9 5.0  HGB 12.0 12.3  HCT 38.0 39.2  NA  --  139  K  --  3.8  CL  --  101  CO2  --  31  BUN  --  6  CREATININE  --  0.78   Liver Panel No results for input(s): PROT, ALBUMIN, AST, ALT, ALKPHOS, BILITOT, BILIDIR, IBILI in the last 72 hours. Sedimentation Rate No results for input(s): ESRSEDRATE in the last 72 hours. C-Reactive Protein No results for input(s): CRP in the last 72 hours.  Microbiology: Recent Results (from the past 240 hour(s))  Stool culture     Status: None   Collection Time: 11/18/14  2:37 AM  Result Value Ref Range Status   Specimen Description STOOL  Final   Special Requests Normal  Final   Culture   Final    NO SALMONELLA, SHIGELLA, CAMPYLOBACTER, YERSINIA, OR E.COLI 0157:H7 ISOLATED [facility]formed at [facility]    Report Status 11/22/2014 FINAL  Final  Clostridium Difficile by PCR     Status: None   Collection Time: 11/18/14  2:37 AM  Result Value Ref Range Status   C difficile by pcr NEGATIVE NEGATIVE Final    Studies/Results: No results found.   Assessment/Plan: Intra-abdominal abscess diverticulitis Multiple drug  allergies  Total days of antibiotics; 9 (imipenem/flucon)  Would change her to augmentin- plan for 21 days F/u with surgery, pcp for repeat CT scan at end of therapy, possible surgical planning? Observe Work on housing         [employer] CorporationJeffrey Hatcher Infectious Diseases (pager) [phone] [email or web address] 11/26/2014, 8:52 AM  LOS: 9 days

## 2014-11-26 NOTE — Discharge Instructions (Signed)
Diverticulitis °Diverticulitis is when small pockets that have formed in your colon (large intestine) become infected or swollen. °HOME CARE °· Follow your doctor's instructions. °· Follow a special diet if told by your doctor. °· When you feel better, your doctor may tell you to change your diet. You may be told to eat a lot of fiber. Fruits and vegetables are good sources of fiber. Fiber makes it easier to poop (have bowel movements). °· Take supplements or probiotics as told by your doctor. °· Only take medicines as told by your doctor. °· Keep all follow-up visits with your doctor. °GET HELP IF: °· Your pain does not get better. °· You have a hard time eating food. °· You are not pooping like normal. °GET HELP RIGHT AWAY IF: °· Your pain gets worse. °· Your problems do not get better. °· Your problems suddenly get worse. °· You have a fever. °· You keep throwing up (vomiting). °· You have bloody or black, tarry poop (stool). °MAKE SURE YOU:  °· Understand these instructions. °· Will watch your condition. °· Will get help right away if you are not doing well or get worse. °Document Released: 02/07/2008 Document Revised: 08/26/2013 Document Reviewed: 07/16/2013 °Exit[employer]® Patient Information ©2015 [employer]. This information is not intended to replace advice given to you by your health care provider. Make sure you discuss any questions you have with your health care provider. ° °

## 2014-12-09 ENCOUNTER — Ambulatory Visit: Payer: Medicaid Other | Attending: Internal Medicine | Admitting: Internal Medicine

## 2014-12-09 ENCOUNTER — Encounter: Payer: Self-pay | Admitting: Internal Medicine

## 2014-12-09 VITALS — BP 140/70 | HR 65 | Temp 98.0°F | Resp 16 | Wt 202.8 lb

## 2014-12-09 DIAGNOSIS — R21 Rash and other nonspecific skin eruption: Secondary | ICD-10-CM | POA: Diagnosis not present

## 2014-12-09 DIAGNOSIS — F79 Unspecified intellectual disabilities: Secondary | ICD-10-CM | POA: Insufficient documentation

## 2014-12-09 DIAGNOSIS — F99 Mental disorder, not otherwise specified: Secondary | ICD-10-CM

## 2014-12-09 DIAGNOSIS — F17200 Nicotine dependence, unspecified, uncomplicated: Secondary | ICD-10-CM

## 2014-12-09 DIAGNOSIS — Z720 Tobacco use: Secondary | ICD-10-CM | POA: Diagnosis not present

## 2014-12-09 DIAGNOSIS — K5792 Diverticulitis of intestine, part unspecified, without perforation or abscess without bleeding: Secondary | ICD-10-CM | POA: Insufficient documentation

## 2014-12-09 DIAGNOSIS — I10 Essential (primary) hypertension: Secondary | ICD-10-CM

## 2014-12-09 DIAGNOSIS — E039 Hypothyroidism, unspecified: Secondary | ICD-10-CM | POA: Insufficient documentation

## 2014-12-09 DIAGNOSIS — E038 Other specified hypothyroidism: Secondary | ICD-10-CM

## 2014-12-09 DIAGNOSIS — Z1231 Encounter for screening mammogram for malignant neoplasm of breast: Secondary | ICD-10-CM

## 2014-12-09 LAB — COMPLETE METABOLIC PANEL WITH GFR
ALT: 20 U/L (ref 0–35)
AST: 20 U/L (ref 0–37)
Albumin: 4.3 g/dL (ref 3.5–5.2)
Alkaline Phosphatase: 88 U/L (ref 39–117)
BUN: 9 mg/dL (ref 6–23)
CO2: 30 mEq/L (ref 19–32)
Calcium: 9.9 mg/dL (ref 8.4–10.5)
Chloride: 101 mEq/L (ref 96–112)
Creat: 0.8 mg/dL (ref 0.50–1.10)
GFR, Est African American: >89 mL/min
GFR, Est Non African American: 83 mL/min
Glucose, Bld: 96 mg/dL (ref 70–99)
Potassium: 5.7 mEq/L — ABNORMAL HIGH (ref 3.5–5.3)
Sodium: 142 mEq/L (ref 135–145)
Total Bilirubin: 0.4 mg/dL (ref 0.2–1.2)
Total Protein: 7.2 g/dL (ref 6.0–8.3)

## 2014-12-09 LAB — TSH: TSH: 1.609 u[IU]/mL (ref 0.350–4.500)

## 2014-12-09 MED ORDER — HYDROCORTISONE 1 % EX CREA: 1.0000 "application " | TOPICAL_CREAM | Freq: Two times a day (BID) | CUTANEOUS | Status: DC

## 2014-12-09 NOTE — Progress Notes (Signed)
Patient Demographics  [name], is a 54 y.o. female  BJY:[id number]  YQM:[id number]  DOB - 1961-02-15  CC:  Chief Complaint  Patient presents with  . Hospitalization Follow-up       HPI: [name] is a 54 y.o. female here today to establish medical care.Patient has history of hypertension hypothyroidism, bipolar disorder, anxiety/depression, recently hospitalized with abdominal pain which was secondary to distal sigmoid diverticulitis without abscess, EMR reviewed patient was treated with  empiric antibiotics, subsequently another CT scan showed she development of 3 cm intramural abscess, interventional radiology was consulted but it was too small to be drained patient symptomatically improved and was discharged on oral antibiotics Augmentin to continue for 21 days, patient was advised to follow with general surgery. Currently patient is taking her antibiotic denies any nausea vomiting fever chills, she has noticed small rash on her left forearm which is itchy, today her blood pressure is borderline elevated, repeat manual blood pressure is 140/70 as per patient she is taking her medication, I have counseled patient to quit smoking. Patient is also following up with psychiatrist and already has scheduled appointment with surgery.  Patient has No headache, No chest pain, No abdominal pain - No Nausea, No new weakness tingling or numbness, No Cough - SOB.  Allergies  Allergen Reactions  . Benazepril Anaphylaxis, Hives and Itching  . Penicillins Hives    Has itching and some swelling in face  . Sulfa Antibiotics Hives    Itching and facial swelling  . Ciprofloxacin Hcl Hives    "funny sensation in gums"  . Clindamycin/Lincomycin Hives    Itching, welps  . Erythromycin Hives    itching  . Other     Pt has problems with being awaken from anesthesia, Her blood pressure drops very low  . Zithromax [Azithromycin] Hives    itching   Past Medical History  Diagnosis Date  .  Complication of anesthesia     difficult to wake up and blood pressure drops  . Heart murmur   . Hypertension     sees Dr. Shana Chute, is patients primary  . Dysrhythmia     no medication treatment  . Chronic bronchitis   . Pneumonia     hx  . Hypothyroidism     takes synthroid  . Anemia   . Headache(784.0)   . Arthritis   . Bursitis     hx of  . Mental disorder     bipolar  . Anxiety   . Depression   . Eczema   . GERD (gastroesophageal reflux disease)   . Diverticula of intestine    Current Outpatient Prescriptions on File Prior to Visit  Medication Sig Dispense Refill  . albuterol (PROVENTIL HFA;VENTOLIN HFA) 108 (90 BASE) MCG/ACT inhaler Inhale 2 puffs into the lungs every 4 (four) hours as needed for wheezing or shortness of breath. 1 Inhaler 0  . amLODipine (NORVASC) 10 MG tablet Take 1 tablet (10 mg total) by mouth daily. Take this instead of your LOTREL until you see your regular doctor. 15 tablet 0  . amoxicillin-clavulanate (AUGMENTIN) 875-125 MG per tablet Take 1 tablet by mouth every 12 (twelve) hours. 42 tablet 0  . atenolol (TENORMIN) 50 MG tablet Take 50 mg by mouth daily.    . clorazepate (TRANXENE) 7.5 MG tablet Take 7.5 mg by mouth 2 (two) times daily.      . diphenhydrAMINE (BENADRYL) 25 MG tablet Take 1 tablet (25 mg total) by mouth every 6 (  six) hours. (Patient not taking: Reported on 08/11/2014) 20 tablet 0  . docusate sodium (COLACE) 100 MG capsule Take 100 mg by mouth daily.     Marland Kitchen levothyroxine (SYNTHROID, LEVOTHROID) 125 MCG tablet Take 125 mcg by mouth daily.      Marland Kitchen oxyCODONE 10 MG TABS Take 1 tablet (10 mg total) by mouth every 6 (six) hours as needed for severe pain. 40 tablet 0  . polycarbophil (FIBERCON) 625 MG tablet Take 625 mg by mouth daily.      . QUEtiapine (SEROQUEL XR) 50 MG TB24 Take 50 mg by mouth at bedtime.     . saccharomyces boulardii (FLORASTOR) 250 MG capsule Take 1 capsule (250 mg total) by mouth 2 (two) times daily. 60 capsule 0  .  triamcinolone ointment (KENALOG) 0.1 % Apply 1 application topically daily. All over.    Marland Kitchen VITAMIN D, CHOLECALCIFEROL, PO Take 1 tablet by mouth daily.    . [DISCONTINUED] amLODipine-benazepril (LOTREL) 5-20 MG per capsule Take 1 capsule by mouth daily.     No current facility-administered medications on file prior to visit.   Family History  Problem Relation Age of Onset  . Anesthesia problems Mother   . Cancer Mother     thyroid  . Hypertension Mother   . Anesthesia problems Sister   . Hypertension Sister   . Cancer Maternal Aunt   . Cancer Maternal Aunt   . Colon cancer Sister   . Stroke Sister   . Hypertension Brother    History   Social History  . Marital Status: Single    Spouse Name: N/A  . Number of Children: N/A  . Years of Education: N/A   Occupational History  . Not on file.   Social History Main Topics  . Smoking status: Current Every Day Smoker -- 0.25 packs/day for 20 years    Types: Cigarettes  . Smokeless tobacco: Never Used  . Alcohol Use: No     Comment: occ  . Drug Use: No  . Sexual Activity: No   Other Topics Concern  . Not on file   Social History Narrative    Review of Systems: Constitutional: Negative for fever, chills, diaphoresis, activity change, appetite change and fatigue. HENT: Negative for ear pain, nosebleeds, congestion, facial swelling, rhinorrhea, neck pain, neck stiffness and ear discharge.  Eyes: Negative for pain, discharge, redness, itching and visual disturbance. Respiratory: Negative for cough, choking, chest tightness, shortness of breath, wheezing and stridor.  Cardiovascular: Negative for chest pain, palpitations and leg swelling. Gastrointestinal: Negative for abdominal distention. Genitourinary: Negative for dysuria, urgency, frequency, hematuria, flank pain, decreased urine volume, difficulty urinating and dyspareunia.  Musculoskeletal: Negative for back pain, joint swelling, arthralgia and gait  problem. Neurological: Negative for dizziness, tremors, seizures, syncope, facial asymmetry, speech difficulty, weakness, light-headedness, numbness and headaches.  Hematological: Negative for adenopathy. Does not bruise/bleed easily. Psychiatric/Behavioral: Negative for hallucinations, behavioral problems, confusion, dysphoric mood, decreased concentration and agitation.    Objective:   Filed Vitals:   12/09/14 1516  BP: 140/70  Pulse:   Temp:   Resp:     Physical Exam: Constitutional: Patient appears well-developed and well-nourished. No distress. HENT: Normocephalic, atraumatic, External right and left ear normal. Oropharynx is clear and moist.  Eyes: Conjunctivae and EOM are normal. PERRLA, no scleral icterus. Neck: Normal ROM. Neck supple. No JVD. No tracheal deviation. No thyromegaly. CVS: RRR, S1/S2 +, no murmurs, no gallops, no carotid bruit.  Pulmonary: Effort and breath sounds normal, no stridor, rhonchi,  wheezes, rales.  Abdominal: Soft. BS +, no distension, tenderness, rebound or guarding.  Musculoskeletal: Normal range of motion. No edema and no tenderness.  Neuro: Alert. Normal reflexes, muscle tone coordination. No cranial nerve deficit. Skin: Skin on the left forearm shows slightly raised erythematous rashno apparent discharge or tenderness Psychiatric: Normal mood and affect. Behavior, judgment, thought content normal.  Lab Results  Component Value Date   WBC 5.0 11/24/2014   HGB 12.3 11/24/2014   HCT 39.2 11/24/2014   MCV 89.7 11/24/2014   PLT 313 11/24/2014   Lab Results  Component Value Date   CREATININE 0.78 11/24/2014   BUN 6 11/24/2014   NA 139 11/24/2014   K 3.8 11/24/2014   CL 101 11/24/2014   CO2 31 11/24/2014    No results found for: HGBA1C Lipid Panel  No results found for: CHOL, TRIG, HDL, CHOLHDL, VLDL, LDLCALC     Assessment and plan:   1. Other specified hypothyroidism Patient is currently on levothyroxine 125 mcg daily, will  check TSH level - TSH  2. Mental disorder Her symptoms are stable, currently being followed by her psychiatrist  3. Essential hypertension Blood pressure is borderline elevated, currently patient is on amlodipine as well as atenolol, I have counseled patient for DASH diet. Evaluate on the next visit  - COMPLETE METABOLIC PANEL WITH GFR  4. Acute diverticulitis Currently patient is on antibiotic Augmentin, tolerating by mouth diet, scheduled to follow up with surgery patient also reported to she had a colonoscopy done 3-5 years ago , she'll schedule appointment with her gastroenterologist as well.  5. Rash and nonspecific skin eruption  - hydrocortisone cream 1 %; Apply 1 application topically 2 (two) times daily.  Dispense: 30 g; Refill: 1  6. Smoking Counseled patient to quit smoking.  7. Encounter for screening mammogram for breast cancer  - MM DIGITAL SCREENING BILATERAL; Future        Health Maintenance -Colonoscopy:patient to schedule appointment with GI  -Mammogram:ordered   Return in about 3 months (around 03/10/2015) for hypertension, hypothyroid.    The patient was given clear instructions to go to ER or return to medical center if symptoms don't improve, worsen or new problems develop. The patient verbalized understanding. The patient was told to call to get lab results if they haven't heard anything in the next week.    This note has been created with Education officer, environmentalDragon speech recognition software and smart phrase technology. Any transcriptional errors are unintentional.   Doris CheadleADVANI, Dartagnan Beavers, MD

## 2014-12-09 NOTE — Patient Instructions (Addendum)
DASH Eating Plan DASH stands for "Dietary Approaches to Stop Hypertension." The DASH eating plan is a healthy eating plan that has been shown to reduce high blood pressure (hypertension). Additional health benefits may include reducing the risk of type 2 diabetes mellitus, heart disease, and stroke. The DASH eating plan may also help with weight loss. WHAT DO I NEED TO KNOW ABOUT THE DASH EATING PLAN? For the DASH eating plan, you will follow these general guidelines:  Choose foods with a percent daily value for sodium of less than 5% (as listed on the food label).  Use salt-free seasonings or herbs instead of table salt or sea salt.  Check with your health care provider or pharmacist before using salt substitutes.  Eat lower-sodium products, often labeled as "lower sodium" or "no salt added."  Eat fresh foods.  Eat more vegetables, fruits, and low-fat dairy products.  Choose whole grains. Look for the word "whole" as the first word in the ingredient list.  Choose fish and skinless chicken or turkey more often than red meat. Limit fish, poultry, and meat to 6 oz (170 g) each day.  Limit sweets, desserts, sugars, and sugary drinks.  Choose heart-healthy fats.  Limit cheese to 1 oz (28 g) per day.  Eat more home-cooked food and less restaurant, buffet, and fast food.  Limit fried foods.  Cook foods using methods other than frying.  Limit canned vegetables. If you do use them, rinse them well to decrease the sodium.  When eating at a restaurant, ask that your food be prepared with less salt, or no salt if possible. WHAT FOODS CAN I EAT? Seek help from a dietitian for individual calorie needs. Grains Whole grain or whole wheat bread. Brown rice. Whole grain or whole wheat pasta. Quinoa, bulgur, and whole grain cereals. Low-sodium cereals. Corn or whole wheat flour tortillas. Whole grain cornbread. Whole grain crackers. Low-sodium crackers. Vegetables Fresh or frozen vegetables  (raw, steamed, roasted, or grilled). Low-sodium or reduced-sodium tomato and vegetable juices. Low-sodium or reduced-sodium tomato sauce and paste. Low-sodium or reduced-sodium canned vegetables.  Fruits All fresh, canned (in natural juice), or frozen fruits. Meat and Other Protein Products Ground beef (85% or leaner), grass-fed beef, or beef trimmed of fat. Skinless chicken or turkey. Ground chicken or turkey. Pork trimmed of fat. All fish and seafood. Eggs. Dried beans, peas, or lentils. Unsalted nuts and seeds. Unsalted canned beans. Dairy Low-fat dairy products, such as skim or 1% milk, 2% or reduced-fat cheeses, low-fat ricotta or cottage cheese, or plain low-fat yogurt. Low-sodium or reduced-sodium cheeses. Fats and Oils Tub margarines without trans fats. Light or reduced-fat mayonnaise and salad dressings (reduced sodium). Avocado. Safflower, olive, or canola oils. Natural peanut or almond butter. Other Unsalted popcorn and pretzels. The items listed above may not be a complete list of recommended foods or beverages. Contact your dietitian for more options. WHAT FOODS ARE NOT RECOMMENDED? Grains White bread. White pasta. White rice. Refined cornbread. Bagels and croissants. Crackers that contain trans fat. Vegetables Creamed or fried vegetables. Vegetables in a cheese sauce. Regular canned vegetables. Regular canned tomato sauce and paste. Regular tomato and vegetable juices. Fruits Dried fruits. Canned fruit in light or heavy syrup. Fruit juice. Meat and Other Protein Products Fatty cuts of meat. Ribs, chicken wings, bacon, sausage, bologna, salami, chitterlings, fatback, hot dogs, bratwurst, and packaged luncheon meats. Salted nuts and seeds. Canned beans with salt. Dairy Whole or 2% milk, cream, half-and-half, and cream cheese. Whole-fat or sweetened yogurt. Full-fat   cheeses or blue cheese. Nondairy creamers and whipped toppings. Processed cheese, cheese spreads, or cheese  curds. Condiments Onion and garlic salt, seasoned salt, table salt, and sea salt. Canned and packaged gravies. Worcestershire sauce. Tartar sauce. Barbecue sauce. Teriyaki sauce. Soy sauce, including reduced sodium. Steak sauce. Fish sauce. Oyster sauce. Cocktail sauce. Horseradish. Ketchup and mustard. Meat flavorings and tenderizers. Bouillon cubes. Hot sauce. Tabasco sauce. Marinades. Taco seasonings. Relishes. Fats and Oils Butter, stick margarine, lard, shortening, ghee, and bacon fat. Coconut, palm kernel, or palm oils. Regular salad dressings. Other Pickles and olives. Salted popcorn and pretzels. The items listed above may not be a complete list of foods and beverages to avoid. Contact your dietitian for more information. WHERE CAN I FIND MORE INFORMATION? National Heart, Lung, and Blood Institute: www.nhlbi.nih.gov/health/health-topics/topics/dash/ Document Released: 08/10/2011 Document Revised: 01/05/2014 Document Reviewed: 06/25/2013 ExitCare Patient Information 2015 [employer]. This information is not intended to replace advice given to you by your health care provider. Make sure you discuss any questions you have with your health care provider. Smoking Cessation Quitting smoking is important to your health and has many advantages. However, it is not always easy to quit since nicotine is a very addictive drug. Oftentimes, people try 3 times or more before being able to quit. This document explains the best ways for you to prepare to quit smoking. Quitting takes hard work and a lot of effort, but you can do it. ADVANTAGES OF QUITTING SMOKING  You will live longer, feel better, and live better.  Your body will feel the impact of quitting smoking almost immediately.  Within 20 minutes, blood pressure decreases. Your pulse returns to its normal level.  After 8 hours, carbon monoxide levels in the blood return to normal. Your oxygen level increases.  After 24 hours, the chance of  having a heart attack starts to decrease. Your breath, hair, and body stop smelling like smoke.  After 48 hours, damaged nerve endings begin to recover. Your sense of taste and smell improve.  After 72 hours, the body is virtually free of nicotine. Your bronchial tubes relax and breathing becomes easier.  After 2 to 12 weeks, lungs can hold more air. Exercise becomes easier and circulation improves.  The risk of having a heart attack, stroke, cancer, or lung disease is greatly reduced.  After 1 year, the risk of coronary heart disease is cut in half.  After 5 years, the risk of stroke falls to the same as a nonsmoker.  After 10 years, the risk of lung cancer is cut in half and the risk of other cancers decreases significantly.  After 15 years, the risk of coronary heart disease drops, usually to the level of a nonsmoker.  If you are pregnant, quitting smoking will improve your chances of having a healthy baby.  The people you live with, especially any children, will be healthier.  You will have extra money to spend on things other than cigarettes. QUESTIONS TO THINK ABOUT BEFORE ATTEMPTING TO QUIT You may want to talk about your answers with your health care provider.  Why do you want to quit?  If you tried to quit in the past, what helped and what did not?  What will be the most difficult situations for you after you quit? How will you plan to handle them?  Who can help you through the tough times? Your family? Friends? A health care provider?  What pleasures do you get from smoking? What ways can you still get pleasure   if you quit? Here are some questions to ask your health care provider:  How can you help me to be successful at quitting?  What medicine do you think would be best for me and how should I take it?  What should I do if I need more help?  What is smoking withdrawal like? How can I get information on withdrawal? GET READY  Set a quit date.  Change your  environment by getting rid of all cigarettes, ashtrays, matches, and lighters in your home, car, or work. Do not let people smoke in your home.  Review your past attempts to quit. Think about what worked and what did not. GET SUPPORT AND ENCOURAGEMENT You have a better chance of being successful if you have help. You can get support in many ways.  Tell your family, friends, and coworkers that you are going to quit and need their support. Ask them not to smoke around you.  Get individual, group, or telephone counseling and support. Programs are available at local hospitals and health centers. Call your local health department for information about programs in your area.  Spiritual beliefs and practices may help some smokers quit.  Download a "quit meter" on your computer to keep track of quit statistics, such as how long you have gone without smoking, cigarettes not smoked, and money saved.  Get a self-help book about quitting smoking and staying off tobacco. LEARN NEW SKILLS AND BEHAVIORS  Distract yourself from urges to smoke. Talk to someone, go for a walk, or occupy your time with a task.  Change your normal routine. Take a different route to work. Drink tea instead of coffee. Eat breakfast in a different place.  Reduce your stress. Take a hot bath, exercise, or read a book.  Plan something enjoyable to do every day. Reward yourself for not smoking.  Explore interactive web-based programs that specialize in helping you quit. GET MEDICINE AND USE IT CORRECTLY Medicines can help you stop smoking and decrease the urge to smoke. Combining medicine with the above behavioral methods and support can greatly increase your chances of successfully quitting smoking.  Nicotine replacement therapy helps deliver nicotine to your body without the negative effects and risks of smoking. Nicotine replacement therapy includes nicotine gum, lozenges, inhalers, nasal sprays, and skin patches. Some may be  available over-the-counter and others require a prescription.  Antidepressant medicine helps people abstain from smoking, but how this works is unknown. This medicine is available by prescription.  Nicotinic receptor partial agonist medicine simulates the effect of nicotine in your brain. This medicine is available by prescription. Ask your health care provider for advice about which medicines to use and how to use them based on your health history. Your health care provider will tell you what side effects to look out for if you choose to be on a medicine or therapy. Carefully read the information on the package. Do not use any other product containing nicotine while using a nicotine replacement product.  RELAPSE OR DIFFICULT SITUATIONS Most relapses occur within the first 3 months after quitting. Do not be discouraged if you start smoking again. Remember, most people try several times before finally quitting. You may have symptoms of withdrawal because your body is used to nicotine. You may crave cigarettes, be irritable, feel very hungry, cough often, get headaches, or have difficulty concentrating. The withdrawal symptoms are only temporary. They are strongest when you first quit, but they will go away within 10-14 days. To reduce the   chances of relapse, try to:  Avoid drinking alcohol. Drinking lowers your chances of successfully quitting.  Reduce the amount of caffeine you consume. Once you quit smoking, the amount of caffeine in your body increases and can give you symptoms, such as a rapid heartbeat, sweating, and anxiety.  Avoid smokers because they can make you want to smoke.  Do not let weight gain distract you. Many smokers will gain weight when they quit, usually less than 10 pounds. Eat a healthy diet and stay active. You can always lose the weight gained after you quit.  Find ways to improve your mood other than smoking. FOR MORE INFORMATION  www.smokefree.gov  Document Released:  08/15/2001 Document Revised: 01/05/2014 Document Reviewed: 11/30/2011 ExitCare Patient Information 2015 [employer]. This information is not intended to replace advice given to you by your health care provider. Make sure you discuss any questions you have with your health care provider.  

## 2014-12-09 NOTE — Progress Notes (Signed)
Patient here for follow up from the hospital Was admitted with diverticulitis Patient has a long list of medications she is allergic to so  Very hard to treat with antibiotics Patient does take medications for hypertension and thyroid disease Complains of some type of "bite" to her left arm-area red raised and warm to the touch

## 2014-12-11 ENCOUNTER — Telehealth: Payer: Self-pay

## 2014-12-11 NOTE — Telephone Encounter (Signed)
-----   Message from Doris Cheadleeepak Advani, MD sent at 12/10/2014  9:30 AM EDT ----- Call and let the patient know that her thyroid test is in normal range, continue with current dose of levothyroxine 125 mcg daily. Also noticed borderline elevated potassium, advise patient to avoid high potassium containing foods.

## 2014-12-11 NOTE — Telephone Encounter (Signed)
Patient is aware of her lab results 

## 2014-12-17 ENCOUNTER — Other Ambulatory Visit: Payer: Self-pay

## 2014-12-17 ENCOUNTER — Other Ambulatory Visit: Payer: Self-pay | Admitting: General Surgery

## 2014-12-17 DIAGNOSIS — K5720 Diverticulitis of large intestine with perforation and abscess without bleeding: Secondary | ICD-10-CM

## 2014-12-17 NOTE — Addendum Note (Signed)
Addended by: Glenna FellowsHOXWORTH, Dorcas Melito T on: 12/17/2014 09:25 AM   Modules accepted: Orders

## 2014-12-24 ENCOUNTER — Ambulatory Visit
Admission: RE | Admit: 2014-12-24 | Discharge: 2014-12-24 | Disposition: A | Discharge status: Home / Self Care | Payer: Medicaid Other | Source: Ambulatory Visit | Attending: General Surgery | Admitting: General Surgery

## 2014-12-24 MED ORDER — IOPAMIDOL (ISOVUE-300) INJECTION 61%
100.0000 mL | Freq: Once | INTRAVENOUS | Status: AC | PRN
  Administered 2014-12-24: 100 mL via INTRAVENOUS

## 2015-01-27 ENCOUNTER — Ambulatory Visit: Payer: Medicaid Other

## 2015-01-29 ENCOUNTER — Telehealth: Payer: Self-pay | Admitting: Internal Medicine

## 2015-01-29 ENCOUNTER — Encounter: Payer: Self-pay | Admitting: Internal Medicine

## 2015-01-29 ENCOUNTER — Ambulatory Visit: Payer: Medicaid Other | Attending: Internal Medicine | Admitting: Internal Medicine

## 2015-01-29 VITALS — BP 129/78 | HR 67 | Temp 98.9°F | Resp 16 | Ht 68.0 in | Wt 197.0 lb

## 2015-01-29 DIAGNOSIS — R591 Generalized enlarged lymph nodes: Secondary | ICD-10-CM | POA: Insufficient documentation

## 2015-01-29 DIAGNOSIS — M62830 Muscle spasm of back: Secondary | ICD-10-CM

## 2015-01-29 DIAGNOSIS — E875 Hyperkalemia: Secondary | ICD-10-CM | POA: Diagnosis not present

## 2015-01-29 DIAGNOSIS — R590 Localized enlarged lymph nodes: Secondary | ICD-10-CM

## 2015-01-29 LAB — CBC WITH DIFFERENTIAL/PLATELET
Basophils Absolute: 0 10*3/uL (ref 0.0–0.1)
Basophils Relative: 0 % (ref 0–1)
Eosinophils Absolute: 0.1 10*3/uL (ref 0.0–0.7)
Eosinophils Relative: 2 % (ref 0–5)
HCT: 44.2 % (ref 36.0–46.0)
Hemoglobin: 14.2 g/dL (ref 12.0–15.0)
Lymphocytes Relative: 51 % — ABNORMAL HIGH (ref 12–46)
Lymphs Abs: 2.4 10*3/uL (ref 0.7–4.0)
MCH: 27.6 pg (ref 26.0–34.0)
MCHC: 32.1 g/dL (ref 30.0–36.0)
MCV: 86 fL (ref 78.0–100.0)
MPV: 9.9 fL (ref 8.6–12.4)
Monocytes Absolute: 0.4 10*3/uL (ref 0.1–1.0)
Monocytes Relative: 8 % (ref 3–12)
Neutro Abs: 1.9 10*3/uL (ref 1.7–7.7)
Neutrophils Relative %: 39 % — ABNORMAL LOW (ref 43–77)
Platelets: 277 10*3/uL (ref 150–400)
RBC: 5.14 MIL/uL — ABNORMAL HIGH (ref 3.87–5.11)
RDW: 14.5 % (ref 11.5–15.5)
WBC: 4.8 10*3/uL (ref 4.0–10.5)

## 2015-01-29 LAB — COMPLETE METABOLIC PANEL WITH GFR
ALT: 11 U/L (ref 0–35)
AST: 13 U/L (ref 0–37)
Albumin: 3.9 g/dL (ref 3.5–5.2)
Alkaline Phosphatase: 105 U/L (ref 39–117)
BUN: 11 mg/dL (ref 6–23)
CO2: 30 mEq/L (ref 19–32)
Calcium: 9.4 mg/dL (ref 8.4–10.5)
Chloride: 103 mEq/L (ref 96–112)
Creat: 0.72 mg/dL (ref 0.50–1.10)
GFR, Est African American: >89 mL/min
GFR, Est Non African American: >89 mL/min
Glucose, Bld: 91 mg/dL (ref 70–99)
Potassium: 4 mEq/L (ref 3.5–5.3)
Sodium: 139 mEq/L (ref 135–145)
Total Bilirubin: 0.6 mg/dL (ref 0.2–1.2)
Total Protein: 6.7 g/dL (ref 6.0–8.3)

## 2015-01-29 MED ORDER — CYCLOBENZAPRINE HCL 10 MG PO TABS: 10.0000 mg | ORAL_TABLET | Freq: Every day | ORAL | Status: DC

## 2015-01-29 NOTE — Progress Notes (Signed)
Stated has a facial knot lt side under lt ear  Moves around, painful

## 2015-01-29 NOTE — Telephone Encounter (Signed)
Pt is interested in a sample for the following medications. She is currently not able to get these medications because of the cost as she does not have insurance. Please follow up with friend Diane as pt's number is low on minutes.   levothyroxine (SYNTHROID, LEVOTHROID) 125 MCG tablet atenolol (TENORMIN) 50 MG tablet amLODipine (NORVASC) 10 MG tablet QUEtiapine (SEROQUEL XR) 50 MG TB24 albuterol (PROVENTIL HFA;VENTOLIN HFA) 108 (90 BASE) MCG/ACT inhaler docusate sodium (COLACE) 100 MG capsule polycarbophil (FIBERCON) 625 MG tablet

## 2015-01-29 NOTE — Progress Notes (Signed)
MRN: [id number] Name: [name]  Sex: female Age: 54 y.o. DOB: 10/07/1960  Allergies: Benazepril; Penicillins; Sulfa antibiotics; Ciprofloxacin hcl; Clindamycin/lincomycin; Erythromycin; Other; and Zithromax  Chief Complaint  Patient presents with  . Cyst    HPI: Patient is 54 y.o. female who has history of hypertension hypothyroidism, bipolar disorder comes today complaining of noticing a nodule in her neck blow the left ear for the last 3-4 weeks, as per patient it was painful but not much currently, not sure about change in the size she currently denies any fever chills chest and shortness of breath denies any change in bowel habits, she also complaining of lower back pain for the last few days, denies any urinary or stool incontinence.  Past Medical History  Diagnosis Date  . Complication of anesthesia     difficult to wake up and blood pressure drops  . Heart murmur   . Hypertension     sees Dr. Shana ChuteSpruill, is patients primary  . Dysrhythmia     no medication treatment  . Chronic bronchitis   . Pneumonia     hx  . Hypothyroidism     takes synthroid  . Anemia   . Headache(784.0)   . Arthritis   . Bursitis     hx of  . Mental disorder     bipolar  . Anxiety   . Depression   . Eczema   . GERD (gastroesophageal reflux disease)   . Diverticula of intestine     Past Surgical History  Procedure Laterality Date  . Cesarean section    . Tubal ligation    . Abdominal hysterectomy      has both of ovaries  . Colonoscopy    . Lumbar laminectomy  08/22/2011    Procedure: MICRODISCECTOMY LUMBAR LAMINECTOMY;  Surgeon: Dorian HeckleJoseph D Stern, MD;  Location: [facility];  Service: Neurosurgery;  Laterality: Right;  RIGHT Lumbar Five-Sacral One microdiskectomy      Medication List       This list is accurate as of: 01/29/15 12:31 PM.  Always use your most recent med list.               albuterol 108 (90 BASE) MCG/ACT inhaler  Commonly known as:  PROVENTIL  HFA;VENTOLIN HFA  Inhale 2 puffs into the lungs every 4 (four) hours as needed for wheezing or shortness of breath.     amLODipine 10 MG tablet  Commonly known as:  NORVASC  Take 1 tablet (10 mg total) by mouth daily. Take this instead of your LOTREL until you see your regular doctor.     atenolol 50 MG tablet  Commonly known as:  TENORMIN  Take 50 mg by mouth daily.     clorazepate 7.5 MG tablet  Commonly known as:  TRANXENE  Take 7.5 mg by mouth 2 (two) times daily.     cyclobenzaprine 10 MG tablet  Commonly known as:  FLEXERIL  Take 1 tablet (10 mg total) by mouth at bedtime.     diphenhydrAMINE 25 MG tablet  Commonly known as:  BENADRYL  Take 1 tablet (25 mg total) by mouth every 6 (six) hours.     docusate sodium 100 MG capsule  Commonly known as:  COLACE  Take 100 mg by mouth daily.     hydrocortisone cream 1 %  Apply 1 application topically 2 (two) times daily.     levothyroxine 125 MCG tablet  Commonly known as:  SYNTHROID, LEVOTHROID  Take 125  mcg by mouth daily.     Oxycodone HCl 10 MG Tabs  Take 1 tablet (10 mg total) by mouth every 6 (six) hours as needed for severe pain.     polycarbophil 625 MG tablet  Commonly known as:  FIBERCON  Take 625 mg by mouth daily.     QUEtiapine 50 MG Tb24 24 hr tablet  Commonly known as:  SEROQUEL XR  Take 50 mg by mouth at bedtime.     saccharomyces boulardii 250 MG capsule  Commonly known as:  FLORASTOR  Take 1 capsule (250 mg total) by mouth 2 (two) times daily.     triamcinolone ointment 0.1 %  Commonly known as:  KENALOG  Apply 1 application topically daily. All over.     VITAMIN D (CHOLECALCIFEROL) PO  Take 1 tablet by mouth daily.        Meds ordered this encounter  Medications  . cyclobenzaprine (FLEXERIL) 10 MG tablet    Sig: Take 1 tablet (10 mg total) by mouth at bedtime.    Dispense:  30 tablet    Refill:  1     There is no immunization history on file for this patient.  Family History    Problem Relation Age of Onset  . Anesthesia problems Mother   . Cancer Mother     thyroid  . Hypertension Mother   . Anesthesia problems Sister   . Hypertension Sister   . Cancer Maternal Aunt   . Cancer Maternal Aunt   . Colon cancer Sister   . Stroke Sister   . Hypertension Brother     History  Substance Use Topics  . Smoking status: Current Every Day Smoker -- 0.25 packs/day for 20 years    Types: Cigarettes  . Smokeless tobacco: Never Used  . Alcohol Use: No     Comment: occ    Review of Systems   As noted in HPI  Filed Vitals:   01/29/15 1136  BP: 129/78  Pulse: 67  Temp: 98.9 F (37.2 C)  Resp: 16    Physical Exam  Physical Exam  Constitutional: No distress.  Eyes: EOM are normal. Pupils are equal, round, and reactive to light.  Neck:  Small cervical node palpable on left minimally tender   Cardiovascular: Normal rate and regular rhythm.   Pulmonary/Chest: Breath sounds normal. No respiratory distress. She has no wheezes. She has no rales.  Musculoskeletal:  Left lower lumbar paraspinal tenderness   Lymphadenopathy:    She has cervical adenopathy.    CBC    Component Value Date/Time   WBC 5.0 11/24/2014 0521   RBC 4.37 11/24/2014 0521   HGB 12.3 11/24/2014 0521   HCT 39.2 11/24/2014 0521   PLT 313 11/24/2014 0521   MCV 89.7 11/24/2014 0521   LYMPHSABS 1.4 11/23/2014 1151   MONOABS 0.4 11/23/2014 1151   EOSABS 0.1 11/23/2014 1151   BASOSABS 0.0 11/23/2014 1151    CMP     Component Value Date/Time   NA 142 12/09/2014 1522   K 5.7* 12/09/2014 1522   CL 101 12/09/2014 1522   CO2 30 12/09/2014 1522   GLUCOSE 96 12/09/2014 1522   BUN 9 12/09/2014 1522   CREATININE 0.80 12/09/2014 1522   CREATININE 0.78 11/24/2014 0521   CALCIUM 9.9 12/09/2014 1522   PROT 7.2 12/09/2014 1522   ALBUMIN 4.3 12/09/2014 1522   AST 20 12/09/2014 1522   ALT 20 12/09/2014 1522   ALKPHOS 88 12/09/2014 1522   BILITOT 0.4  12/09/2014 1522   GFRNONAA 83  12/09/2014 1522   GFRNONAA >90 11/24/2014 0521   GFRAA >89 12/09/2014 1522   GFRAA >90 11/24/2014 0521    No results found for: CHOL  No results found for: HGBA1C  Lab Results  Component Value Date/Time   AST 20 12/09/2014 03:22 PM    Assessment and Plan  Cervical lymphadenopathy - Plan: we'll check CBC with Differential/Platelet, she did report family history of lymphoma, since the nodule was painful will observe for now reevaluate in 2-3 months if it is persistent consider referral to surgery.  Back muscle spasm - Plan: advised patient to apply heating pad, trial of cyclobenzaprine (FLEXERIL) 10 MG tablet  Hyperkalemia - Plan: will repeat COMPLETE METABOLIC PANEL WITH GFR    Return in about 3 months (around 05/01/2015), or if symptoms worsen or fail to improve.   This note has been created with Education officer, environmental. Any transcriptional errors are unintentional.    Doris Cheadle, MD

## 2015-02-03 ENCOUNTER — Ambulatory Visit: Payer: Medicaid Other | Attending: Internal Medicine

## 2015-02-03 ENCOUNTER — Telehealth: Payer: Self-pay | Admitting: General Practice

## 2015-02-03 NOTE — Telephone Encounter (Signed)
Patient presents to check up window, following her Financial appointment to request lab results from [facility] 01/29/15. Please assist

## 2015-02-04 ENCOUNTER — Telehealth: Payer: Self-pay

## 2015-02-04 NOTE — Telephone Encounter (Signed)
-----   Message from Doris Cheadleeepak Advani, MD sent at 02/02/2015  9:35 AM EDT ----- Call and let the patient know that her potassium level is in normal range .

## 2015-02-04 NOTE — Telephone Encounter (Signed)
Patient not available Left message on voice mail to return our call 

## 2015-02-09 ENCOUNTER — Encounter: Payer: Self-pay | Admitting: Internal Medicine

## 2015-02-09 ENCOUNTER — Ambulatory Visit: Payer: Medicaid Other | Attending: Internal Medicine | Admitting: Internal Medicine

## 2015-02-09 VITALS — BP 146/82 | HR 81 | Temp 98.0°F | Resp 16 | Wt 193.6 lb

## 2015-02-09 DIAGNOSIS — I10 Essential (primary) hypertension: Secondary | ICD-10-CM | POA: Diagnosis not present

## 2015-02-09 DIAGNOSIS — Z720 Tobacco use: Secondary | ICD-10-CM | POA: Diagnosis not present

## 2015-02-09 DIAGNOSIS — F17200 Nicotine dependence, unspecified, uncomplicated: Secondary | ICD-10-CM

## 2015-02-09 MED ORDER — ATENOLOL 50 MG PO TABS: 50.0000 mg | ORAL_TABLET | Freq: Every day | ORAL | Status: DC

## 2015-02-09 NOTE — Progress Notes (Signed)
MRN: [id number] Name: [name]  Sex: female Age: 54 y.o. DOB: 07-01-1961  Allergies: Benazepril; Penicillins; Sulfa antibiotics; Ciprofloxacin hcl; Clindamycin/lincomycin; Erythromycin; Other; and Zithromax  Chief Complaint  Patient presents with  . Follow-up    HPI: Patient is 54 y.o. female who to her history of hypertension, comes today for followup and review the blood work, in the past her potassium level was high which is improved her patient also had lymphadenopathy her CBC does not show any leukocytosis, she denies any change in cervical lymph nodes denies any pain, patient is requesting refill on her blood pressure medication, she denies any headache dizziness chest and shortness of breath, she still smokes cigarettes, I have counseled patient to quit smoking.  Past Medical History  Diagnosis Date  . Complication of anesthesia     difficult to wake up and blood pressure drops  . Heart murmur   . Hypertension     sees Dr. Shana Chute, is patients primary  . Dysrhythmia     no medication treatment  . Chronic bronchitis   . Pneumonia     hx  . Hypothyroidism     takes synthroid  . Anemia   . Headache(784.0)   . Arthritis   . Bursitis     hx of  . Mental disorder     bipolar  . Anxiety   . Depression   . Eczema   . GERD (gastroesophageal reflux disease)   . Diverticula of intestine     Past Surgical History  Procedure Laterality Date  . Cesarean section    . Tubal ligation    . Abdominal hysterectomy      has both of ovaries  . Colonoscopy    . Lumbar laminectomy  08/22/2011    Procedure: MICRODISCECTOMY LUMBAR LAMINECTOMY;  Surgeon: Dorian Heckle, MD;  Location: [facility];  Service: Neurosurgery;  Laterality: Right;  RIGHT Lumbar Five-Sacral One microdiskectomy      Medication List       This list is accurate as of: 02/09/15 11:26 AM.  Always use your most recent med list.               albuterol 108 (90 BASE) MCG/ACT inhaler  Commonly  known as:  PROVENTIL HFA;VENTOLIN HFA  Inhale 2 puffs into the lungs every 4 (four) hours as needed for wheezing or shortness of breath.     amLODipine 10 MG tablet  Commonly known as:  NORVASC  Take 1 tablet (10 mg total) by mouth daily. Take this instead of your LOTREL until you see your regular doctor.     atenolol 50 MG tablet  Commonly known as:  TENORMIN  Take 1 tablet (50 mg total) by mouth daily.     clorazepate 7.5 MG tablet  Commonly known as:  TRANXENE  Take 7.5 mg by mouth 2 (two) times daily.     cyclobenzaprine 10 MG tablet  Commonly known as:  FLEXERIL  Take 1 tablet (10 mg total) by mouth at bedtime.     diphenhydrAMINE 25 MG tablet  Commonly known as:  BENADRYL  Take 1 tablet (25 mg total) by mouth every 6 (six) hours.     docusate sodium 100 MG capsule  Commonly known as:  COLACE  Take 100 mg by mouth daily.     hydrocortisone cream 1 %  Apply 1 application topically 2 (two) times daily.     levothyroxine 125 MCG tablet  Commonly known as:  SYNTHROID, LEVOTHROID  Take 125 mcg by mouth daily.     Oxycodone HCl 10 MG Tabs  Take 1 tablet (10 mg total) by mouth every 6 (six) hours as needed for severe pain.     polycarbophil 625 MG tablet  Commonly known as:  FIBERCON  Take 625 mg by mouth daily.     QUEtiapine 50 MG Tb24 24 hr tablet  Commonly known as:  SEROQUEL XR  Take 50 mg by mouth at bedtime.     saccharomyces boulardii 250 MG capsule  Commonly known as:  FLORASTOR  Take 1 capsule (250 mg total) by mouth 2 (two) times daily.     triamcinolone ointment 0.1 %  Commonly known as:  KENALOG  Apply 1 application topically daily. All over.     VITAMIN D (CHOLECALCIFEROL) PO  Take 1 tablet by mouth daily.        Meds ordered this encounter  Medications  . atenolol (TENORMIN) 50 MG tablet    Sig: Take 1 tablet (50 mg total) by mouth daily.    Dispense:  30 tablet    Refill:  3     There is no immunization history on file for this  patient.  Family History  Problem Relation Age of Onset  . Anesthesia problems Mother   . Cancer Mother     thyroid  . Hypertension Mother   . Anesthesia problems Sister   . Hypertension Sister   . Cancer Maternal Aunt   . Cancer Maternal Aunt   . Colon cancer Sister   . Stroke Sister   . Hypertension Brother     History  Substance Use Topics  . Smoking status: Current Every Day Smoker -- 0.25 packs/day for 20 years    Types: Cigarettes  . Smokeless tobacco: Never Used  . Alcohol Use: No     Comment: occ    Review of Systems   As noted in HPI  Filed Vitals:   02/09/15 1105  BP: 146/82  Pulse: 81  Temp: 98 F (36.7 C)  Resp: 16    Physical Exam  Physical Exam  HENT:  Again noted Small cervical node palpable on left minimally tender    Eyes: EOM are normal. Pupils are equal, round, and reactive to light.  Cardiovascular: Normal rate and regular rhythm.   Pulmonary/Chest: Breath sounds normal. No respiratory distress. She has no wheezes. She has no rales.    CBC    Component Value Date/Time   WBC 4.8 01/29/2015 1216   RBC 5.14* 01/29/2015 1216   HGB 14.2 01/29/2015 1216   HCT 44.2 01/29/2015 1216   PLT 277 01/29/2015 1216   MCV 86.0 01/29/2015 1216   LYMPHSABS 2.4 01/29/2015 1216   MONOABS 0.4 01/29/2015 1216   EOSABS 0.1 01/29/2015 1216   BASOSABS 0.0 01/29/2015 1216    CMP     Component Value Date/Time   NA 139 01/29/2015 1216   K 4.0 01/29/2015 1216   CL 103 01/29/2015 1216   CO2 30 01/29/2015 1216   GLUCOSE 91 01/29/2015 1216   BUN 11 01/29/2015 1216   CREATININE 0.72 01/29/2015 1216   CREATININE 0.78 11/24/2014 0521   CALCIUM 9.4 01/29/2015 1216   PROT 6.7 01/29/2015 1216   ALBUMIN 3.9 01/29/2015 1216   AST 13 01/29/2015 1216   ALT 11 01/29/2015 1216   ALKPHOS 105 01/29/2015 1216   BILITOT 0.6 01/29/2015 1216   GFRNONAA >89 01/29/2015 1216   GFRNONAA >90 11/24/2014 0521   GFRAA >89 01/29/2015  1216   GFRAA >90 11/24/2014 0521     No results found for: CHOL  No results found for: HGBA1C  Lab Results  Component Value Date/Time   AST 13 01/29/2015 12:16 PM    Assessment and Plan  Essential hypertension - Plan:blood pressure is borderline elevated, advise patient for DASH diet, continue with  atenolol (TENORMIN) 50 MG tablet  Smoking Again counseled patient to quit smoking.  Patient will follow up in 3 months if she has persistent cervical lymphadenopathy consider referral to surgery   Return in about 3 months (around 05/12/2015), or if symptoms worsen or fail to improve, for hypertension.   This note has been created with Education officer, environmentalDragon speech recognition software and smart phrase technology. Any transcriptional errors are unintentional.    Doris CheadleADVANI, Mamye Bolds, MD

## 2015-02-09 NOTE — Progress Notes (Signed)
Patient here for follow up on her hypertension and in need of a medication refill

## 2015-02-09 NOTE — Patient Instructions (Addendum)
DASH Eating Plan DASH stands for "Dietary Approaches to Stop Hypertension." The DASH eating plan is a healthy eating plan that has been shown to reduce high blood pressure (hypertension). Additional health benefits may include reducing the risk of type 2 diabetes mellitus, heart disease, and stroke. The DASH eating plan may also help with weight loss. WHAT DO I NEED TO KNOW ABOUT THE DASH EATING PLAN? For the DASH eating plan, you will follow these general guidelines:  Choose foods with a percent daily value for sodium of less than 5% (as listed on the food label).  Use salt-free seasonings or herbs instead of table salt or sea salt.  Check with your health care provider or pharmacist before using salt substitutes.  Eat lower-sodium products, often labeled as "lower sodium" or "no salt added."  Eat fresh foods.  Eat more vegetables, fruits, and low-fat dairy products.  Choose whole grains. Look for the word "whole" as the first word in the ingredient list.  Choose fish and skinless chicken or turkey more often than red meat. Limit fish, poultry, and meat to 6 oz (170 g) each day.  Limit sweets, desserts, sugars, and sugary drinks.  Choose heart-healthy fats.  Limit cheese to 1 oz (28 g) per day.  Eat more home-cooked food and less restaurant, buffet, and fast food.  Limit fried foods.  Cook foods using methods other than frying.  Limit canned vegetables. If you do use them, rinse them well to decrease the sodium.  When eating at a restaurant, ask that your food be prepared with less salt, or no salt if possible. WHAT FOODS CAN I EAT? Seek help from a dietitian for individual calorie needs. Grains Whole grain or whole wheat bread. Brown rice. Whole grain or whole wheat pasta. Quinoa, bulgur, and whole grain cereals. Low-sodium cereals. Corn or whole wheat flour tortillas. Whole grain cornbread. Whole grain crackers. Low-sodium crackers. Vegetables Fresh or frozen vegetables  (raw, steamed, roasted, or grilled). Low-sodium or reduced-sodium tomato and vegetable juices. Low-sodium or reduced-sodium tomato sauce and paste. Low-sodium or reduced-sodium canned vegetables.  Fruits All fresh, canned (in natural juice), or frozen fruits. Meat and Other Protein Products Ground beef (85% or leaner), grass-fed beef, or beef trimmed of fat. Skinless chicken or turkey. Ground chicken or turkey. Pork trimmed of fat. All fish and seafood. Eggs. Dried beans, peas, or lentils. Unsalted nuts and seeds. Unsalted canned beans. Dairy Low-fat dairy products, such as skim or 1% milk, 2% or reduced-fat cheeses, low-fat ricotta or cottage cheese, or plain low-fat yogurt. Low-sodium or reduced-sodium cheeses. Fats and Oils Tub margarines without trans fats. Light or reduced-fat mayonnaise and salad dressings (reduced sodium). Avocado. Safflower, olive, or canola oils. Natural peanut or almond butter. Other Unsalted popcorn and pretzels. The items listed above may not be a complete list of recommended foods or beverages. Contact your dietitian for more options. WHAT FOODS ARE NOT RECOMMENDED? Grains White bread. White pasta. White rice. Refined cornbread. Bagels and croissants. Crackers that contain trans fat. Vegetables Creamed or fried vegetables. Vegetables in a cheese sauce. Regular canned vegetables. Regular canned tomato sauce and paste. Regular tomato and vegetable juices. Fruits Dried fruits. Canned fruit in light or heavy syrup. Fruit juice. Meat and Other Protein Products Fatty cuts of meat. Ribs, chicken wings, bacon, sausage, bologna, salami, chitterlings, fatback, hot dogs, bratwurst, and packaged luncheon meats. Salted nuts and seeds. Canned beans with salt. Dairy Whole or 2% milk, cream, half-and-half, and cream cheese. Whole-fat or sweetened yogurt. Full-fat   cheeses or blue cheese. Nondairy creamers and whipped toppings. Processed cheese, cheese spreads, or cheese  curds. Condiments Onion and garlic salt, seasoned salt, table salt, and sea salt. Canned and packaged gravies. Worcestershire sauce. Tartar sauce. Barbecue sauce. Teriyaki sauce. Soy sauce, including reduced sodium. Steak sauce. Fish sauce. Oyster sauce. Cocktail sauce. Horseradish. Ketchup and mustard. Meat flavorings and tenderizers. Bouillon cubes. Hot sauce. Tabasco sauce. Marinades. Taco seasonings. Relishes. Fats and Oils Butter, stick margarine, lard, shortening, ghee, and bacon fat. Coconut, palm kernel, or palm oils. Regular salad dressings. Other Pickles and olives. Salted popcorn and pretzels. The items listed above may not be a complete list of foods and beverages to avoid. Contact your dietitian for more information. WHERE CAN I FIND MORE INFORMATION? National Heart, Lung, and Blood Institute: CableProm[employer].itwww.nhlbi.nih.gov/health/health-topics/topics/dash/ Document Released: 08/10/2011 Document Revised: 01/05/2014 Document Reviewed: 06/25/2013 [facility] Patient Information 2015 [employer], [place]. This information is not intended to replace advice given to you by your health care provider. Make sure you discuss any questions you have with your health care provider. sSmoking Cessation Quitting smoking is important to your health and has many advantages. However, it is not always easy to quit since nicotine is a very addictive drug. Oftentimes, people try 3 times or more before being able to quit. This document explains the best ways for you to prepare to quit smoking. Quitting takes hard work and a lot of effort, but you can do it. ADVANTAGES OF QUITTING SMOKING  You will live longer, feel better, and live better.  Your body will feel the impact of quitting smoking almost immediately.  Within 20 minutes, blood pressure decreases. Your pulse returns to its normal level.  After 8 hours, carbon monoxide levels in the blood return to normal. Your oxygen level increases.  After 24 hours, the chance of  having a heart attack starts to decrease. Your breath, hair, and body stop smelling like smoke.  After 48 hours, damaged nerve endings begin to recover. Your sense of taste and smell improve.  After 72 hours, the body is virtually free of nicotine. Your bronchial tubes relax and breathing becomes easier.  After 2 to 12 weeks, lungs can hold more air. Exercise becomes easier and circulation improves.  The risk of having a heart attack, stroke, cancer, or lung disease is greatly reduced.  After 1 year, the risk of coronary heart disease is cut in half.  After 5 years, the risk of stroke falls to the same as a nonsmoker.  After 10 years, the risk of lung cancer is cut in half and the risk of other cancers decreases significantly.  After 15 years, the risk of coronary heart disease drops, usually to the level of a nonsmoker.  If you are pregnant, quitting smoking will improve your chances of having a healthy baby.  The people you live with, especially any children, will be healthier.  You will have extra money to spend on things other than cigarettes. QUESTIONS TO THINK ABOUT BEFORE ATTEMPTING TO QUIT You may want to talk about your answers with your health care provider.  Why do you want to quit?  If you tried to quit in the past, what helped and what did not?  What will be the most difficult situations for you after you quit? How will you plan to handle them?  Who can help you through the tough times? Your family? Friends? A health care provider?  What pleasures do you get from smoking? What ways can you still get pleasure  if you quit? Here are some questions to ask your health care provider:  How can you help me to be successful at quitting?  What medicine do you think would be best for me and how should I take it?  What should I do if I need more help?  What is smoking withdrawal like? How can I get information on withdrawal? GET READY  Set a quit date.  Change your  environment by getting rid of all cigarettes, ashtrays, matches, and lighters in your home, car, or work. Do not let people smoke in your home.  Review your past attempts to quit. Think about what worked and what did not. GET SUPPORT AND ENCOURAGEMENT You have a better chance of being successful if you have help. You can get support in many ways.  Tell your family, friends, and coworkers that you are going to quit and need their support. Ask them not to smoke around you.  Get individual, group, or telephone counseling and support. Programs are available at local hospitals and health centers. Call your local health department for information about programs in your area.  Spiritual beliefs and practices may help some smokers quit.  Download a "quit meter" on your computer to keep track of quit statistics, such as how long you have gone without smoking, cigarettes not smoked, and money saved.  Get a self-help book about quitting smoking and staying off tobacco. LEARN NEW SKILLS AND BEHAVIORS  Distract yourself from urges to smoke. Talk to someone, go for a walk, or occupy your time with a task.  Change your normal routine. Take a different route to work. Drink tea instead of coffee. Eat breakfast in a different place.  Reduce your stress. Take a hot bath, exercise, or read a book.  Plan something enjoyable to do every day. Reward yourself for not smoking.  Explore interactive web-based programs that specialize in helping you quit. GET MEDICINE AND USE IT CORRECTLY Medicines can help you stop smoking and decrease the urge to smoke. Combining medicine with the above behavioral methods and support can greatly increase your chances of successfully quitting smoking.  Nicotine replacement therapy helps deliver nicotine to your body without the negative effects and risks of smoking. Nicotine replacement therapy includes nicotine gum, lozenges, inhalers, nasal sprays, and skin patches. Some may be  available over-the-counter and others require a prescription.  Antidepressant medicine helps people abstain from smoking, but how this works is unknown. This medicine is available by prescription.  Nicotinic receptor partial agonist medicine simulates the effect of nicotine in your brain. This medicine is available by prescription. Ask your health care provider for advice about which medicines to use and how to use them based on your health history. Your health care provider will tell you what side effects to look out for if you choose to be on a medicine or therapy. Carefully read the information on the package. Do not use any other product containing nicotine while using a nicotine replacement product.  RELAPSE OR DIFFICULT SITUATIONS Most relapses occur within the first 3 months after quitting. Do not be discouraged if you start smoking again. Remember, most people try several times before finally quitting. You may have symptoms of withdrawal because your body is used to nicotine. You may crave cigarettes, be irritable, feel very hungry, cough often, get headaches, or have difficulty concentrating. The withdrawal symptoms are only temporary. They are strongest when you first quit, but they will go away within 10-14 days. To reduce the   chances of relapse, try to:  Avoid drinking alcohol. Drinking lowers your chances of successfully quitting.  Reduce the amount of caffeine you consume. Once you quit smoking, the amount of caffeine in your body increases and can give you symptoms, such as a rapid heartbeat, sweating, and anxiety.  Avoid smokers because they can make you want to smoke.  Do not let weight gain distract you. Many smokers will gain weight when they quit, usually less than 10 pounds. Eat a healthy diet and stay active. You can always lose the weight gained after you quit.  Find ways to improve your mood other than smoking. FOR MORE INFORMATION  www.smokefree.gov  Document Released:  08/15/2001 Document Revised: 01/05/2014 Document Reviewed: 11/30/2011 ExitCare Patient Information 2015 [employer]. This information is not intended to replace advice given to you by your health care provider. Make sure you discuss any questions you have with your health care provider.  

## 2015-02-19 ENCOUNTER — Ambulatory Visit: Payer: Medicaid Other

## 2015-03-15 NOTE — Telephone Encounter (Signed)
Pt was seen on 02/09/2015 for office visit.

## 2015-03-15 NOTE — Telephone Encounter (Signed)
Pt was seen on 02/09/2015

## 2015-03-30 ENCOUNTER — Ambulatory Visit: Payer: Medicaid Other | Admitting: Internal Medicine

## 2015-04-07 ENCOUNTER — Ambulatory Visit
Admission: RE | Admit: 2015-04-07 | Discharge: 2015-04-07 | Disposition: A | Discharge status: Home / Self Care | Payer: Medicaid Other | Source: Ambulatory Visit | Attending: Internal Medicine | Admitting: Internal Medicine

## 2015-04-07 DIAGNOSIS — Z1231 Encounter for screening mammogram for malignant neoplasm of breast: Secondary | ICD-10-CM

## 2015-05-13 ENCOUNTER — Telehealth: Payer: Self-pay | Admitting: Internal Medicine

## 2015-05-13 NOTE — Telephone Encounter (Signed)
Patient called requesting a refill on all current medication. Please f/u.

## 2015-05-19 ENCOUNTER — Other Ambulatory Visit: Payer: Self-pay | Admitting: Internal Medicine

## 2015-05-19 NOTE — Telephone Encounter (Signed)
Patient called to request a med refill on all of her current medications. Please f/u

## 2015-08-09 MED ORDER — AMLODIPINE BESYLATE 10 MG PO TABS: 10.0000 mg | ORAL_TABLET | Freq: Every day | ORAL | Status: DC

## 2015-08-13 ENCOUNTER — Other Ambulatory Visit: Payer: Self-pay | Admitting: Internal Medicine

## 2015-08-15 ENCOUNTER — Inpatient Hospital Stay (HOSPITAL_COMMUNITY)
Admission: EM | Admit: 2015-08-15 | Discharge: 2015-08-22 | DRG: 982 | Disposition: A | Discharge status: Home / Self Care | Payer: Medicaid Other | Attending: Dentistry | Admitting: Dentistry

## 2015-08-15 ENCOUNTER — Emergency Department (HOSPITAL_COMMUNITY): Payer: Medicaid Other

## 2015-08-15 ENCOUNTER — Encounter (HOSPITAL_COMMUNITY): Payer: Self-pay | Admitting: Emergency Medicine

## 2015-08-15 DIAGNOSIS — K029 Dental caries, unspecified: Secondary | ICD-10-CM | POA: Diagnosis present

## 2015-08-15 DIAGNOSIS — F419 Anxiety disorder, unspecified: Secondary | ICD-10-CM | POA: Diagnosis present

## 2015-08-15 DIAGNOSIS — E039 Hypothyroidism, unspecified: Secondary | ICD-10-CM | POA: Diagnosis present

## 2015-08-15 DIAGNOSIS — F17210 Nicotine dependence, cigarettes, uncomplicated: Secondary | ICD-10-CM | POA: Diagnosis present

## 2015-08-15 DIAGNOSIS — K0530 Chronic periodontitis, unspecified: Secondary | ICD-10-CM | POA: Diagnosis present

## 2015-08-15 DIAGNOSIS — Z882 Allergy status to sulfonamides status: Secondary | ICD-10-CM

## 2015-08-15 DIAGNOSIS — K5720 Diverticulitis of large intestine with perforation and abscess without bleeding: Principal | ICD-10-CM | POA: Diagnosis present

## 2015-08-15 DIAGNOSIS — Z888 Allergy status to other drugs, medicaments and biological substances status: Secondary | ICD-10-CM

## 2015-08-15 DIAGNOSIS — K047 Periapical abscess without sinus: Secondary | ICD-10-CM | POA: Diagnosis present

## 2015-08-15 DIAGNOSIS — M549 Dorsalgia, unspecified: Secondary | ICD-10-CM | POA: Diagnosis present

## 2015-08-15 DIAGNOSIS — K5792 Diverticulitis of intestine, part unspecified, without perforation or abscess without bleeding: Secondary | ICD-10-CM | POA: Diagnosis present

## 2015-08-15 DIAGNOSIS — Z8701 Personal history of pneumonia (recurrent): Secondary | ICD-10-CM | POA: Diagnosis not present

## 2015-08-15 DIAGNOSIS — K219 Gastro-esophageal reflux disease without esophagitis: Secondary | ICD-10-CM | POA: Diagnosis present

## 2015-08-15 DIAGNOSIS — G8929 Other chronic pain: Secondary | ICD-10-CM | POA: Diagnosis present

## 2015-08-15 DIAGNOSIS — I10 Essential (primary) hypertension: Secondary | ICD-10-CM | POA: Diagnosis present

## 2015-08-15 DIAGNOSIS — K083 Retained dental root: Secondary | ICD-10-CM | POA: Diagnosis present

## 2015-08-15 DIAGNOSIS — K0401 Reversible pulpitis: Secondary | ICD-10-CM | POA: Diagnosis present

## 2015-08-15 DIAGNOSIS — F319 Bipolar disorder, unspecified: Secondary | ICD-10-CM | POA: Diagnosis present

## 2015-08-15 DIAGNOSIS — J42 Unspecified chronic bronchitis: Secondary | ICD-10-CM | POA: Diagnosis present

## 2015-08-15 DIAGNOSIS — Z881 Allergy status to other antibiotic agents status: Secondary | ICD-10-CM | POA: Diagnosis not present

## 2015-08-15 DIAGNOSIS — M264 Malocclusion, unspecified: Secondary | ICD-10-CM | POA: Diagnosis present

## 2015-08-15 DIAGNOSIS — K045 Chronic apical periodontitis: Secondary | ICD-10-CM | POA: Diagnosis present

## 2015-08-15 DIAGNOSIS — Z880 Allergy status to penicillin: Secondary | ICD-10-CM

## 2015-08-15 DIAGNOSIS — K0381 Cracked tooth: Secondary | ICD-10-CM | POA: Diagnosis present

## 2015-08-15 LAB — URINALYSIS, ROUTINE W REFLEX MICROSCOPIC
Bilirubin Urine: NEGATIVE
Glucose, UA: NEGATIVE mg/dL
Ketones, ur: NEGATIVE mg/dL
Leukocytes, UA: NEGATIVE
Nitrite: NEGATIVE
Protein, ur: NEGATIVE mg/dL
Specific Gravity, Urine: 1.009 (ref 1.005–1.030)
pH: 6 (ref 5.0–8.0)

## 2015-08-15 LAB — COMPREHENSIVE METABOLIC PANEL
ALT: 47 U/L (ref 14–54)
AST: 51 U/L — ABNORMAL HIGH (ref 15–41)
Albumin: 3.1 g/dL — ABNORMAL LOW (ref 3.5–5.0)
Alkaline Phosphatase: 99 U/L (ref 38–126)
Anion gap: 8 (ref 5–15)
BUN: 7 mg/dL (ref 6–20)
CO2: 28 mmol/L (ref 22–32)
Calcium: 9.3 mg/dL (ref 8.9–10.3)
Chloride: 102 mmol/L (ref 101–111)
Creatinine, Ser: 0.95 mg/dL (ref 0.44–1.00)
GFR calc Af Amer: >60 mL/min (ref >60)
GFR calc non Af Amer: >60 mL/min (ref >60)
Glucose, Bld: 109 mg/dL — ABNORMAL HIGH (ref 65–99)
Potassium: 3.8 mmol/L (ref 3.5–5.1)
Sodium: 138 mmol/L (ref 135–145)
Total Bilirubin: 0.6 mg/dL (ref 0.3–1.2)
Total Protein: 6.5 g/dL (ref 6.5–8.1)

## 2015-08-15 LAB — CBC
HCT: 39.7 % (ref 36.0–46.0)
Hemoglobin: 12.8 g/dL (ref 12.0–15.0)
MCH: 28.4 pg (ref 26.0–34.0)
MCHC: 32.2 g/dL (ref 30.0–36.0)
MCV: 88 fL (ref 78.0–100.0)
Platelets: 236 10*3/uL (ref 150–400)
RBC: 4.51 MIL/uL (ref 3.87–5.11)
RDW: 13.1 % (ref 11.5–15.5)
WBC: 10.5 10*3/uL (ref 4.0–10.5)

## 2015-08-15 LAB — URINE MICROSCOPIC-ADD ON
Bacteria, UA: NONE SEEN
WBC, UA: NONE SEEN WBC/hpf (ref 0–5)

## 2015-08-15 LAB — LIPASE, BLOOD: Lipase: 23 U/L (ref 11–51)

## 2015-08-15 LAB — I-STAT CG4 LACTIC ACID, ED: Lactic Acid, Venous: 0.8 mmol/L (ref 0.5–2.0)

## 2015-08-15 MED ORDER — CLORAZEPATE DIPOTASSIUM 3.75 MG PO TABS
7.5000 mg | ORAL_TABLET | Freq: Two times a day (BID) | ORAL | Status: DC
  Administered 2015-08-15 – 2015-08-21 (×11): 7.5 mg via ORAL
  Filled 2015-08-15 (×12): qty 2

## 2015-08-15 MED ORDER — HYDROMORPHONE HCL 1 MG/ML IJ SOLN
1.0000 mg | Freq: Once | INTRAMUSCULAR | Status: AC
  Administered 2015-08-15: 1 mg via INTRAVENOUS
  Filled 2015-08-15: qty 1

## 2015-08-15 MED ORDER — METRONIDAZOLE IN NACL 5-0.79 MG/ML-% IV SOLN
500.0000 mg | Freq: Three times a day (TID) | INTRAVENOUS | Status: DC
  Administered 2015-08-16 – 2015-08-18 (×7): 500 mg via INTRAVENOUS
  Filled 2015-08-15 (×10): qty 100

## 2015-08-15 MED ORDER — ALBUTEROL SULFATE HFA 108 (90 BASE) MCG/ACT IN AERS: 2.0000 | INHALATION_SPRAY | RESPIRATORY_TRACT | Status: DC | PRN

## 2015-08-15 MED ORDER — QUETIAPINE FUMARATE ER 50 MG PO TB24
50.0000 mg | ORAL_TABLET | Freq: Every day | ORAL | Status: DC
  Administered 2015-08-15 – 2015-08-21 (×6): 50 mg via ORAL
  Filled 2015-08-15 (×9): qty 1

## 2015-08-15 MED ORDER — ACETAMINOPHEN 650 MG RE SUPP: 650.0000 mg | Freq: Four times a day (QID) | RECTAL | Status: DC | PRN

## 2015-08-15 MED ORDER — HYDROMORPHONE HCL 1 MG/ML IJ SOLN
1.0000 mg | INTRAMUSCULAR | Status: DC | PRN
  Administered 2015-08-15 – 2015-08-18 (×8): 1 mg via INTRAVENOUS
  Filled 2015-08-15 (×9): qty 1

## 2015-08-15 MED ORDER — ALBUTEROL SULFATE (2.5 MG/3ML) 0.083% IN NEBU: 2.5000 mg | INHALATION_SOLUTION | RESPIRATORY_TRACT | Status: DC | PRN

## 2015-08-15 MED ORDER — LEVOTHYROXINE SODIUM 25 MCG PO TABS
125.0000 ug | ORAL_TABLET | Freq: Every day | ORAL | Status: DC
  Administered 2015-08-16 – 2015-08-22 (×6): 125 ug via ORAL
  Filled 2015-08-15 (×8): qty 1

## 2015-08-15 MED ORDER — ACETAMINOPHEN 325 MG PO TABS
650.0000 mg | ORAL_TABLET | Freq: Four times a day (QID) | ORAL | Status: DC | PRN
  Administered 2015-08-16: 650 mg via ORAL
  Filled 2015-08-15: qty 2

## 2015-08-15 MED ORDER — DEXTROSE 5 % IV SOLN
2.0000 g | Freq: Three times a day (TID) | INTRAVENOUS | Status: DC
  Administered 2015-08-15 – 2015-08-19 (×11): 2 g via INTRAVENOUS
  Filled 2015-08-15 (×15): qty 2

## 2015-08-15 MED ORDER — AMLODIPINE BESYLATE 10 MG PO TABS
10.0000 mg | ORAL_TABLET | Freq: Every day | ORAL | Status: DC
  Administered 2015-08-16 – 2015-08-17 (×2): 10 mg via ORAL
  Filled 2015-08-15 (×6): qty 1

## 2015-08-15 MED ORDER — ENOXAPARIN SODIUM 40 MG/0.4ML ~~LOC~~ SOLN
40.0000 mg | SUBCUTANEOUS | Status: DC
Start: 2015-08-16 — End: 2015-08-19
  Administered 2015-08-16 – 2015-08-19 (×4): 40 mg via SUBCUTANEOUS
  Filled 2015-08-15 (×4): qty 0.4

## 2015-08-15 MED ORDER — ONDANSETRON HCL 4 MG/2ML IJ SOLN
4.0000 mg | Freq: Four times a day (QID) | INTRAMUSCULAR | Status: DC | PRN
  Administered 2015-08-20 (×2): 4 mg via INTRAVENOUS
  Filled 2015-08-15: qty 2

## 2015-08-15 MED ORDER — ONDANSETRON 4 MG PO TBDP: 4.0000 mg | ORAL_TABLET | Freq: Four times a day (QID) | ORAL | Status: DC | PRN

## 2015-08-15 MED ORDER — ZOLPIDEM TARTRATE 5 MG PO TABS: 5.0000 mg | ORAL_TABLET | Freq: Every evening | ORAL | Status: DC | PRN

## 2015-08-15 MED ORDER — SODIUM CHLORIDE 0.9 % IV SOLN
INTRAVENOUS | Status: DC
  Administered 2015-08-15: 1 mL via INTRAVENOUS
  Administered 2015-08-16: 22:00:00 via INTRAVENOUS
  Administered 2015-08-16: 1 mL via INTRAVENOUS
  Administered 2015-08-17 – 2015-08-20 (×8): via INTRAVENOUS

## 2015-08-15 MED ORDER — ONDANSETRON HCL 4 MG/2ML IJ SOLN
4.0000 mg | Freq: Once | INTRAMUSCULAR | Status: AC
  Administered 2015-08-15: 4 mg via INTRAVENOUS
  Filled 2015-08-15: qty 2

## 2015-08-15 MED ORDER — METRONIDAZOLE IN NACL 5-0.79 MG/ML-% IV SOLN
500.0000 mg | Freq: Once | INTRAVENOUS | Status: AC
  Administered 2015-08-15: 500 mg via INTRAVENOUS
  Filled 2015-08-15: qty 100

## 2015-08-15 MED ORDER — IOHEXOL 300 MG/ML  SOLN
100.0000 mL | Freq: Once | INTRAMUSCULAR | Status: AC | PRN
  Administered 2015-08-15: 100 mL via INTRAVENOUS

## 2015-08-15 MED ORDER — IOHEXOL 300 MG/ML  SOLN
25.0000 mL | Freq: Once | INTRAMUSCULAR | Status: DC | PRN
  Administered 2015-08-15: 25 mL via ORAL

## 2015-08-15 MED ORDER — ATENOLOL 50 MG PO TABS
50.0000 mg | ORAL_TABLET | Freq: Every day | ORAL | Status: DC
  Administered 2015-08-16 – 2015-08-17 (×2): 50 mg via ORAL
  Filled 2015-08-15 (×4): qty 1

## 2015-08-15 NOTE — H&P (Signed)
[name] is an 54 y.o. female.   Chief Complaint: abdominal pain HPI: 29 yof who has history of diverticulitis admitted to [facility] treated conservatively. She has since followed up and underwent colonoscopy by Dr [name]. She has the report from 8/16 that shows only some scattered diverticuli.  Three days ago developed epigastric and umbilical abdominal pain that has now progressed to the llq.  She had some pain with urination and having a bm. She has been having bms and flatus. She states she was having low grade fevers and some to 102.  She presents after undergoing ct that shows diverticular disease .  Past Medical History  Diagnosis Date  . Complication of anesthesia     difficult to wake up and blood pressure drops  . Heart murmur   . Hypertension     sees Dr. Montez Hester, is patients primary  . Dysrhythmia     no medication treatment  . Chronic bronchitis ([place])   . Pneumonia     hx  . Hypothyroidism     takes synthroid  . Anemia   . Headache(784.0)   . Arthritis   . Bursitis     hx of  . Mental disorder     bipolar  . Anxiety   . Depression   . Eczema   . GERD (gastroesophageal reflux disease)   . Diverticula of intestine     Past Surgical History  Procedure Laterality Date  . Cesarean section    . Tubal ligation    . Abdominal hysterectomy      has both of ovaries  . Colonoscopy    . Lumbar laminectomy  08/22/2011    Procedure: MICRODISCECTOMY LUMBAR LAMINECTOMY;  Surgeon: Brooke Shoals, MD;  Location: [facility];  Service: Neurosurgery;  Laterality: Right;  RIGHT Lumbar Five-Sacral One microdiskectomy    Family History  Problem Relation Age of Onset  . Anesthesia problems Mother   . Cancer Mother     thyroid  . Hypertension Mother   . Anesthesia problems Sister   . Hypertension Sister   . Cancer Maternal Aunt   . Cancer Maternal Aunt   . Colon cancer Sister   . Stroke Sister   . Hypertension Brother    Social History:  reports that she has been  smoking Cigarettes.  She has a 5 pack-year smoking history. She has never used smokeless tobacco. She reports that she does not drink alcohol or use illicit drugs.  Allergies:  Allergies  Allergen Reactions  . Benazepril Anaphylaxis, Hives and Itching  . Penicillins Hives    Has itching and some swelling in face  . Sulfa Antibiotics Hives    Itching and facial swelling  . Ciprofloxacin Hcl Hives    "funny sensation in gums"  . Clindamycin/Lincomycin Hives    Itching, welps  . Erythromycin Hives    itching  . Other     Pt has problems with being awaken from anesthesia, Her blood pressure drops very low  . Zithromax [Azithromycin] Hives    itching   meds reviewed  Results for orders placed or performed during the hospital encounter of 08/15/15 (from the past 48 hour(s))  Lipase, blood     Status: None   Collection Time: 08/15/15  4:03 PM  Result Value Ref Range   Lipase 23 11 - 51 U/L  Comprehensive metabolic panel     Status: Abnormal   Collection Time: 08/15/15  4:03 PM  Result Value Ref Range  Sodium 138 135 - 145 mmol/L   Potassium 3.8 3.5 - 5.1 mmol/L   Chloride 102 101 - 111 mmol/L   CO2 28 22 - 32 mmol/L   Glucose, Bld 109 (H) 65 - 99 mg/dL   BUN 7 6 - 20 mg/dL   Creatinine, Ser 0.95 0.44 - 1.00 mg/dL   Calcium 9.3 8.9 - 10.3 mg/dL   Total Protein 6.5 6.5 - 8.1 g/dL   Albumin 3.1 (L) 3.5 - 5.0 g/dL   AST 51 (H) 15 - 41 U/L   ALT 47 14 - 54 U/L   Alkaline Phosphatase 99 38 - 126 U/L   Total Bilirubin 0.6 0.3 - 1.2 mg/dL   GFR calc non Af Amer >60 >60 mL/min   GFR calc Af Amer >60 >60 mL/min    Comment: (NOTE) The eGFR has been calculated using the CKD EPI equation. This calculation has not been validated in all clinical situations. eGFR's persistently <60 mL/min signify possible Chronic Kidney Disease.    Anion gap 8 5 - 15  CBC     Status: None   Collection Time: 08/15/15  4:03 PM  Result Value Ref Range   WBC 10.5 4.0 - 10.5 K/uL   RBC 4.51 3.87 - 5.11  MIL/uL   Hemoglobin 12.8 12.0 - 15.0 g/dL   HCT 39.7 36.0 - 46.0 %   MCV 88.0 78.0 - 100.0 fL   MCH 28.4 26.0 - 34.0 pg   MCHC 32.2 30.0 - 36.0 g/dL   RDW 13.1 11.5 - 15.5 %   Platelets 236 150 - 400 K/uL  I-Stat CG4 Lactic Acid, ED     Status: None   Collection Time: 08/15/15  4:07 PM  Result Value Ref Range   Lactic Acid, Venous 0.80 0.5 - 2.0 mmol/L  Urinalysis, Routine w reflex microscopic (not at [facility])     Status: Abnormal   Collection Time: 08/15/15  5:05 PM  Result Value Ref Range   Color, Urine YELLOW YELLOW   APPearance CLEAR CLEAR   Specific Gravity, Urine 1.009 1.005 - 1.030   pH 6.0 5.0 - 8.0   Glucose, UA NEGATIVE NEGATIVE mg/dL   Hgb urine dipstick TRACE (A) NEGATIVE   Bilirubin Urine NEGATIVE NEGATIVE   Ketones, ur NEGATIVE NEGATIVE mg/dL   Protein, ur NEGATIVE NEGATIVE mg/dL   Nitrite NEGATIVE NEGATIVE   Leukocytes, UA NEGATIVE NEGATIVE  Urine microscopic-add on     Status: Abnormal   Collection Time: 08/15/15  5:05 PM  Result Value Ref Range   Squamous Epithelial / LPF 0-5 (A) NONE SEEN   WBC, UA NONE SEEN 0 - 5 WBC/hpf   RBC / HPF 0-5 0 - 5 RBC/hpf   Bacteria, UA NONE SEEN NONE SEEN   Ct Abdomen Pelvis W Contrast  08/15/2015  CLINICAL DATA:  Left lower quadrant abdominal pain starting Thursday. History of diverticulitis. EXAM: CT ABDOMEN AND PELVIS WITH CONTRAST TECHNIQUE: Multidetector CT imaging of the abdomen and pelvis was performed using the standard protocol following bolus administration of intravenous contrast. CONTRAST:  129m OMNIPAQUE IOHEXOL 300 MG/ML  SOLN COMPARISON:  12/24/2014 FINDINGS: Lower chest:  Minimal scarring in the lingula and right middle lobe. Hepatobiliary: Punctate calcification along the anterior dome of the left hepatic lobe, likely from remote inflammatory reaction. There is a fold causing narrowing in the central gallbladder very similar in appearance to 12/24/14, but probably incidental. No biliary dilatation or significant focal  hepatic lesion identified. Pancreas: Unremarkable Spleen: Unremarkable Adrenals/Urinary Tract: 5  mm hypodense lesion in the right mid kidney posteriorly, image 7 series 301, not appreciably changed from prior. This is statistically likely to be a cyst but technically nonspecific. Stomach/Bowel: There is sigmoid diverticulosis along with marked wall thickening in the distal sigmoid colon near the rectal junction, with abnormal density and fluid along the right side of the sigmoid colon, poor definition of fat planes, and questionable loculations of extraluminal gas. A loop of small bowel appears to be matted down immediately in this vicinity and confused is the appearance. There is abnormal adjacent presacral edema and edema tracking along the adjacent fat planes. I am suspicious for early abscess formation adjacent to the distal sigmoid, measuring about 2.0 by 1.2 cm on image 67 series 201. Possible additional abscess to the right of the rectosigmoid junction measuring about 2.7 by 1.4 cm on image 78 series 203, although very indistinct. There is previously wall thickening in this region but much less striking. The appendix appears normal. Vascular/Lymphatic: Small pelvic lymph nodes are not pathologically enlarged by size criteria. Reproductive: Uterus absent.  Ovaries unremarkable. Other: No supplemental non-categorized findings. Musculoskeletal: Sclerosis of the pubic bodies. Lower lumbar spondylosis noted causing mild left foraminal stenosis at L5-S1 and potentially at L4-5. IMPRESSION: 1. Marked wall thickening in the distal sigmoid colon with tiny locules of extraluminal gas suggesting local micro perforation, as well as vague ill-defined fluid densities along the or right wall of the distal sigmoid suspicious for early abscesses. There is extensive confluent stranding and poor definition of fat planes in this vicinity, and also presacral edema. This could represent recurrent sigmoid diverticulitis with  microperforation and early abscess formation. Please note that a distal sigmoid colon tumor with perforation is a differential diagnostic consideration and following or during treatment of the patient's current acute process, the possibility of malignancy should be investigated. 2. Other imaging findings of potential clinical significance: Mild osteitis pubis. Lower lumbar spondylosis causing mild left foraminal stenosis at L5-S1 and perhaps L4-5. Electronically Signed   By: Van Clines M.D.   On: 08/15/2015 19:06    Review of Systems  Constitutional: Positive for fever. Negative for chills.  Respiratory: Negative for cough and shortness of breath.   Cardiovascular: Negative for chest pain.  Gastrointestinal: Positive for abdominal pain. Negative for nausea, vomiting, diarrhea, constipation and blood in stool.  Genitourinary: Positive for dysuria.    Blood pressure 142/70, pulse 81, temperature 99 F (37.2 C), temperature source Oral, resp. rate 16, height 5' 7" (1.702 m), weight 91.173 kg (201 lb), SpO2 97 %. Physical Exam  Vitals reviewed. Constitutional: She is oriented to person, place, and time. She appears well-developed and well-nourished.  Eyes: No scleral icterus.  Neck: Neck supple.  Cardiovascular: Normal rate, regular rhythm and normal heart sounds.   Respiratory: Effort normal and breath sounds normal. She has no wheezes. She has no rales.  GI: Soft. There is tenderness (mild llq).  Neurological: She is alert and oriented to person, place, and time.     Assessment/Plan Diverticulitis  I dont think this is tumor due to recent colonoscopy. She does have nl wbc but on ct and exam appears to have diverticulitis.  Will start abx, has multiple allergies and will restart az/flagyl as last time.  Discussed conservative mgt  , 08/15/2015, 8:37 PM

## 2015-08-15 NOTE — Progress Notes (Signed)
ANTIBIOTIC CONSULT NOTE - INITIAL  Pharmacy Consult for Aztreonam Indication: Diverticulitis, perforation, abscess  Allergies  Allergen Reactions  . Benazepril Anaphylaxis, Hives and Itching  . Penicillins Hives    Has itching and some swelling in face  . Sulfa Antibiotics Hives    Itching and facial swelling  . Ciprofloxacin Hcl Hives    "funny sensation in gums"  . Clindamycin/Lincomycin Hives    Itching, welps  . Erythromycin Hives    itching  . Other     Pt has problems with being awaken from anesthesia, Her blood pressure drops very low  . Zithromax [Azithromycin] Hives    itching    Patient Measurements: Height: 5\' 7"  (170.2 cm) Weight: 201 lb (91.173 kg) IBW/kg (Calculated) : 61.6 Adjusted Body Weight:   Vital Signs: Temp: 99 F (37.2 C) (12/11 1516) Temp Source: Oral (12/11 1516) BP: 136/68 mmHg (12/11 1900) Pulse Rate: 77 (12/11 1900) Intake/Output from previous day:   Intake/Output from this shift:    Labs:  Recent Labs  08/15/15 1603  WBC 10.5  HGB 12.8  PLT 236  CREATININE 0.95   Estimated Creatinine Clearance: 77.5 mL/min (by C-G formula based on Cr of 0.95). No results for input(s): VANCOTROUGH, VANCOPEAK, VANCORANDOM, GENTTROUGH, GENTPEAK, GENTRANDOM, TOBRATROUGH, TOBRAPEAK, TOBRARND, AMIKACINPEAK, AMIKACINTROU, AMIKACIN in the last 72 hours.   Microbiology: No results found for this or any previous visit (from the past 720 hour(s)).  Medical History: Past Medical History  Diagnosis Date  . Complication of anesthesia     difficult to wake up and blood pressure drops  . Heart murmur   . Hypertension     sees Dr. Shana ChuteSpruill, is patients primary  . Dysrhythmia     no medication treatment  . Chronic bronchitis (HCC)   . Pneumonia     hx  . Hypothyroidism     takes synthroid  . Anemia   . Headache(784.0)   . Arthritis   . Bursitis     hx of  . Mental disorder     bipolar  . Anxiety   . Depression   . Eczema   . GERD  (gastroesophageal reflux disease)   . Diverticula of intestine     Medications:  Scheduled:   Assessment: 54yo female presenting with worsening LLQ abdominal pain.  She has had a fever to 102 & loose stools.  She also has R-lower dental pain with swelling in her jaw, though no noted abscess on exam.  She has  Metronidazole 500mg  IV x 1 also ordered.  CT abd (+)diverticulitis, microperforations, probable developing abscess Cr 0.95 WBC wnl  Goal of Therapy:  treatment of infection  Plan:  Aztreonam 2g IV q8 F/U any cx data Watch renal fxn, clinical status  Marisue HumbleKendra Janiece Scovill, PharmD Clinical Pharmacist [facility]

## 2015-08-15 NOTE — ED Notes (Signed)
Pt c/o abdominal pain, headache, and right lower toothache onset Thursday.

## 2015-08-15 NOTE — ED Provider Notes (Signed)
CSN: [id number]     Arrival date & time 08/15/15  1510 History   First MD Initiated Contact with Patient 08/15/15 1544     Chief Complaint  Patient presents with  . Abdominal Pain  . Headache  . Dental Pain     (Consider location/radiation/quality/duration/timing/severity/associated sxs/prior Treatment) HPI Comments: Patient presents to the ED with a chief complaint of LLQ abdominal pain.  She states that the symptoms have been gradually worsening for the past 3 days.  She reports prior hx of diverticulitis, and states that this pain feels worse.  She reports associated fever to 102.  States that she has had intermittent firm and loose stools.  She denies any blood in her stools.  She denies any associated nausea or vomiting.  She states that she does have some associated dysuria, but no hematuria, or vaginal discharge or bleeding.  Patient also complains of right lower dental pain.  She reports associated swelling in her jaw.  States that the pain radiates to her ear.  She has a history of dental problems.  There are no aggravating or alleviating factors.  The history is provided by the patient. No language interpreter was used.    Past Medical History  Diagnosis Date  . Complication of anesthesia     difficult to wake up and blood pressure drops  . Heart murmur   . Hypertension     sees Dr. Shana Chute, is patients primary  . Dysrhythmia     no medication treatment  . Chronic bronchitis (HCC)   . Pneumonia     hx  . Hypothyroidism     takes synthroid  . Anemia   . Headache(784.0)   . Arthritis   . Bursitis     hx of  . Mental disorder     bipolar  . Anxiety   . Depression   . Eczema   . GERD (gastroesophageal reflux disease)   . Diverticula of intestine    Past Surgical History  Procedure Laterality Date  . Cesarean section    . Tubal ligation    . Abdominal hysterectomy      has both of ovaries  . Colonoscopy    . Lumbar laminectomy  08/22/2011    Procedure:  MICRODISCECTOMY LUMBAR LAMINECTOMY;  Surgeon: Dorian Heckle, MD;  Location: [facility];  Service: Neurosurgery;  Laterality: Right;  RIGHT Lumbar Five-Sacral One microdiskectomy   Family History  Problem Relation Age of Onset  . Anesthesia problems Mother   . Cancer Mother     thyroid  . Hypertension Mother   . Anesthesia problems Sister   . Hypertension Sister   . Cancer Maternal Aunt   . Cancer Maternal Aunt   . Colon cancer Sister   . Stroke Sister   . Hypertension Brother    Social History  Substance Use Topics  . Smoking status: Current Every Day Smoker -- 0.25 packs/day for 20 years    Types: Cigarettes  . Smokeless tobacco: Never Used  . Alcohol Use: No     Comment: occ   OB History    No data available     Review of Systems  Constitutional: Negative for fever and chills.  HENT: Positive for dental problem. Negative for drooling.   Respiratory: Negative for shortness of breath.   Cardiovascular: Negative for chest pain.  Gastrointestinal: Positive for abdominal pain. Negative for nausea, vomiting, diarrhea and constipation.  Genitourinary: Negative for dysuria.  Neurological: Negative for speech difficulty.  Psychiatric/Behavioral: Positive  for sleep disturbance.  All other systems reviewed and are negative.     Allergies  Benazepril; Penicillins; Sulfa antibiotics; Ciprofloxacin hcl; Clindamycin/lincomycin; Erythromycin; Other; and Zithromax  Home Medications   Prior to Admission medications   Medication Sig Start Date End Date Taking? Authorizing Provider  albuterol (PROVENTIL HFA;VENTOLIN HFA) 108 (90 BASE) MCG/ACT inhaler Inhale 2 puffs into the lungs every 4 (four) hours as needed for wheezing or shortness of breath. 08/02/13  Yes Abigail Harris, PA-C  amLODipine (NORVASC) 10 MG tablet Take 1 tablet (10 mg total) by mouth daily. Take this instead of your LOTREL until you see your regular doctor. 08/09/15  Yes Quentin Angst, MD  atenolol  (TENORMIN) 50 MG tablet TAKE ONE TABLET   BY MOUTH   DAILY 05/20/15  Yes Quentin Angst, MD  clorazepate (TRANXENE) 7.5 MG tablet Take 7.5 mg by mouth 2 (two) times daily.     Yes Historical Provider, MD  cyclobenzaprine (FLEXERIL) 10 MG tablet Take 1 tablet (10 mg total) by mouth at bedtime. 01/29/15  Yes Doris Cheadle, MD  diphenhydrAMINE (BENADRYL) 25 MG tablet Take 1 tablet (25 mg total) by mouth every 6 (six) hours. 04/03/14  Yes Mercedes Camprubi-Soms, PA-C  docusate sodium (COLACE) 100 MG capsule Take 100 mg by mouth daily.    Yes Historical Provider, MD  hydrocortisone cream 1 % Apply 1 application topically 2 (two) times daily. 12/09/14  Yes Doris Cheadle, MD  levothyroxine (SYNTHROID, LEVOTHROID) 125 MCG tablet Take 125 mcg by mouth daily.     Yes Historical Provider, MD  QUEtiapine (SEROQUEL XR) 50 MG TB24 Take 50 mg by mouth at bedtime.    Yes Historical Provider, MD  triamcinolone ointment (KENALOG) 0.1 % Apply 1 application topically daily. All over.   Yes Historical Provider, MD   BP 119/71 mmHg  Pulse 84  Temp(Src) 99 F (37.2 C) (Oral)  Resp 16  Ht 5\' 7"  (1.702 m)  Wt 91.173 kg  BMI 31.47 kg/m2  SpO2 99% Physical Exam  Constitutional: She is oriented to person, place, and time. She appears well-developed and well-nourished.  HENT:  Head: Normocephalic and atraumatic.  Mouth/Throat:    Poor dentition throughout.  Affected tooth as diagrammed.  No signs of peritonsillar or tonsillar abscess.  No signs of gingival abscess. Oropharynx is clear and without exudates.  Uvula is midline.  Airway is intact. No signs of Ludwig's angina with palpation of oral and sublingual mucosa.   Eyes: Conjunctivae and EOM are normal. Pupils are equal, round, and reactive to light.  Neck: Normal range of motion. Neck supple.  Cardiovascular: Normal rate and regular rhythm.  Exam reveals no gallop and no friction rub.   No murmur heard. Pulmonary/Chest: Effort normal and breath sounds normal.  No respiratory distress. She has no wheezes. She has no rales. She exhibits no tenderness.  CTAB  Abdominal: Soft. Bowel sounds are normal. She exhibits no distension and no mass. There is tenderness. There is guarding. There is no rebound.  LLQ tender to palpation Pain at LLQ with palpation of RLQ and upper quadrants  Musculoskeletal: Normal range of motion. She exhibits no edema or tenderness.  Neurological: She is alert and oriented to person, place, and time.  Skin: Skin is warm and dry.  Psychiatric: She has a normal mood and affect. Her behavior is normal. Judgment and thought content normal.  Nursing note and vitals reviewed.   ED Course  Procedures (including critical care time) Results for orders placed or  performed during the hospital encounter of 08/15/15  Lipase, blood  Result Value Ref Range   Lipase 23 11 - 51 U/L  Comprehensive metabolic panel  Result Value Ref Range   Sodium 138 135 - 145 mmol/L   Potassium 3.8 3.5 - 5.1 mmol/L   Chloride 102 101 - 111 mmol/L   CO2 28 22 - 32 mmol/L   Glucose, Bld 109 (H) 65 - 99 mg/dL   BUN 7 6 - 20 mg/dL   Creatinine, Ser 1.61 0.44 - 1.00 mg/dL   Calcium 9.3 8.9 - 09.6 mg/dL   Total Protein 6.5 6.5 - 8.1 g/dL   Albumin 3.1 (L) 3.5 - 5.0 g/dL   AST 51 (H) 15 - 41 U/L   ALT 47 14 - 54 U/L   Alkaline Phosphatase 99 38 - 126 U/L   Total Bilirubin 0.6 0.3 - 1.2 mg/dL   GFR calc non Af Amer >60 >60 mL/min   GFR calc Af Amer >60 >60 mL/min   Anion gap 8 5 - 15  CBC  Result Value Ref Range   WBC 10.5 4.0 - 10.5 K/uL   RBC 4.51 3.87 - 5.11 MIL/uL   Hemoglobin 12.8 12.0 - 15.0 g/dL   HCT 04.5 40.9 - 81.1 %   MCV 88.0 78.0 - 100.0 fL   MCH 28.4 26.0 - 34.0 pg   MCHC 32.2 30.0 - 36.0 g/dL   RDW 91.4 78.2 - 95.6 %   Platelets 236 150 - 400 K/uL  Urinalysis, Routine w reflex microscopic (not at [facility])  Result Value Ref Range   Color, Urine YELLOW YELLOW   APPearance CLEAR CLEAR   Specific Gravity, Urine 1.009 1.005 - 1.030   pH  6.0 5.0 - 8.0   Glucose, UA NEGATIVE NEGATIVE mg/dL   Hgb urine dipstick TRACE (A) NEGATIVE   Bilirubin Urine NEGATIVE NEGATIVE   Ketones, ur NEGATIVE NEGATIVE mg/dL   Protein, ur NEGATIVE NEGATIVE mg/dL   Nitrite NEGATIVE NEGATIVE   Leukocytes, UA NEGATIVE NEGATIVE  Urine microscopic-add on  Result Value Ref Range   Squamous Epithelial / LPF 0-5 (A) NONE SEEN   WBC, UA NONE SEEN 0 - 5 WBC/hpf   RBC / HPF 0-5 0 - 5 RBC/hpf   Bacteria, UA NONE SEEN NONE SEEN  I-Stat CG4 Lactic Acid, ED  Result Value Ref Range   Lactic Acid, Venous 0.80 0.5 - 2.0 mmol/L   Ct Abdomen Pelvis W Contrast  08/15/2015  CLINICAL DATA:  Left lower quadrant abdominal pain starting Thursday. History of diverticulitis. EXAM: CT ABDOMEN AND PELVIS WITH CONTRAST TECHNIQUE: Multidetector CT imaging of the abdomen and pelvis was performed using the standard protocol following bolus administration of intravenous contrast. CONTRAST:  OMNIPAQUE IOHEXOL 300 MG/ML  SOLN COMPARISON:  12/24/2014 FINDINGS: Lower chest:  Minimal scarring in the lingula and right middle lobe. Hepatobiliary: Punctate calcification along the anterior dome of the left hepatic lobe, likely from remote inflammatory reaction. There is a fold causing narrowing in the central gallbladder very similar in appearance to 12/24/14, but probably incidental. No biliary dilatation or significant focal hepatic lesion identified. Pancreas: Unremarkable Spleen: Unremarkable Adrenals/Urinary Tract: 5 mm hypodense lesion in the right mid kidney posteriorly, image 7 series 301, not appreciably changed from prior. This is statistically likely to be a cyst but technically nonspecific. Stomach/Bowel: There is sigmoid diverticulosis along with marked wall thickening in the distal sigmoid colon near the rectal junction, with abnormal density and fluid along the right side of  the sigmoid colon, poor definition of fat planes, and questionable loculations of extraluminal gas. A  loop of small bowel appears to be matted down immediately in this vicinity and confused is the appearance. There is abnormal adjacent presacral edema and edema tracking along the adjacent fat planes. I am suspicious for early abscess formation adjacent to the distal sigmoid, measuring about 2.0 by 1.2 cm on image 67 series 201. Possible additional abscess to the right of the rectosigmoid junction measuring about 2.7 by 1.4 cm on image 78 series 203, although very indistinct. There is previously wall thickening in this region but much less striking. The appendix appears normal. Vascular/Lymphatic: Small pelvic lymph nodes are not pathologically enlarged by size criteria. Reproductive: Uterus absent.  Ovaries unremarkable. Other: No supplemental non-categorized findings. Musculoskeletal: Sclerosis of the pubic bodies. Lower lumbar spondylosis noted causing mild left foraminal stenosis at L5-S1 and potentially at L4-5. IMPRESSION: 1. Marked wall thickening in the distal sigmoid colon with tiny locules of extraluminal gas suggesting local micro perforation, as well as vague ill-defined fluid densities along the or right wall of the distal sigmoid suspicious for early abscesses. There is extensive confluent stranding and poor definition of fat planes in this vicinity, and also presacral edema. This could represent recurrent sigmoid diverticulitis with microperforation and early abscess formation. Please note that a distal sigmoid colon tumor with perforation is a differential diagnostic consideration and following or during treatment of the patient's current acute process, the possibility of malignancy should be investigated. 2. Other imaging findings of potential clinical significance: Mild osteitis pubis. Lower lumbar spondylosis causing mild left foraminal stenosis at L5-S1 and perhaps L4-5. Electronically Signed   By: Gaylyn RongWalter  Liebkemann M.D.   On: 08/15/2015 19:06    I have personally reviewed and evaluated these  images and lab results as part of my medical decision-making.   EKG Interpretation None      MDM   Final diagnoses:  Diverticulitis of large intestine with abscess     Patient with abdominal pain and dental pain.  Likely unrelated.    Abdominal pain is significant in LLQ.  Concern for diverticulitis.  Hx of the same.  Associated fevers and loose stools.  Will check labs and CT.  Dental pain will need abx and dental follow-up.  7:15 PM CT remarkable for probable diverticulitis with microperforations and probable developing abscess.  Discussed with Dr. Anitra LauthPlunkett.  Will consult surgery.    Prior hospitalization from March reviewed.  Last time patient had this problem she was placed on Aztreonam and Flagyl.  Will repeat this today because of her extensive allergy list.  7:57 PM Appreciate consult from Dr. Dwain SarnaWakefield, who tells me that he will be by to admit the patient.     CRITICAL CARE Diverticulitis with microperforations and small free air. Performed by: Roxy HorsemanBROWNING, Carnita Golob   Total critical care time: 45 minutes  Critical care time was exclusive of separately billable procedures and treating other patients.  Critical care was necessary to treat or prevent imminent or life-threatening deterioration.  Critical care was time spent personally by me on the following activities: development of treatment plan with patient and/or surrogate as well as nursing, discussions with consultants, evaluation of patient's response to treatment, examination of patient, obtaining history from patient or surrogate, ordering and performing treatments and interventions, ordering and review of laboratory studies, ordering and review of radiographic studies, pulse oximetry and re-evaluation of patient's condition.    Roxy HorsemanRobert Tyffani Foglesong, PA-C 08/15/15 1958  Gwyneth SproutWhitney Plunkett, MD  08/16/15 0015 

## 2015-08-15 NOTE — ED Notes (Signed)
Report attempted 

## 2015-08-16 LAB — CBC
HCT: 37.7 % (ref 36.0–46.0)
Hemoglobin: 12.1 g/dL (ref 12.0–15.0)
MCH: 28.5 pg (ref 26.0–34.0)
MCHC: 32.1 g/dL (ref 30.0–36.0)
MCV: 88.9 fL (ref 78.0–100.0)
Platelets: 252 10*3/uL (ref 150–400)
RBC: 4.24 MIL/uL (ref 3.87–5.11)
RDW: 13.2 % (ref 11.5–15.5)
WBC: 8.8 10*3/uL (ref 4.0–10.5)

## 2015-08-16 LAB — BASIC METABOLIC PANEL
Anion gap: 7 (ref 5–15)
BUN: <5 mg/dL — ABNORMAL LOW (ref 6–20)
CO2: 30 mmol/L (ref 22–32)
Calcium: 8.8 mg/dL — ABNORMAL LOW (ref 8.9–10.3)
Chloride: 100 mmol/L — ABNORMAL LOW (ref 101–111)
Creatinine, Ser: 0.84 mg/dL (ref 0.44–1.00)
GFR calc Af Amer: >60 mL/min (ref >60)
GFR calc non Af Amer: >60 mL/min (ref >60)
Glucose, Bld: 101 mg/dL — ABNORMAL HIGH (ref 65–99)
Potassium: 3.9 mmol/L (ref 3.5–5.1)
Sodium: 137 mmol/L (ref 135–145)

## 2015-08-16 NOTE — Care Management Note (Signed)
Case Management Note  Patient Details  Name: [name] MRN: [id number] Date of Birth: 11/25/1960  Subjective/Objective:                    Action/Plan:  Initial UR completed  Expected Discharge Date:                  Expected Discharge Plan:  Home/Self Care  In-House Referral:     Discharge planning Services     Post Acute Care Choice:    Choice offered to:     DME Arranged:    DME Agency:     HH Arranged:    HH Agency:     Status of Service:  In process, will continue to follow  Medicare Important Message Given:    Date Medicare IM Given:    Medicare IM give by:    Date Additional Medicare IM Given:    Additional Medicare Important Message give by:     If discussed at Long Length of Stay Meetings, dates discussed:    Additional Comments:  Kingsley PlanWile, Demir Titsworth Marie, RN 08/16/2015, 11:01 AM

## 2015-08-16 NOTE — Progress Notes (Signed)
Subjective: No real complaints, no abdominal pain this AM.  Her mouth is swollen and she has a dental abscess that has been present for 2-3 days.  Right lower around what looks like # 27 or 28.   She currently has a low grade temp 100.1 at exam. Objective: Vital signs in last 24 hours: Temp:  [99 F (37.2 C)-100.5 F (38.1 C)] 99.3 F (37.4 C) (12/12 0549) Pulse Rate:  [66-85] 76 (12/12 0549) Resp:  [16-18] 17 (12/12 0549) BP: (119-146)/(60-84) 124/68 mmHg (12/12 0549) SpO2:  [92 %-100 %] 94 % (12/12 0549) Weight:  [89.812 kg (198 lb)-91.173 kg (201 lb)] 89.812 kg (198 lb) (12/11 2204) Last BM Date: 08/15/15 PO 30 ml recorded  NPO except sips with meds TM 100.5, VSS BMP normal/CBC shows normal WBC CT scan 08/15/15:  Marland Kitchen Marked wall thickening in the distal sigmoid colon with tiny locules of extraluminal gas suggesting local micro perforation, as well as vague ill-defined fluid densities along the or right wall of the distal sigmoid suspicious for early abscesses. There is extensive confluent stranding and poor definition of fat planes in this vicinity, and also presacral edema. This could represent recurrent sigmoid diverticulitis with microperforation and early abscess formation. ) 2.0 by 1.2 cm in the distal sigmoid, possible additional abscess to the right of the rectosigmoid junction measuring about 2.7 by 1.4 cm)  Tumor was also in differential diagnosis from Radiology. Intake/Output from previous day: 12/11 0701 - 12/12 0700 In: 1003.3 [P.O.:30; I.V.:773.3; IV Piggyback:200] Out: -  Intake/Output this shift:    General appearance: alert, cooperative, no distress and not having any abdominal pain, she has facial swelling on the right with abscess tooth 27 or 28 ( i think) GI: soft, not tender few BS.    Lab Results:   Recent Labs  08/15/15 1603 08/16/15 0557  WBC 10.5 8.8  HGB 12.8 12.1  HCT 39.7 37.7  PLT 236 252    BMET  Recent Labs  08/15/15 1603  08/16/15 0557  NA 138 137  K 3.8 3.9  CL 102 100*  CO2 28 30  GLUCOSE 109* 101*  BUN 7 <5*  CREATININE 0.95 0.84  CALCIUM 9.3 8.8*   PT/INR No results for input(s): LABPROT, INR in the last 72 hours.   Recent Labs Lab 08/15/15 1603  AST 51*  ALT 47  ALKPHOS 99  BILITOT 0.6  PROT 6.5  ALBUMIN 3.1*     Lipase     Component Value Date/Time   LIPASE 23 08/15/2015 1603     Studies/Results: Ct Abdomen Pelvis W Contrast  08/15/2015  CLINICAL DATA:  Left lower quadrant abdominal pain starting Thursday. History of diverticulitis. EXAM: CT ABDOMEN AND PELVIS WITH CONTRAST TECHNIQUE: Multidetector CT imaging of the abdomen and pelvis was performed using the standard protocol following bolus administration of intravenous contrast. CONTRAST:  OMNIPAQUE IOHEXOL 300 MG/ML  SOLN COMPARISON:  12/24/2014 FINDINGS: Lower chest:  Minimal scarring in the lingula and right middle lobe. Hepatobiliary: Punctate calcification along the anterior dome of the left hepatic lobe, likely from remote inflammatory reaction. There is a fold causing narrowing in the central gallbladder very similar in appearance to 12/24/14, but probably incidental. No biliary dilatation or significant focal hepatic lesion identified. Pancreas: Unremarkable Spleen: Unremarkable Adrenals/Urinary Tract: 5 mm hypodense lesion in the right mid kidney posteriorly, image 7 series 301, not appreciably changed from prior. This is statistically likely to be a cyst but technically nonspecific. Stomach/Bowel: There is sigmoid diverticulosis along  with marked wall thickening in the distal sigmoid colon near the rectal junction, with abnormal density and fluid along the right side of the sigmoid colon, poor definition of fat planes, and questionable loculations of extraluminal gas. A loop of small bowel appears to be matted down immediately in this vicinity and confused is the appearance. There is abnormal adjacent presacral edema and edema  tracking along the adjacent fat planes. I am suspicious for early abscess formation adjacent to the distal sigmoid, measuring about 2.0 by 1.2 cm on image 67 series 201. Possible additional abscess to the right of the rectosigmoid junction measuring about 2.7 by 1.4 cm on image 78 series 203, although very indistinct. There is previously wall thickening in this region but much less striking. The appendix appears normal. Vascular/Lymphatic: Small pelvic lymph nodes are not pathologically enlarged by size criteria. Reproductive: Uterus absent.  Ovaries unremarkable. Other: No supplemental non-categorized findings. Musculoskeletal: Sclerosis of the pubic bodies. Lower lumbar spondylosis noted causing mild left foraminal stenosis at L5-S1 and potentially at L4-5. IMPRESSION: 1. Marked wall thickening in the distal sigmoid colon with tiny locules of extraluminal gas suggesting local micro perforation, as well as vague ill-defined fluid densities along the or right wall of the distal sigmoid suspicious for early abscesses. There is extensive confluent stranding and poor definition of fat planes in this vicinity, and also presacral edema. This could represent recurrent sigmoid diverticulitis with microperforation and early abscess formation. Please note that a distal sigmoid colon tumor with perforation is a differential diagnostic consideration and following or during treatment of the patient's current acute process, the possibility of malignancy should be investigated. 2. Other imaging findings of potential clinical significance: Mild osteitis pubis. Lower lumbar spondylosis causing mild left foraminal stenosis at L5-S1 and perhaps L4-5. Electronically Signed   By: Gaylyn RongWalter  Liebkemann M.D.   On: 08/15/2015 19:06    Medications: . amLODipine  10 mg Oral Daily  . atenolol  50 mg Oral Daily  . aztreonam  2 g Intravenous 3 times per day  . clorazepate  7.5 mg Oral BID  . enoxaparin (LOVENOX) injection  40 mg  Subcutaneous Q24H  . levothyroxine  125 mcg Oral QAC breakfast  . metronidazole  500 mg Intravenous Q8H  . QUEtiapine  50 mg Oral QHS   . sodium chloride 1 mL (08/16/15 [id number])   Prior to Admission medications   Medication Sig Start Date End Date Taking? Authorizing Provider  albuterol (PROVENTIL HFA;VENTOLIN HFA) 108 (90 BASE) MCG/ACT inhaler Inhale 2 puffs into the lungs every 4 (four) hours as needed for wheezing or shortness of breath. 08/02/13  Yes Abigail Harris, PA-C  amLODipine (NORVASC) 10 MG tablet Take 1 tablet (10 mg total) by mouth daily. Take this instead of your LOTREL until you see your regular doctor. 08/09/15  Yes Quentin Angstlugbemiga E Jegede, MD  atenolol (TENORMIN) 50 MG tablet TAKE ONE TABLET   BY MOUTH   DAILY 05/20/15  Yes Quentin Angstlugbemiga E Jegede, MD  clorazepate (TRANXENE) 7.5 MG tablet Take 7.5 mg by mouth 2 (two) times daily.     Yes Historical Provider, MD  cyclobenzaprine (FLEXERIL) 10 MG tablet Take 1 tablet (10 mg total) by mouth at bedtime. 01/29/15  Yes Doris Cheadleeepak Advani, MD  diphenhydrAMINE (BENADRYL) 25 MG tablet Take 1 tablet (25 mg total) by mouth every 6 (six) hours. 04/03/14  Yes Mercedes Camprubi-Soms, PA-C  docusate sodium (COLACE) 100 MG capsule Take 100 mg by mouth daily.    Yes Historical Provider, MD  hydrocortisone cream 1 % Apply 1 application topically 2 (two) times daily. 12/09/14  Yes Doris Cheadle, MD  levothyroxine (SYNTHROID, LEVOTHROID) 125 MCG tablet Take 125 mcg by mouth daily.     Yes Historical Provider, MD  QUEtiapine (SEROQUEL XR) 50 MG TB24 Take 50 mg by mouth at bedtime.    Yes Historical Provider, MD  triamcinolone ointment (KENALOG) 0.1 % Apply 1 application topically daily. All over.   Yes Historical Provider, MD     Assessment/Plan 1.Recurrent diverticulitis, hospitalized 3/15-24/2016, abscess to small to drain 2. Hypertension 3. Bipolar disorder/Anxiety/Depression 4. GERD 5. Hypothyroid 6. Chronic back pain 7. Multiple Medicine allergies (5  are antibiotics) 8. Chronic bronchitis/ongoing tobacco use/hx of pneumonia 9.  Multiple allergies (penicillin, Sulfa drugs, Cipro, Clindamycin, Erythromycin, Zithromax) 10.  Current antibiotics:  Day 2 Aztreonam/ Flagyl 11.  DVT:  Lovenox/SCD    Plan:  She is on day 2 of antibiotics, continue these. I will start her on some sips of clears from the floor and see if we can get her up to a clear diet.  I will check on dental consult.    LOS: 1 day    Talik Casique 08/16/2015

## 2015-08-17 ENCOUNTER — Inpatient Hospital Stay (HOSPITAL_COMMUNITY): Payer: Medicaid Other

## 2015-08-17 ENCOUNTER — Encounter (HOSPITAL_COMMUNITY): Payer: Self-pay | Admitting: Dentistry

## 2015-08-17 DIAGNOSIS — K047 Periapical abscess without sinus: Secondary | ICD-10-CM

## 2015-08-17 DIAGNOSIS — K0530 Chronic periodontitis, unspecified: Secondary | ICD-10-CM

## 2015-08-17 DIAGNOSIS — K083 Retained dental root: Secondary | ICD-10-CM

## 2015-08-17 DIAGNOSIS — K029 Dental caries, unspecified: Secondary | ICD-10-CM

## 2015-08-17 DIAGNOSIS — M264 Malocclusion, unspecified: Secondary | ICD-10-CM

## 2015-08-17 DIAGNOSIS — K036 Deposits [accretions] on teeth: Secondary | ICD-10-CM

## 2015-08-17 DIAGNOSIS — K0401 Reversible pulpitis: Secondary | ICD-10-CM

## 2015-08-17 DIAGNOSIS — K08409 Partial loss of teeth, unspecified cause, unspecified class: Secondary | ICD-10-CM

## 2015-08-17 DIAGNOSIS — K060 Gingival recession: Secondary | ICD-10-CM

## 2015-08-17 DIAGNOSIS — K045 Chronic apical periodontitis: Secondary | ICD-10-CM

## 2015-08-17 LAB — CBC
HCT: 38.4 % (ref 36.0–46.0)
Hemoglobin: 11.7 g/dL — ABNORMAL LOW (ref 12.0–15.0)
MCH: 27.5 pg (ref 26.0–34.0)
MCHC: 30.5 g/dL (ref 30.0–36.0)
MCV: 90.1 fL (ref 78.0–100.0)
Platelets: 239 10*3/uL (ref 150–400)
RBC: 4.26 MIL/uL (ref 3.87–5.11)
RDW: 13.3 % (ref 11.5–15.5)
WBC: 9.5 10*3/uL (ref 4.0–10.5)

## 2015-08-17 MED ORDER — LEVOFLOXACIN 750 MG PO TABS
750.0000 mg | ORAL_TABLET | Freq: Every day | ORAL | Status: DC
Start: 2015-08-17 — End: 2015-08-22
  Administered 2015-08-17 – 2015-08-21 (×4): 750 mg via ORAL
  Filled 2015-08-17 (×4): qty 1

## 2015-08-17 MED ORDER — SACCHAROMYCES BOULARDII 250 MG PO CAPS
250.0000 mg | ORAL_CAPSULE | Freq: Two times a day (BID) | ORAL | Status: DC
  Administered 2015-08-17 – 2015-08-18 (×2): 250 mg via ORAL
  Filled 2015-08-17 (×2): qty 1

## 2015-08-17 MED ORDER — CHLORHEXIDINE GLUCONATE 0.12 % MT SOLN
15.0000 mL | Freq: Four times a day (QID) | OROMUCOSAL | Status: DC
  Administered 2015-08-17 – 2015-08-19 (×11): 15 mL via OROMUCOSAL
  Filled 2015-08-17 (×11): qty 15

## 2015-08-17 NOTE — Consult Note (Signed)
DENTAL CONSULTATION  Date of Consultation:  08/17/2015 Patient Name:   [name] Date of Birth:   [birth year] Medical Record Number: [id number]  VITALS: BP 114/57 mmHg  Pulse 77  Temp(Src) 99.7 F (37.6 C) (Oral)  Resp 17  Ht  (1.702 m)  Wt 198 lb (89.812 kg)  BMI 31.00 kg/m2  SpO2 97%  CHIEF COMPLAINT: Patient referred for evaluation of right facial swelling.  HPI: [name] is a 54 year old female recently admitted for diverticulitis. Patient is on antibiotic therapy per [facility]. Patient was recently found to have right facial swelling. A dental consultation was requested to evaluate the right facial swelling and provide treatment as indicated.  Patient gives a history of toothache and facial swelling starting approximately 2 to 3 days ago.  Patient has been experiencing sharp, spontaneous pain that reached an intensity of 10 out of 10. Pain is currently 0 out of 10 by patient report with the IV antibiotic therapy and pain management.  Pain is worse with chewing onn the right side of her mouth. The patient last saw a dentist approximately one year ago. Patient was seen for an exam with the plan for multiple extractions. Patient did not proceed with dental treatment at that time. The patient denies having partial dentures.  PROBLEM LIST: Patient Active Problem List   Diagnosis Date Noted  . Diverticulitis 08/15/2015  . Diverticulitis of large intestine with abscess  11/23/2014  . Acute diverticulitis 11/23/2014  . Leukocytosis 11/17/2014  . Essential hypertension 11/17/2014  . Heart murmur   . Chronic bronchitis (HCC)   . Hypothyroidism   . Anxiety   . Depression   . Mental disorder   . GERD (gastroesophageal reflux disease)   . Simple chronic bronchitis (HCC)     PMH: Past Medical History  Diagnosis Date  . Complication of anesthesia     difficult to wake up and blood pressure drops  . Heart murmur   . Hypertension     sees Dr.  Shana Chute, is patients primary  . Dysrhythmia     no medication treatment  . Chronic bronchitis (HCC)   . Pneumonia     hx  . Hypothyroidism     takes synthroid  . Anemia   . Headache(784.0)   . Arthritis   . Bursitis     hx of  . Mental disorder     bipolar  . Anxiety   . Depression   . Eczema   . GERD (gastroesophageal reflux disease)   . Diverticula of intestine     PSH: Past Surgical History  Procedure Laterality Date  . Cesarean section    . Tubal ligation    . Abdominal hysterectomy      has both of ovaries  . Colonoscopy    . Lumbar laminectomy  08/22/2011    Procedure: MICRODISCECTOMY LUMBAR LAMINECTOMY;  Surgeon: Dorian Heckle, MD;  Location: [facility];  Service: Neurosurgery;  Laterality: Right;  RIGHT Lumbar Five-Sacral One microdiskectomy    ALLERGIES: Allergies  Allergen Reactions  . Benazepril Anaphylaxis, Hives and Itching  . Penicillins Hives    Has itching and some swelling in face  . Sulfa Antibiotics Hives    Itching and facial swelling  . Ciprofloxacin Hcl Hives    "funny sensation in gums"  . Clindamycin/Lincomycin Hives    Itching, welps  . Erythromycin Hives    itching  . Other     Pt has problems with being awaken from  anesthesia, Her blood pressure drops very low  . Zithromax [Azithromycin] Hives    itching    MEDICATIONS: Current Facility-Administered Medications  Medication Dose Route Frequency Provider Last Rate Last Dose  . 0.9 %  sodium chloride infusion   Intravenous Continuous Emelia Loron, MD 100 mL/hr at 08/17/15 [phone]    . acetaminophen (TYLENOL) tablet 650 mg  650 mg Oral Q6H PRN Emelia Loron, MD   650 mg at 08/16/15 2244   Or  . acetaminophen (TYLENOL) suppository 650 mg  650 mg Rectal Q6H PRN Emelia Loron, MD      . albuterol (PROVENTIL) (2.5 MG/3ML) 0.083% nebulizer solution 2.5 mg  2.5 mg Nebulization Q4H PRN Almon Hercules, RPH      . amLODipine (NORVASC) tablet 10 mg  10 mg Oral Daily Emelia Loron, MD   10 mg at 08/17/15 0934  . atenolol (TENORMIN) tablet 50 mg  50 mg Oral Daily Emelia Loron, MD   50 mg at 08/17/15 0934  . aztreonam (AZACTAM) 2 g in dextrose 5 % 50 mL IVPB  2 g Intravenous 3 times per day Renaee Munda, RPH   2 g at 08/17/15 0545  . chlorhexidine (PERIDEX) 0.12 % solution 15 mL  15 mL Mouth/Throat QID Nonie Hoyer, PA-C   15 mL at 08/17/15 1136  . clorazepate (TRANXENE) tablet 7.5 mg  7.5 mg Oral BID Emelia Loron, MD   7.5 mg at 08/17/15 0934  . enoxaparin (LOVENOX) injection 40 mg  40 mg Subcutaneous Q24H Emelia Loron, MD   40 mg at 08/17/15 0934  . HYDROmorphone (DILAUDID) injection 1 mg  1 mg Intravenous Q2H PRN Emelia Loron, MD   1 mg at 08/17/15 0316  . iohexol (OMNIPAQUE) 300 MG/ML solution 25 mL  25 mL Oral Once PRN Medication Radiologist, MD   25 mL at 08/15/15 1638  . levofloxacin (LEVAQUIN) tablet 750 mg  750 mg Oral Daily Nonie Hoyer, PA-C   750 mg at 08/17/15 1141  . levothyroxine (SYNTHROID, LEVOTHROID) tablet 125 mcg  125 mcg Oral QAC breakfast Emelia Loron, MD   125 mcg at 08/17/15 [phone]  . metroNIDAZOLE (FLAGYL) IVPB 500 mg  500 mg Intravenous Q8H Emelia Loron, MD   500 mg at 08/17/15 1136  . ondansetron (ZOFRAN-ODT) disintegrating tablet 4 mg  4 mg Oral Q6H PRN Emelia Loron, MD       Or  . ondansetron [facility]) injection 4 mg  4 mg Intravenous Q6H PRN Emelia Loron, MD      . QUEtiapine (SEROQUEL XR) 24 hr tablet 50 mg  50 mg Oral QHS Emelia Loron, MD   50 mg at 08/16/15 2143  . zolpidem (AMBIEN) tablet 5 mg  5 mg Oral QHS PRN Emelia Loron, MD        LABS: Lab Results  Component Value Date   WBC 9.5 08/17/2015   HGB 11.7* 08/17/2015   HCT 38.4 08/17/2015   MCV 90.1 08/17/2015   PLT 239 08/17/2015      Component Value Date/Time   NA 137 08/16/2015 0557   K 3.9 08/16/2015 0557   CL 100* 08/16/2015 0557   CO2 30 08/16/2015 0557   GLUCOSE 101* 08/16/2015 0557   BUN <5* 08/16/2015 0557    CREATININE 0.84 08/16/2015 0557   CREATININE 0.72 01/29/2015 1216   CALCIUM 8.8* 08/16/2015 0557   GFRNONAA >60 08/16/2015 0557   GFRNONAA >89 01/29/2015 1216   GFRAA >60 08/16/2015 0557   GFRAA >89 01/29/2015 1216  Lab Results  Component Value Date   INR 1.10 11/24/2014   No results found for: PTT  SOCIAL HISTORY: Social History   Social History  . Marital Status: Single    Spouse Name: N/A  . Number of Children: N/A  . Years of Education: N/A   Occupational History  . Not on file.   Social History Main Topics  . Smoking status: Current Every Day Smoker -- 0.25 packs/day for 20 years    Types: Cigarettes  . Smokeless tobacco: Never Used  . Alcohol Use: No     Comment: occ  . Drug Use: No  . Sexual Activity: No   Other Topics Concern  . Not on file   Social History Narrative    FAMILY HISTORY: Family History  Problem Relation Age of Onset  . Anesthesia problems Mother   . Cancer Mother     thyroid  . Hypertension Mother   . Anesthesia problems Sister   . Hypertension Sister   . Cancer Maternal Aunt   . Cancer Maternal Aunt   . Colon cancer Sister   . Stroke Sister   . Hypertension Brother     REVIEW OF SYSTEMS: Reviewed ROS from Dr. Oleta Mouse and P note on 08/15/15. Re-examined and reviewed with patient with additonal findings as noted in HEENT. Constitutional: Positive for fever. Negative for chills.  Respiratory: Negative for cough and shortness of breath.  Cardiovascular: Negative for chest pain.  HEENT: Positive for tooth pain and facial swelling lower right. Gastrointestinal: Positive for abdominal pain. Negative for nausea, vomiting, diarrhea, constipation and blood in stool.  Genitourinary: Positive for dysuria.    DENTAL HISTORY: CHIEF COMPLAINT: Patient referred for evaluation of right facial swelling.  HPI: [name] is a 54 year old female recently admitted for diverticulitis. Patient is on antibiotic therapy per [facility]. Patient was recently found to have right facial swelling. A dental consultation was requested to evaluate the right facial swelling and provide treatment as indicated.  Patient gives a history of toothache and facial swelling starting approximately 2 to 3 days ago.  Patient has been experiencing sharp, spontaneous pain that reached an intensity of 10 out of 10. Pain is currently 0 out of 10 by patient report with the IV antibiotic therapy and pain management.  Pain is worse with chewing onn the right side of her mouth. The patient last saw a dentist approximately one year ago. Patient was seen for an exam with the plan for multiple extractions. Patient did not proceed with dental treatment at that time. The patient denies having partial dentures.  DENTAL EXAMINATION: GENERAL: The patient is a well-developed, well-nourished female in no acute distress. HEAD AND NECK: There is right neck lymphadenopathy. Patient denies trismus symptoms. INTRAORAL EXAM: Patient has normal saliva. There is facial vestibule swelling in the area of #25-29. DENTITION: Patient has multiple missing teeth. The patient has multiple retained root segments in the area of tooth numbers 1, 2, 3, 4, 5, 6, 7, 10, 11, 12, 14, 23, 24, 26, 27, and 29. PERIODONTAL: Patient has chronic periodontitis with plaque and calculus accumulations, generalized gingival recession, and tooth mobility. DENTAL CARIES/SUBOPTIMAL RESTORATIONS: Multiple dental caries are noted affecting the remaining teeth. ENDODONTIC: Patient with a history of acute pulpitis symptoms although none today. Patient has multiple areas of periapical pathology and radiolucency. CROWN AND BRIDGE: There are no crown or bridge restorations. PROSTHODONTIC: Patient denies having partial dentures. OCCLUSION: Patient has a poor occlusal scheme secondary to multiple  missing teeth, multiple retained root segments, and lack of replacement of missing teeth with dental  prostheses.  RADIOGRAPHIC INTERPRETATION: A suboptimal orthopantogram was taken on 08/17/2015. There are multiple missing teeth. There are multiple retained root segments. Rampant dental caries are noted. There are multiple areas of periapical pathology and radiolucency.There is moderate to severe bone loss noted.  ASSESSMENTS: 1. Diverticulitis with current antibiotic therapy 2. Right facial swelling 3. History of acute pulpitis 4. Chronic apical periodontitis 5. Rampant dental caries 6. Multiple retained root segments 7. Chronic periodontitis with bone loss 8. Multiple missing teeth 9. Malocclusion and poor occlusal scheme 10. History of dental neglect 11. Multiple drug allergies   PLAN/RECOMMENDATIONS: 1. I discussed the risks, benefits, and complications of various treatment options with the patient in relationship to her medical and dental conditions. We discussed various treatment options to include no treatment, multiple extractions with alveoloplasty, pre-prosthetic surgery as indicated, periodontal therapy, dental restorations, root canal therapy, crown and bridge therapy, implant therapy, and replacement of missing teeth as indicated. The patient currently wishes to proceed with extraction of all remaining teeth with alveoloplasty in the operating room with general anesthesia. This has been tentatively scheduled for this coming Friday at 7:30 AM [facility]. Patient will then follow up the dentist of her choice for fabrication of upper and lower complete dentures after adequate healing. In the meantime, patient will be kept on antibiotic therapy [facility].   2. Discussion of findings with medical team and coordination of future medical and dental care as needed.    Charlynne Panderonald F. Kulinski, DDS

## 2015-08-17 NOTE — Progress Notes (Signed)
[facility] Progress Note     Subjective: Pt denies any N/V, abdominal pain minimal in LLQ.  Up walking around.  Had a more formed BM.  Still c/o right gum pain on right lower where she has several fractured teeth.  Pain started 3-4 days ago.  C/o cheek swelling and difficult talking due to swelling.    Objective: Vital signs in last 24 hours: Temp:  [98.7 F (37.1 C)-102.1 F (38.9 C)] 99.7 F (37.6 C) (12/13 0613) Pulse Rate:  [68-93] 77 (12/13 0613) Resp:  [16-18] 17 (12/13 0613) BP: (111-130)/(57-70) 114/57 mmHg (12/13 0613) SpO2:  [88 %-97 %] 97 % (12/13 0613) Last BM Date: 08/15/15  Intake/Output from previous day: 12/12 0701 - 12/13 0700 In: 3393.3 [P.O.:600; I.V.:2343.3; IV Piggyback:450] Out: -  Intake/Output this shift: Total I/O In: 371.7 [I.V.:371.7] Out: -   PE: Gen:  Alert, NAD, pleasant Teeth:  Poor dentition overall.  Has multiple fractured teeth on top and bottom.  Around 26-28 she has gum swelling/erythema and fractured teeth at root, gums are very sensitive and her entire right cheek is swollen Abd: Soft, minimal tenderness to LLQ, +BS, no HSM   Lab Results:   Recent Labs  08/16/15 0557 08/17/15 0525  WBC 8.8 9.5  HGB 12.1 11.7*  HCT 37.7 38.4  PLT 252 239   BMET  Recent Labs  08/15/15 1603 08/16/15 0557  NA 138 137  K 3.8 3.9  CL 102 100*  CO2 28 30  GLUCOSE 109* 101*  BUN 7 <5*  CREATININE 0.95 0.84  CALCIUM 9.3 8.8*   PT/INR No results for input(s): LABPROT, INR in the last 72 hours. CMP     Component Value Date/Time   NA 137 08/16/2015 0557   K 3.9 08/16/2015 0557   CL 100* 08/16/2015 0557   CO2 30 08/16/2015 0557   GLUCOSE 101* 08/16/2015 0557   BUN <5* 08/16/2015 0557   CREATININE 0.84 08/16/2015 0557   CREATININE 0.72 01/29/2015 1216   CALCIUM 8.8* 08/16/2015 0557   PROT 6.5 08/15/2015 1603   ALBUMIN 3.1* 08/15/2015 1603   AST 51* 08/15/2015 1603   ALT 47 08/15/2015 1603   ALKPHOS 99 08/15/2015 1603    BILITOT 0.6 08/15/2015 1603   GFRNONAA >60 08/16/2015 0557   GFRNONAA >89 01/29/2015 1216   GFRAA >60 08/16/2015 0557   GFRAA >89 01/29/2015 1216   Lipase     Component Value Date/Time   LIPASE 23 08/15/2015 1603       Studies/Results: Ct Abdomen Pelvis W Contrast  08/15/2015  CLINICAL DATA:  Left lower quadrant abdominal pain starting Thursday. History of diverticulitis. EXAM: CT ABDOMEN AND PELVIS WITH CONTRAST TECHNIQUE: Multidetector CT imaging of the abdomen and pelvis was performed using the standard protocol following bolus administration of intravenous contrast. CONTRAST:  OMNIPAQUE IOHEXOL 300 MG/ML  SOLN COMPARISON:  12/24/2014 FINDINGS: Lower chest:  Minimal scarring in the lingula and right middle lobe. Hepatobiliary: Punctate calcification along the anterior dome of the left hepatic lobe, likely from remote inflammatory reaction. There is a fold causing narrowing in the central gallbladder very similar in appearance to 12/24/14, but probably incidental. No biliary dilatation or significant focal hepatic lesion identified. Pancreas: Unremarkable Spleen: Unremarkable Adrenals/Urinary Tract: 5 mm hypodense lesion in the right mid kidney posteriorly, image 7 series 301, not appreciably changed from prior. This is statistically likely to be a cyst but technically nonspecific. Stomach/Bowel: There is sigmoid diverticulosis along with marked wall thickening in the distal  sigmoid colon near the rectal junction, with abnormal density and fluid along the right side of the sigmoid colon, poor definition of fat planes, and questionable loculations of extraluminal gas. A loop of small bowel appears to be matted down immediately in this vicinity and confused is the appearance. There is abnormal adjacent presacral edema and edema tracking along the adjacent fat planes. I am suspicious for early abscess formation adjacent to the distal sigmoid, measuring about 2.0 by 1.2 cm on image 67 series  201. Possible additional abscess to the right of the rectosigmoid junction measuring about 2.7 by 1.4 cm on image 78 series 203, although very indistinct. There is previously wall thickening in this region but much less striking. The appendix appears normal. Vascular/Lymphatic: Small pelvic lymph nodes are not pathologically enlarged by size criteria. Reproductive: Uterus absent.  Ovaries unremarkable. Other: No supplemental non-categorized findings. Musculoskeletal: Sclerosis of the pubic bodies. Lower lumbar spondylosis noted causing mild left foraminal stenosis at L5-S1 and potentially at L4-5. IMPRESSION: 1. Marked wall thickening in the distal sigmoid colon with tiny locules of extraluminal gas suggesting local micro perforation, as well as vague ill-defined fluid densities along the or right wall of the distal sigmoid suspicious for early abscesses. There is extensive confluent stranding and poor definition of fat planes in this vicinity, and also presacral edema. This could represent recurrent sigmoid diverticulitis with microperforation and early abscess formation. Please note that a distal sigmoid colon tumor with perforation is a differential diagnostic consideration and following or during treatment of the patient's current acute process, the possibility of malignancy should be investigated. 2. Other imaging findings of potential clinical significance: Mild osteitis pubis. Lower lumbar spondylosis causing mild left foraminal stenosis at L5-S1 and perhaps L4-5. Electronically Signed   By: Gaylyn RongWalter  Liebkemann M.D.   On: 08/15/2015 19:06    Anti-infectives: Anti-infectives    Start     Dose/Rate Route Frequency Ordered Stop   08/16/15 0400  metroNIDAZOLE (FLAGYL) IVPB 500 mg     500 mg 100 mL/hr over 60 Minutes Intravenous Every 8 hours 08/15/15 2224     08/15/15 2200  aztreonam (AZACTAM) 2 g in dextrose 5 % 50 mL IVPB     2 g 100 mL/hr over 30 Minutes Intravenous 3 times per day 08/15/15 1942      08/15/15 1930  metroNIDAZOLE (FLAGYL) IVPB 500 mg     500 mg 100 mL/hr over 60 Minutes Intravenous  Once 08/15/15 1927 08/15/15 2107       Assessment/Plan 1.Recurrent diverticulitis, hospitalized 3/15-24/2016, abscess to small to drain -Clear liquids, but if doing well later today can advance to fulls -IV antibiotics -Ambulate and IS -Hopefully will continue to heal with medical management.   -Had recent colonoscopy 04/20/15 without tumor, will need to see us in the office to discuss whether elective colectomy is needed.  2. Hypertension 3. Bipolar disorder/Anxiety/Depression 4. GERD 5. Hypothyroid 6. Chronic back pain 7.  Chronic bronchitis/ongoing tobacco use/hx of pneumonia 8. Multiple allergies (penicillin, Sulfa drugs, Cipro, Clindamycin, Erythromycin, Zithromax) 9. Poor dentition/Dental abscess at root of fractured tooth 26-28? on right lower - Consulted Dr. Kristin BruinsKulinski, ordered panorex, peridex, talked with pharmacy, cipro allergy is "funny sensation in gums", will see if she can tolerate levaquin (Day #1), if not will have to order vancomycin. 10.  Current antibiotics: Day #3 Aztreonam/ Flagyl 11. DVT: Lovenox/SCD    LOS: 2 days    Nonie HoyerMegan N Lashala Laser 08/17/2015, 9:52 AM Pager: [phone]

## 2015-08-18 MED ORDER — SACCHAROMYCES BOULARDII 250 MG PO CAPS
250.0000 mg | ORAL_CAPSULE | Freq: Two times a day (BID) | ORAL | Status: DC
  Administered 2015-08-18 – 2015-08-21 (×5): 250 mg via ORAL
  Filled 2015-08-18 (×6): qty 1

## 2015-08-18 MED ORDER — METRONIDAZOLE 500 MG PO TABS
500.0000 mg | ORAL_TABLET | Freq: Three times a day (TID) | ORAL | Status: DC
  Administered 2015-08-18 – 2015-08-22 (×9): 500 mg via ORAL
  Filled 2015-08-18 (×11): qty 1

## 2015-08-18 MED ORDER — SODIUM CHLORIDE 0.9 % IV BOLUS (SEPSIS)
500.0000 mL | Freq: Once | INTRAVENOUS | Status: AC
Start: 2015-08-18 — End: 2015-08-18
  Administered 2015-08-18: 500 mL via INTRAVENOUS

## 2015-08-18 MED ORDER — HYDROMORPHONE HCL 1 MG/ML IJ SOLN
1.0000 mg | INTRAMUSCULAR | Status: DC | PRN
  Administered 2015-08-18 – 2015-08-19 (×2): 1 mg via INTRAVENOUS
  Filled 2015-08-18 (×2): qty 1

## 2015-08-18 MED ORDER — ATENOLOL 50 MG PO TABS
50.0000 mg | ORAL_TABLET | Freq: Every day | ORAL | Status: DC
Start: 2015-08-19 — End: 2015-08-19
  Administered 2015-08-19: 50 mg via ORAL
  Filled 2015-08-18: qty 1

## 2015-08-18 MED ORDER — OXYCODONE-ACETAMINOPHEN 5-325 MG PO TABS
1.0000 | ORAL_TABLET | ORAL | Status: DC | PRN
Start: 2015-08-18 — End: 2015-08-22
  Administered 2015-08-18 – 2015-08-22 (×9): 2 via ORAL
  Filled 2015-08-18 (×4): qty 2
  Filled 2015-08-18: qty 1
  Filled 2015-08-18 (×5): qty 2

## 2015-08-18 NOTE — Progress Notes (Signed)
Bp this pm better 106 range, HR at 68.  I put parameters on the atenolol.  She did not get fluids earlier, but will get them now.  If she continues to have low BP we will need to adjust the BP meds next 24-48 hours.

## 2015-08-18 NOTE — Progress Notes (Signed)
Pt BP 97/53, 69 HR,  Pt to receive amlodipine and atenolol this am, meds not given to low BP, pt received dilaudid at this time for pain.

## 2015-08-18 NOTE — Care Management Note (Signed)
Case Management Note  Patient Details  Name: Michail JewelsSandra L Lalor MRN: [id number] Date of Birth: 12/01/1960  Subjective/Objective:                    Action/Plan:   Expected Discharge Date:                  Expected Discharge Plan:  Home/Self Care  In-House Referral:     Discharge planning Services     Post Acute Care Choice:    Choice offered to:     DME Arranged:    DME Agency:     HH Arranged:    HH Agency:     Status of Service:  In process, will continue to follow  Medicare Important Message Given:    Date Medicare IM Given:    Medicare IM give by:    Date Additional Medicare IM Given:    Additional Medicare Important Message give by:     If discussed at Long Length of Stay Meetings, dates discussed:    Additional Comments: UR updated  Kingsley PlanWile, Nalanie Winiecki Marie, RN 08/18/2015, 1:25 PM

## 2015-08-18 NOTE — Progress Notes (Signed)
Subjective: No real change a little crampy.  BP down some and her voiding is up I think she is just dry holding AM. Abdomen isn't all that tender.  Tolerating fulls, and had a BM yesterday.  Objective: Vital signs in last 24 hours: Temp:  [98.3 F (36.8 C)-100 F (37.8 C)] 98.3 F (36.8 C) (12/14 0559) Pulse Rate:  [47-69] 69 (12/14 0853) Resp:  [16-17] 16 (12/14 0559) BP: (94-115)/(53-64) 97/53 mmHg (12/14 0853) SpO2:  [94 %-95 %] 94 % (12/14 0559) Last BM Date: 08/17/15 960 PO  Diet: full liquids Voided x 10] Bm x 1 BP is a little low No labs today orthogram 08/17/15:  Extensive dental disease with findings worrisome for periapical Abscesses. Intake/Output from previous day: 12/13 0701 - 12/14 0700 In: 3913.3 [P.O.:960; I.V.:2403.3; IV Piggyback:550] Out: -  Intake/Output this shift:    General appearance: alert, cooperative and no distress GI: soft, complains of cramping.  no distension, minimal changes on palpation.  + BS, +BM  Lab Results:   Recent Labs  08/16/15 0557 08/17/15 0525  WBC 8.8 9.5  HGB 12.1 11.7*  HCT 37.7 38.4  PLT 252 239    BMET  Recent Labs  08/15/15 1603 08/16/15 0557  NA 138 137  K 3.8 3.9  CL 102 100*  CO2 28 30  GLUCOSE 109* 101*  BUN 7 <5*  CREATININE 0.95 0.84  CALCIUM 9.3 8.8*   PT/INR No results for input(s): LABPROT, INR in the last 72 hours.   Recent Labs Lab 08/15/15 1603  AST 51*  ALT 47  ALKPHOS 99  BILITOT 0.6  PROT 6.5  ALBUMIN 3.1*     Lipase     Component Value Date/Time   LIPASE 23 08/15/2015 1603     Studies/Results: Dg Orthopantogram  08/17/2015  CLINICAL DATA:  The patient reports a blow to a right lower tooth 5 days ago. Pain and swelling. EXAM: ORTHOPANTOGRAM/PANORAMIC COMPARISON:  None. FINDINGS: The patient is missing multiple teeth. Large cavities are identified in virtually all of the patient's remaining teeth. A periapical lucency is seen about the most posterior lower tooth on the  right, likely 29. There may also be lucency about lower teeth 23 to 27 although this could be artifactual. Lucency is also seen about the upper left canine, tooth 11. IMPRESSION: Extensive dental disease with findings worrisome for periapical abscesses as described above. Electronically Signed   By: Drusilla Kannerhomas  Dalessio M.D.   On: 08/17/2015 13:02    Medications: . amLODipine  10 mg Oral Daily  . atenolol  50 mg Oral Daily  . aztreonam  2 g Intravenous 3 times per day  . chlorhexidine  15 mL Mouth/Throat QID  . clorazepate  7.5 mg Oral BID  . enoxaparin (LOVENOX) injection  40 mg Subcutaneous Q24H  . levofloxacin  750 mg Oral Daily  . levothyroxine  125 mcg Oral QAC breakfast  . metronidazole  500 mg Intravenous Q8H  . QUEtiapine  50 mg Oral QHS  . saccharomyces boulardii  250 mg Oral BID    Assessment/Plan 1.Recurrent diverticulitis, hospitalized 3/15-24/2016, abscess to small to drain 2. Hypertension 3. Bipolar disorder/Anxiety/Depression 4. GERD 5. Hypothyroid 6. Chronic back pain 7. Chronic bronchitis/ongoing tobacco use/hx of pneumonia 8. Multiple allergies (penicillin, Sulfa drugs, Cipro, Clindamycin, Erythromycin, Zithromax) 9. Poor dentition/Dental abscess at root of fractured tooth 26-28? on right lower  10. Current antibiotics: Day #4 Aztreonam/ Flagyl   Day 2 Levofloxin 11. DVT: Lovenox/SCD   Plan:  I am  going to leave her on fulls.  Put her on probiotic and reserve dilaudid for acute pain only. I will give her some IV fluids she is on BB and I want to keep her on some even if it is low dose,  I told her to walk around more.  Dental extractions on Friday.08/20/15.       LOS: 3 days    [name] 08/18/2015

## 2015-08-18 NOTE — Progress Notes (Signed)
Spoke with Brooke ButtonWill Jennings, PA about pt's low BP and holding amlodipine and atenolol this am. Requested I recheck her BP later today close to 4pm, and he would reassess need for the atenolol.

## 2015-08-18 NOTE — Progress Notes (Signed)
Pharmacy Antibiotic Follow-up Note  [name] is a 54 y.o. year-old female admitted on 08/15/2015.  The patient is currently on day 4 of aztreonam/Flagyl and day 2 of Levaquin for diverticular abscess and dental abscesses.  Assessment/Plan: Recurrent diverticulitis (abscess too small to drain); dental abscesses- plan to extract all remaining teeth and alveoloplasty on 08/20/15. WBC wnl, Tmax 100, no micro  Plan:  MD- Please consider if aztreonam is needed with LVQ and Flagyl.  For now, continue aztreonam 2g IV q8h.  Watch renal fxn, clinical status F/up abx LOT after dental extractions.    Temp (24hrs), Avg:98.4 F (36.9 C), Min:98.3 F (36.8 C), Max:98.4 F (36.9 C)   Recent Labs Lab 08/15/15 1603 08/16/15 0557 08/17/15 0525  WBC 10.5 8.8 9.5    Recent Labs Lab 08/15/15 1603 08/16/15 0557  CREATININE 0.95 0.84   Estimated Creatinine Clearance: 87.1 mL/min (by C-G formula based on Cr of 0.84).    Allergies  Allergen Reactions  . Benazepril Anaphylaxis, Hives and Itching  . Penicillins Hives    Has itching and some swelling in face  . Sulfa Antibiotics Hives    Itching and facial swelling  . Ciprofloxacin Hcl Hives    "funny sensation in gums"  . Clindamycin/Lincomycin Hives    Itching, welps  . Erythromycin Hives    itching  . Other     Pt has problems with being awaken from anesthesia, Her blood pressure drops very low  . Zithromax [Azithromycin] Hives    itching    Antimicrobials this admission: Flagyl po 12/11 >> Azactam 12/11 >> Levaquin 12/13 >>  Levels/dose changes this admission: none  Microbiology results: none  Thank you for allowing pharmacy to be a part of this patient's care.  Fayne NorrieMillen, Sayer Masini Brown PharmD 08/18/2015 2:51 PM

## 2015-08-18 NOTE — Progress Notes (Signed)
BP improved, 108/73, HR 68. Pt ate well at lunch (full liquids). Informed PA of current BP/HR, just noted order for IVF bolus that was written for this am (PA had mentioned he may, then said he wasn't going to give any extra fluids). Pt already on NS at 100cc/hr.   Per PA  plan at this time is to not give any atenolol today, restart tomorrow with new parameters, and  PA would like pt to still have extra IVF bolus. Will give as soon as IV antibiotic currently infusing completes.

## 2015-08-19 DIAGNOSIS — K045 Chronic apical periodontitis: Secondary | ICD-10-CM

## 2015-08-19 DIAGNOSIS — K0401 Reversible pulpitis: Secondary | ICD-10-CM

## 2015-08-19 DIAGNOSIS — K029 Dental caries, unspecified: Secondary | ICD-10-CM

## 2015-08-19 DIAGNOSIS — K047 Periapical abscess without sinus: Secondary | ICD-10-CM

## 2015-08-19 DIAGNOSIS — K083 Retained dental root: Secondary | ICD-10-CM

## 2015-08-19 MED ORDER — ALBUTEROL SULFATE (2.5 MG/3ML) 0.083% IN NEBU: 2.5000 mg | INHALATION_SOLUTION | RESPIRATORY_TRACT | Status: DC | PRN

## 2015-08-19 MED ORDER — ENOXAPARIN SODIUM 40 MG/0.4ML ~~LOC~~ SOLN
40.0000 mg | SUBCUTANEOUS | Status: DC
  Administered 2015-08-21 – 2015-08-22 (×2): 40 mg via SUBCUTANEOUS
  Filled 2015-08-19 (×3): qty 0.4

## 2015-08-19 NOTE — Progress Notes (Signed)
PRE-OPERATIVE NOTE:  08/19/2015 [name] [id number]  VITALS: BP 96/52 mmHg  Pulse 86  Temp(Src) 98.4 F (36.9 C) (Oral)  Resp 18  Ht 5\' 7"  (1.702 m)  Wt 198 lb (89.812 kg)  BMI 31.00 kg/m2  SpO2 100%  Lab Results  Component Value Date   WBC 9.5 08/17/2015   HGB 11.7* 08/17/2015   HCT 38.4 08/17/2015   MCV 90.1 08/17/2015   PLT 239 08/17/2015   BMET    Component Value Date/Time   NA 137 08/16/2015 0557   K 3.9 08/16/2015 0557   CL 100* 08/16/2015 0557   CO2 30 08/16/2015 0557   GLUCOSE 101* 08/16/2015 0557   BUN <5* 08/16/2015 0557   CREATININE 0.84 08/16/2015 0557   CREATININE 0.72 01/29/2015 1216   CALCIUM 8.8* 08/16/2015 0557   GFRNONAA >60 08/16/2015 0557   GFRNONAA >89 01/29/2015 1216   GFRAA >60 08/16/2015 0557   GFRAA >89 01/29/2015 1216    Lab Results  Component Value Date   INR 1.10 11/24/2014   No results found for: PTT   [name] is planned for extraction of remaining teeth with alveoloplasty in the operating room with general anesthesia.   SUBJECTIVE: The patient denies any acute medical or dental changes and agrees to proceed with treatment as planned.  EXAM: No sign of acute dental changes. Facials swelling has decreased.  ASSESSMENT: Patient is affected by right facial swelling, apical periodontitis, retained root segments, dental caries and loose teeth.  PLAN: Patient agrees to proceed with treatment as planned in the operating room as previously discussed and accepts the risks, benefits, and complications of the proposed treatment. Patient is aware of the risk for bleeding, bruising, swelling, infection, pain, nerve damage, sinus involvement, root tip fracture, mandible fracture, and the risks of complications associated with the anesthesia. Patient also is aware of the potential for other complications not mentioned above.   Charlynne Panderonald F. Dorianna Mckiver, DDS

## 2015-08-19 NOTE — Progress Notes (Signed)
Patient ID: [name], female   DOB: 11/08/1960, 54 y.o.   MRN: [id number]    Subjective: Pt appears comfortable and is hungry for more than fulls.  No nausea  Objective: Vital signs in last 24 hours: Temp:  [98.4 F (36.9 C)-99.3 F (37.4 C)] 98.4 F (36.9 C) (12/15 0500) Pulse Rate:  [63-86] 86 (12/15 1005) Resp:  [18] 18 (12/15 0500) BP: (96-123)/(52-73) 96/52 mmHg (12/15 1005) SpO2:  [98 %-100 %] 100 % (12/15 0500) Last BM Date: 08/18/15  Intake/Output from previous day: 12/14 0701 - 12/15 0700 In: 3730 [P.O.:1110; I.V.:2470; IV Piggyback:150] Out: -  Intake/Output this shift:    PE: Abd: soft, NT, ND, +BS  Lab Results:   Recent Labs  08/17/15 0525  WBC 9.5  HGB 11.7*  HCT 38.4  PLT 239   BMET No results for input(s): NA, K, CL, CO2, GLUCOSE, BUN, CREATININE, CALCIUM in the last 72 hours. PT/INR No results for input(s): LABPROT, INR in the last 72 hours. CMP     Component Value Date/Time   NA 137 08/16/2015 0557   K 3.9 08/16/2015 0557   CL 100* 08/16/2015 0557   CO2 30 08/16/2015 0557   GLUCOSE 101* 08/16/2015 0557   BUN <5* 08/16/2015 0557   CREATININE 0.84 08/16/2015 0557   CREATININE 0.72 01/29/2015 1216   CALCIUM 8.8* 08/16/2015 0557   PROT 6.5 08/15/2015 1603   ALBUMIN 3.1* 08/15/2015 1603   AST 51* 08/15/2015 1603   ALT 47 08/15/2015 1603   ALKPHOS 99 08/15/2015 1603   BILITOT 0.6 08/15/2015 1603   GFRNONAA >60 08/16/2015 0557   GFRNONAA >89 01/29/2015 1216   GFRAA >60 08/16/2015 0557   GFRAA >89 01/29/2015 1216   Lipase     Component Value Date/Time   LIPASE 23 08/15/2015 1603       Studies/Results: Dg Orthopantogram  08/17/2015  CLINICAL DATA:  The patient reports a blow to a right lower tooth 5 days ago. Pain and swelling. EXAM: ORTHOPANTOGRAM/PANORAMIC COMPARISON:  None. FINDINGS: The patient is missing multiple teeth. Large cavities are identified in virtually all of the patient's remaining teeth. A periapical lucency is  seen about the most posterior lower tooth on the right, likely 29. There may also be lucency about lower teeth 23 to 27 although this could be artifactual. Lucency is also seen about the upper left canine, tooth 11. IMPRESSION: Extensive dental disease with findings worrisome for periapical abscesses as described above. Electronically Signed   By: Drusilla Kannerhomas  Dalessio M.D.   On: 08/17/2015 13:02    Anti-infectives: Anti-infectives    Start     Dose/Rate Route Frequency Ordered Stop   08/18/15 0930  metroNIDAZOLE (FLAGYL) tablet 500 mg     500 mg Oral 3 times per day 08/18/15 0922     08/17/15 1200  levofloxacin (LEVAQUIN) tablet 750 mg     750 mg Oral Daily 08/17/15 1105     08/16/15 0400  metroNIDAZOLE (FLAGYL) IVPB 500 mg  Status:  Discontinued     500 mg 100 mL/hr over 60 Minutes Intravenous Every 8 hours 08/15/15 2224 08/18/15 0922   08/15/15 2200  aztreonam (AZACTAM) 2 g in dextrose 5 % 50 mL IVPB     2 g 100 mL/hr over 30 Minutes Intravenous 3 times per day 08/15/15 1942     08/15/15 1930  metroNIDAZOLE (FLAGYL) IVPB 500 mg     500 mg 100 mL/hr over 60 Minutes Intravenous  Once 08/15/15 1927 08/15/15 2107  Assessment/Plan  1.Recurrent diverticulitis, hospitalized 3/15-24/2016, abscess too small to drain -improving, adv to soft diet today -cont abx, but will DC Aztreonam and leave on oral abx (levaquin/Flagyl) 2. Hypertension -cont amlodipine, but will hold atenolol for now and see how BP does 3. Bipolar disorder/Anxiety/Depression 4. GERD 5. Hypothyroid 6. Chronic back pain 7. Chronic bronchitis/ongoing tobacco use/hx of pneumonia 8. Multiple allergies (penicillin, Sulfa drugs, Cipro, Clindamycin, Erythromycin, Zithromax) 9. Poor dentition/Dental abscess at root of fractured tooth 26-28? on right lower  -OR tomorrow for dental extractions 10. Current antibiotics: Day #5 Aztreonam/ Flagyl Day 3 Levofloxin 11. DVT: Lovenox/SCD   LOS: 4 days     Armstrong Creasy E 08/19/2015, 10:34 AM Pager: [phone]

## 2015-08-20 ENCOUNTER — Inpatient Hospital Stay (HOSPITAL_COMMUNITY): Payer: Medicaid Other | Admitting: Anesthesiology

## 2015-08-20 ENCOUNTER — Encounter (HOSPITAL_COMMUNITY): Payer: Self-pay | Admitting: Anesthesiology

## 2015-08-20 ENCOUNTER — Encounter (HOSPITAL_COMMUNITY): Admission: EM | Disposition: A | Discharge status: Home / Self Care | Payer: Self-pay | Source: Home / Self Care

## 2015-08-20 DIAGNOSIS — K045 Chronic apical periodontitis: Secondary | ICD-10-CM | POA: Diagnosis present

## 2015-08-20 DIAGNOSIS — K029 Dental caries, unspecified: Secondary | ICD-10-CM

## 2015-08-20 DIAGNOSIS — K083 Retained dental root: Secondary | ICD-10-CM | POA: Diagnosis present

## 2015-08-20 DIAGNOSIS — K0401 Reversible pulpitis: Secondary | ICD-10-CM

## 2015-08-20 DIAGNOSIS — K047 Periapical abscess without sinus: Secondary | ICD-10-CM | POA: Diagnosis present

## 2015-08-20 HISTORY — PX: MULTIPLE EXTRACTIONS WITH ALVEOLOPLASTY: SHX5342

## 2015-08-20 LAB — SURGICAL PCR SCREEN
MRSA, PCR: NEGATIVE
Staphylococcus aureus: NEGATIVE

## 2015-08-20 SURGERY — MULTIPLE EXTRACTION WITH ALVEOLOPLASTY
Anesthesia: General | Site: Mouth

## 2015-08-20 MED ORDER — LACTATED RINGERS IV SOLN: INTRAVENOUS | Status: DC

## 2015-08-20 MED ORDER — FENTANYL CITRATE (PF) 100 MCG/2ML IJ SOLN
INTRAMUSCULAR | Status: DC | PRN
Start: 2015-08-20 — End: 2015-08-20
  Administered 2015-08-20 (×3): 50 ug via INTRAVENOUS
  Administered 2015-08-20: 100 ug via INTRAVENOUS

## 2015-08-20 MED ORDER — LIDOCAINE-EPINEPHRINE 2 %-1:100000 IJ SOLN
INTRAMUSCULAR | Status: AC
  Filled 2015-08-20: qty 10.2

## 2015-08-20 MED ORDER — ROCURONIUM BROMIDE 50 MG/5ML IV SOLN
INTRAVENOUS | Status: AC
  Filled 2015-08-20: qty 1

## 2015-08-20 MED ORDER — DEXAMETHASONE SODIUM PHOSPHATE 4 MG/ML IJ SOLN
INTRAMUSCULAR | Status: AC
  Filled 2015-08-20: qty 2

## 2015-08-20 MED ORDER — LIDOCAINE HCL (CARDIAC) 20 MG/ML IV SOLN
INTRAVENOUS | Status: AC
  Filled 2015-08-20: qty 5

## 2015-08-20 MED ORDER — CHLORHEXIDINE GLUCONATE 0.12 % MT SOLN
15.0000 mL | Freq: Two times a day (BID) | OROMUCOSAL | Status: DC
  Administered 2015-08-21: 15 mL via OROMUCOSAL
  Filled 2015-08-20: qty 15

## 2015-08-20 MED ORDER — MIDAZOLAM HCL 5 MG/5ML IJ SOLN
INTRAMUSCULAR | Status: DC | PRN
  Administered 2015-08-20: 2 mg via INTRAVENOUS

## 2015-08-20 MED ORDER — GLYCOPYRROLATE 0.2 MG/ML IJ SOLN
INTRAMUSCULAR | Status: DC | PRN
  Administered 2015-08-20: .4 mg via INTRAVENOUS

## 2015-08-20 MED ORDER — BUPIVACAINE-EPINEPHRINE (PF) 0.5% -1:200000 IJ SOLN
INTRAMUSCULAR | Status: AC
  Filled 2015-08-20: qty 3.6

## 2015-08-20 MED ORDER — MIDAZOLAM HCL 2 MG/2ML IJ SOLN
INTRAMUSCULAR | Status: AC
  Filled 2015-08-20: qty 2

## 2015-08-20 MED ORDER — ROCURONIUM BROMIDE 100 MG/10ML IV SOLN
INTRAVENOUS | Status: DC | PRN
  Administered 2015-08-20: 50 mg via INTRAVENOUS
  Administered 2015-08-20: 15 mg via INTRAVENOUS

## 2015-08-20 MED ORDER — LABETALOL HCL 5 MG/ML IV SOLN
INTRAVENOUS | Status: DC | PRN
  Administered 2015-08-20 (×2): 5 mg via INTRAVENOUS

## 2015-08-20 MED ORDER — 0.9 % SODIUM CHLORIDE (POUR BTL) OPTIME
TOPICAL | Status: DC | PRN
  Administered 2015-08-20: 1000 mL

## 2015-08-20 MED ORDER — ESMOLOL HCL 100 MG/10ML IV SOLN
INTRAVENOUS | Status: DC | PRN
  Administered 2015-08-20 (×3): 10 mg via INTRAVENOUS

## 2015-08-20 MED ORDER — NEOSTIGMINE METHYLSULFATE 10 MG/10ML IV SOLN
INTRAVENOUS | Status: DC | PRN
  Administered 2015-08-20: 3 mg via INTRAVENOUS

## 2015-08-20 MED ORDER — FENTANYL CITRATE (PF) 250 MCG/5ML IJ SOLN
INTRAMUSCULAR | Status: AC
  Filled 2015-08-20: qty 5

## 2015-08-20 MED ORDER — FENTANYL CITRATE (PF) 100 MCG/2ML IJ SOLN
INTRAMUSCULAR | Status: AC
  Filled 2015-08-20: qty 2

## 2015-08-20 MED ORDER — PROPOFOL 10 MG/ML IV BOLUS
INTRAVENOUS | Status: DC | PRN
  Administered 2015-08-20: 150 mg via INTRAVENOUS

## 2015-08-20 MED ORDER — OXYMETAZOLINE HCL 0.05 % NA SOLN
NASAL | Status: AC
  Filled 2015-08-20: qty 15

## 2015-08-20 MED ORDER — BUPIVACAINE-EPINEPHRINE 0.5% -1:200000 IJ SOLN
INTRAMUSCULAR | Status: DC | PRN
  Administered 2015-08-20: 2 mL

## 2015-08-20 MED ORDER — FENTANYL CITRATE (PF) 100 MCG/2ML IJ SOLN
25.0000 ug | INTRAMUSCULAR | Status: DC | PRN
  Administered 2015-08-20: 50 ug via INTRAVENOUS
  Administered 2015-08-20 (×2): 25 ug via INTRAVENOUS

## 2015-08-20 MED ORDER — ARTIFICIAL TEARS OP OINT
TOPICAL_OINTMENT | OPHTHALMIC | Status: DC | PRN
  Administered 2015-08-20: 1 via OPHTHALMIC

## 2015-08-20 MED ORDER — ONDANSETRON HCL 4 MG/2ML IJ SOLN
INTRAMUSCULAR | Status: AC
  Filled 2015-08-20: qty 2

## 2015-08-20 MED ORDER — WHITE PETROLATUM GEL
Status: AC
  Administered 2015-08-20: 16:00:00
  Filled 2015-08-20: qty 1

## 2015-08-20 MED ORDER — LIDOCAINE-EPINEPHRINE 2 %-1:100000 IJ SOLN
INTRAMUSCULAR | Status: DC | PRN
  Administered 2015-08-20: 1.7 mL via INTRADERMAL

## 2015-08-20 MED ORDER — LACTATED RINGERS IV SOLN
INTRAVENOUS | Status: DC | PRN
  Administered 2015-08-20: 07:00:00 via INTRAVENOUS

## 2015-08-20 MED ORDER — PROPOFOL 10 MG/ML IV BOLUS
INTRAVENOUS | Status: AC
  Filled 2015-08-20: qty 20

## 2015-08-20 MED ORDER — LIDOCAINE HCL (CARDIAC) 20 MG/ML IV SOLN
INTRAVENOUS | Status: DC | PRN
  Administered 2015-08-20: 100 mg via INTRAVENOUS

## 2015-08-20 MED ORDER — MORPHINE SULFATE (PF) 2 MG/ML IV SOLN
1.0000 mg | INTRAVENOUS | Status: DC | PRN
  Administered 2015-08-20: 2 mg via INTRAVENOUS
  Administered 2015-08-20: 1 mg via INTRAVENOUS
  Administered 2015-08-21 (×3): 2 mg via INTRAVENOUS
  Filled 2015-08-20 (×5): qty 1

## 2015-08-20 MED ORDER — DEXAMETHASONE SODIUM PHOSPHATE 4 MG/ML IJ SOLN
INTRAMUSCULAR | Status: DC | PRN
  Administered 2015-08-20: 8 mg via INTRAVENOUS

## 2015-08-20 MED ORDER — PROMETHAZINE HCL 25 MG/ML IJ SOLN: 6.2500 mg | INTRAMUSCULAR | Status: DC | PRN

## 2015-08-20 SURGICAL SUPPLY — 35 items
ALCOHOL 70% 16 OZ (MISCELLANEOUS) ×3 IMPLANT
ATTRACTOMAT 16X20 MAGNETIC DRP (DRAPES) ×3 IMPLANT
BLADE SURG 15 STRL LF DISP TIS (BLADE) ×2 IMPLANT
BLADE SURG 15 STRL SS (BLADE) ×4
COVER SURGICAL LIGHT HANDLE (MISCELLANEOUS) ×3 IMPLANT
GAUZE PACKING FOLDED 2  STR (GAUZE/BANDAGES/DRESSINGS) ×2
GAUZE PACKING FOLDED 2 STR (GAUZE/BANDAGES/DRESSINGS) ×1 IMPLANT
GAUZE SPONGE 4X4 16PLY XRAY LF (GAUZE/BANDAGES/DRESSINGS) ×6 IMPLANT
GLOVE BIOGEL PI IND STRL 6 (GLOVE) IMPLANT
GLOVE BIOGEL PI INDICATOR 6 (GLOVE)
GLOVE SURG ORTHO 8.0 STRL STRW (GLOVE) ×3 IMPLANT
GLOVE SURG SS PI 6.0 STRL IVOR (GLOVE) IMPLANT
GOWN STRL REUS W/ TWL LRG LVL3 (GOWN DISPOSABLE) ×1 IMPLANT
GOWN STRL REUS W/TWL 2XL LVL3 (GOWN DISPOSABLE) ×3 IMPLANT
GOWN STRL REUS W/TWL LRG LVL3 (GOWN DISPOSABLE) ×2
HEMOSTAT SURGICEL 2X14 (HEMOSTASIS) IMPLANT
KIT BASIN OR (CUSTOM PROCEDURE TRAY) ×3 IMPLANT
KIT ROOM TURNOVER OR (KITS) ×3 IMPLANT
MANIFOLD NEPTUNE WASTE (CANNULA) ×3 IMPLANT
NEEDLE BLUNT 16X1.5 OR ONLY (NEEDLE) ×3 IMPLANT
NS IRRIG 1000ML POUR BTL (IV SOLUTION) ×3 IMPLANT
PACK EENT II TURBAN DRAPE (CUSTOM PROCEDURE TRAY) ×3 IMPLANT
PAD ARMBOARD 7.5X6 YLW CONV (MISCELLANEOUS) ×3 IMPLANT
SPONGE GAUZE 4X4 12PLY STER LF (GAUZE/BANDAGES/DRESSINGS) ×3 IMPLANT
SPONGE SURGIFOAM ABS GEL 100 (HEMOSTASIS) IMPLANT
SPONGE SURGIFOAM ABS GEL 12-7 (HEMOSTASIS) IMPLANT
SPONGE SURGIFOAM ABS GEL SZ50 (HEMOSTASIS) IMPLANT
SUCTION FRAZIER TIP 10 FR DISP (SUCTIONS) ×3 IMPLANT
SUT CHROMIC 3 0 PS 2 (SUTURE) ×12 IMPLANT
SUT CHROMIC 4 0 P 3 18 (SUTURE) IMPLANT
SYR 50ML SLIP (SYRINGE) ×3 IMPLANT
TOWEL OR 17X26 10 PK STRL BLUE (TOWEL DISPOSABLE) ×3 IMPLANT
TUBE CONNECTING 12'X1/4 (SUCTIONS) ×1
TUBE CONNECTING 12X1/4 (SUCTIONS) ×2 IMPLANT
YANKAUER SUCT BULB TIP NO VENT (SUCTIONS) ×3 IMPLANT

## 2015-08-20 NOTE — Progress Notes (Signed)
Patient ID: [name], female   DOB: 06/14/1961, 54 y.o.   MRN: [id number] Day of Surgery  Subjective: Pt down getting ready to get her teeth extracted.  States that her solid diet made her pain worse yesterday.  Objective: Vital signs in last 24 hours: Temp:  [98.6 F (37 C)-100 F (37.8 C)] 100 F (37.8 C) (12/16 0532) Pulse Rate:  [61-90] 90 (12/16 0532) Resp:  [18-19] 19 (12/16 0532) BP: (96-139)/(52-75) 139/75 mmHg (12/16 0532) SpO2:  [100 %] 100 % (12/16 0532) Last BM Date: 08/19/15  Intake/Output from previous day: 12/15 0701 - 12/16 0700 In: 3697 [I.V.:3697] Out: -  Intake/Output this shift:    PE: Abd: soft, minimally tender, but says she just got pain medication, ND, +BS Heart: regular, + murmur Lungs: CTAB  Lab Results:  No results for input(s): WBC, HGB, HCT, PLT in the last 72 hours. BMET No results for input(s): NA, K, CL, CO2, GLUCOSE, BUN, CREATININE, CALCIUM in the last 72 hours. PT/INR No results for input(s): LABPROT, INR in the last 72 hours. CMP     Component Value Date/Time   NA 137 08/16/2015 0557   K 3.9 08/16/2015 0557   CL 100* 08/16/2015 0557   CO2 30 08/16/2015 0557   GLUCOSE 101* 08/16/2015 0557   BUN <5* 08/16/2015 0557   CREATININE 0.84 08/16/2015 0557   CREATININE 0.72 01/29/2015 1216   CALCIUM 8.8* 08/16/2015 0557   PROT 6.5 08/15/2015 1603   ALBUMIN 3.1* 08/15/2015 1603   AST 51* 08/15/2015 1603   ALT 47 08/15/2015 1603   ALKPHOS 99 08/15/2015 1603   BILITOT 0.6 08/15/2015 1603   GFRNONAA >60 08/16/2015 0557   GFRNONAA >89 01/29/2015 1216   GFRAA >60 08/16/2015 0557   GFRAA >89 01/29/2015 1216   Lipase     Component Value Date/Time   LIPASE 23 08/15/2015 1603       Studies/Results: No results found.  Anti-infectives: Anti-infectives    Start     Dose/Rate Route Frequency Ordered Stop   08/18/15 0930  [MAR Hold]  metroNIDAZOLE (FLAGYL) tablet 500 mg     (MAR Hold since 08/20/15 0640)   500 mg Oral 3 times  per day 08/18/15 0922     08/17/15 1200  [MAR Hold]  levofloxacin (LEVAQUIN) tablet 750 mg     (MAR Hold since 08/20/15 0640)   750 mg Oral Daily 08/17/15 1105     08/16/15 0400  metroNIDAZOLE (FLAGYL) IVPB 500 mg  Status:  Discontinued     500 mg 100 mL/hr over 60 Minutes Intravenous Every 8 hours 08/15/15 2224 08/18/15 0922   08/15/15 2200  aztreonam (AZACTAM) 2 g in dextrose 5 % 50 mL IVPB  Status:  Discontinued     2 g 100 mL/hr over 30 Minutes Intravenous 3 times per day 08/15/15 1942 08/19/15 1039   08/15/15 1930  metroNIDAZOLE (FLAGYL) IVPB 500 mg     500 mg 100 mL/hr over 60 Minutes Intravenous  Once 08/15/15 1927 08/15/15 2107       Assessment/Plan  1. Diverticulitis with microperf -will decrease diet.  Will be on clears after her procedure today and will slowly advance her diet over the next couple days again as she is able to tolerate -cont on levaquin/flagyl -patient still using pain meds quite a bit.  Would clearly like for her abdominal pain to improve enough prior to going home that she isn't using pain meds near as much 2. Dental caries -for teeth extraction  today . DVT prophylaxis -SCDs/lovenox on hold today for OR   LOS: 5 days    Karolynn Infantino E 08/20/2015, 7:30 AM Pager: [phone]

## 2015-08-20 NOTE — Anesthesia Preprocedure Evaluation (Addendum)
Anesthesia Evaluation  Patient identified by MRN, date of birth, ID band Patient awake    Reviewed: Allergy & Precautions, NPO status , Patient's Chart, lab work & pertinent test results, reviewed documented beta blocker date and time   History of Anesthesia Complications (+) PROLONGED EMERGENCE and history of anesthetic complications  Airway Mallampati: II  TM Distance: >3 FB Neck ROM: Full    Dental  (+) Dental Advisory Given, Poor Dentition, Missing   Pulmonary asthma , Current Smoker,  Chronic bronchitis   Pulmonary exam normal breath sounds clear to auscultation       Cardiovascular hypertension, Pt. on medications and Pt. on home beta blockers (-) angina(-) CAD and (-) Past MI Normal cardiovascular exam+ Valvular Problems/Murmurs  Rhythm:Regular Rate:Normal + Systolic murmurs Hasn't taken anti-hypertensives in 2 days due to hypotension   Neuro/Psych  Headaches, PSYCHIATRIC DISORDERS Anxiety Depression Bipolar Disorder    GI/Hepatic negative GI ROS, Neg liver ROS, GERD  Medicated,  Endo/Other  Hypothyroidism Obesity   Renal/GU negative Renal ROS     Musculoskeletal  (+) Arthritis , Osteoarthritis,    Abdominal   Peds  Hematology  (+) Blood dyscrasia, anemia ,   Anesthesia Other Findings Day of surgery medications reviewed with the patient.  Reproductive/Obstetrics                           Anesthesia Physical Anesthesia Plan  ASA: III  Anesthesia Plan: General   Post-op Pain Management:    Induction: Intravenous  Airway Management Planned: Nasal ETT  Additional Equipment:   Intra-op Plan:   Post-operative Plan: Extubation in OR  Informed Consent: I have reviewed the patients History and Physical, chart, labs and discussed the procedure including the risks, benefits and alternatives for the proposed anesthesia with the patient or authorized representative who has indicated  his/her understanding and acceptance.   Dental advisory given  Plan Discussed with: CRNA  Anesthesia Plan Comments: (Risks/benefits of general anesthesia discussed with patient including risk of damage to teeth, lips, gum, and tongue, nausea/vomiting, allergic reactions to medications, and the possibility of heart attack, stroke and death.  All patient questions answered.  Patient wishes to proceed.)        Anesthesia Quick Evaluation

## 2015-08-20 NOTE — Op Note (Signed)
OPERATIVE REPORT  Patient:            [name] Date of Birth:  04/11/1961 MRN:                [id number]   DATE OF PROCEDURE:  08/20/2015  PREOPERATIVE DIAGNOSES: 1. Diverticulitis  2. Right facial swelling 3. Acute pulpitis 4. Periapical abscess 5. Chronic apical periodontitis 6. Retained root segments 7. Dental caries  POSTOPERATIVE DIAGNOSES: 1. Diverticulitis  2. Right facial swelling 3. Acute pulpitis 4. Periapical abscess 5. Chronic apical periodontitis 6. Retained root segments 7. Dental caries  OPERATIONS: 1. Multiple extraction of tooth numbers 1 through 12, 14, 20 through 27, and 29 2. 4 Quadrants of alveoloplasty   SURGEON: Charlynne Panderonald F. Elly Haffey, DDS  ANESTHESIA: General anesthesia via nasoendotracheal tube.  MEDICATIONS: 1. Antibiotic therapy per previous orders  2. Local anesthesia with a total utilization of 6 carpules each containing 34 mg of lidocaine with 0.017 mg of epinephrine as well as 2 carpules each containing 9 mg of bupivacaine with 0.009 mg of epinephrine.  SPECIMENS: There are 22 teeth that were discarded.  DRAINS: None  CULTURES: None  COMPLICATIONS: None   ESTIMATED BLOOD LOSS: 150 mLs.  INTRAVENOUS FLUIDS: 700 mLs of Lactated ringers solution.  INDICATIONS: The patient was recently diagnosed with diverticulitis.  A dental consultation was then requested to evaluate right facial swelling.  The patient was examined and treatment planned for extraction of remaining teeth with alveoloplasty in the operating room with general anesthesia.  This treatment plan was formulated to decrease the risks and complications associated with dental infection from further affecting the patient's systemic health.  OPERATIVE FINDINGS: Patient was examined operating room number 10.  The teeth were identified for extraction. The patient was noted be affected by right facial swelling, history of acute pulpitis, chronic apical periodontitis,  periapical abscess, dental caries, multiple retained root segments.   DESCRIPTION OF PROCEDURE: Patient was brought to the main operating room number 10. Patient was then placed in the supine position on the operating table. General anesthesia was then induced per the anesthesia team. The patient was then prepped and draped in the usual manner for dental medicine procedure. A timeout was performed. The patient was identified and procedures were verified. A throat pack was placed at this time. The oral cavity was then thoroughly examined with the findings noted above. The patient was then ready for dental medicine procedure as follows:  Local anesthesia was then administered sequentially with a total utilization of 6 carpules each containing 34 mg of lidocaine with 0.017 mg of epinephrine as well as 2 carpules  each containing 9 mg bupivacaine with 0.009 mg of epinephrine.  The Maxillary left and right quadrants were first approached. Anesthesia was then delivered utilizing infiltration with lidocaine with epinephrine. A #15 blade incision was then made from the maxillary right tuberosity and extended to the distal of the maxillary left tuberosity.  A  surgical flap was then carefully reflected. The teeth were then subluxated with a series of straight elevators. Tooth and root segments associated with tooth numbers 1, 2, 3, 4, 5, 6, 7, 8, 9, 10, 11, 12, and 14 were then removed with a 150 forceps and rongeurs without complication. Alveoloplasty was then performed utilizing a ronguers and bone file. The surgical site was then irrigated with copious amounts of sterile saline. The tissues were approximated and trimmed appropriately. The maxillary right surgical site was then closed from the maxillary right tuberosity and extended  the mesial #8 utilizing 3-0 chromic gut suture in a continuous interrupted suture technique 1. The maxillary left surgical site was then closed from the maxillary left tuberosity and  extended the mesial #9 utilizing 3-0 chromic gut suture in a continuous interrupted suture technique 1.  At this point time, the mandibular quadrants were approached. The patient was given bilateral inferior alveolar nerve blocks and long buccal nerve blocks utilizing the bupivacaine with epinephrine. Further infiltration was then achieved utilizing the lidocaine with epinephrine. A 15 blade incision was then made from the distal of number 18 and extended to the distal of #30.  A surgical flap was then carefully reflected. The lower teeth were then subluxated with a series of straight elevators. Tooth numbers  20, 21, 22, 23, 24, 25, 26, 27, 29 were then removed with a 151 forceps and/or a rongeurs as indicated with no complications. Alveoloplasty was then performed utilizing a rongeurs and bone file. The tissues were approximated and trimmed appropriately. The surgical sites were then irrigated with copious amounts of sterile saline. The mandibular left surgical site was then closed from the distal of  18 and extended the mesial #24 utilizing 3-0 chromic gut suture in a continuous interrupted suture technique 1.  The mandibular right surgical site was then closed from the distal of 30 and extended the mesial #25 utilizing 3-0 chromic gut suture in a continuous interrupted suture technique 1.   At this point time, the entire mouth was irrigated with copious amounts of sterile saline. The patient was examined for complications, seeing none, the dental medicine procedure was deemed to be complete. The throat pack was removed at this time. An oral airway was then placed at the request of the anesthesia team. A series of 4 x 4 gauze were placed in the mouth to aid hemostasis. The patient was then handed over to the anesthesia team for final disposition. After an appropriate amount of time, the patient was extubated and taken to the postanesthsia care unit in good condition. All counts were correct for the dental  medicine procedure.  Patient will be seen in follow-up for evaluation for suture removal in January 2017. Sutures may dissolve on their own, however. Patient is a contact dental medicine if she has problems early next week for reevaluation prior to scheduled office closure for the holidays. Patient is to be discharged by [name] at the discretion.  Percocet 5/325 is an appropriate dental pain medication for the patient. Oral antibiotic therapy will be continued at the discretion of [facility].   Charlynne Pander, DDS.

## 2015-08-20 NOTE — Progress Notes (Signed)
Phillip RN at bedside to receive report

## 2015-08-20 NOTE — Progress Notes (Signed)
PRE-OPERATIVE NOTE:  08/20/2015   [name] [id number]  VITALS: BP 139/75 mmHg  Pulse 90  Temp(Src) 100 F (37.8 C) (Oral)  Resp 19  Ht 5\' 7"  (1.702 m)  Wt 198 lb (89.812 kg)  BMI 31.00 kg/m2  SpO2 100%  Lab Results  Component Value Date   WBC 9.5 08/17/2015   HGB 11.7* 08/17/2015   HCT 38.4 08/17/2015   MCV 90.1 08/17/2015   PLT 239 08/17/2015   BMET    Component Value Date/Time   NA 137 08/16/2015 0557   K 3.9 08/16/2015 0557   CL 100* 08/16/2015 0557   CO2 30 08/16/2015 0557   GLUCOSE 101* 08/16/2015 0557   BUN <5* 08/16/2015 0557   CREATININE 0.84 08/16/2015 0557   CREATININE 0.72 01/29/2015 1216   CALCIUM 8.8* 08/16/2015 0557   GFRNONAA >60 08/16/2015 0557   GFRNONAA >89 01/29/2015 1216   GFRAA >60 08/16/2015 0557   GFRAA >89 01/29/2015 1216    Lab Results  Component Value Date   INR 1.10 11/24/2014   No results found for: PTT   [name] presents for multiple dental extractions with alveoloplasty in the operating room with general anesthesia.    SUBJECTIVE: The patient denies any acute medical or dental changes and agrees to proceed with treatment as planned.  EXAM: No sign of acute dental changes.  ASSESSMENT: Patient is affected by history of right facial swelling, chronic apical periodontitis, dental caries, multiple retained root segments, and loose teeth.   PLAN: Patient agrees to proceed with treatment as planned in the operating room as previously discussed and accepts the risks, benefits, and complications of the proposed treatment. Patient is aware of the risk for bleeding, bruising, swelling, infection, pain, nerve damage, soft tissue damage, sinus involvement, root tip fracture, mandible fracture, and the risks of complications associated with the anesthesia. Patient also is aware of the potential for other complications not mentioned above.   Charlynne Panderonald F. Eugena Rhue, DDS

## 2015-08-20 NOTE — Progress Notes (Signed)
Initial Nutrition Assessment  DOCUMENTATION CODES:   Obesity unspecified  INTERVENTION:   -RD will follow for diet advancement and supplement diet as appropriate  NUTRITION DIAGNOSIS:   Inadequate oral intake related to inability to eat as evidenced by NPO status.  GOAL:   Patient will meet greater than or equal to 90% of their needs  MONITOR:   Diet advancement, Labs, Weight trends, Skin, I & O's  REASON FOR ASSESSMENT:   Consult Assessment of nutrition requirement/status (edentulous)  ASSESSMENT:   [age] yof who has history of diverticulitis admitted to  treated conservatively. She has since followed up and underwent colonoscopy by Dr Elnoria HowardHung. She has the report from 8/16 that shows only some scattered diverticuli. Three days ago developed epigastric and umbilical abdominal pain that has now progressed to the llq. She had some pain with urination and having a bm. She has been having bms and flatus. She states she was having low grade fevers and some to 102. She presents after undergoing ct that shows diverticular disease .  Pt admitted with diverticulitis.   S/p Procedures on 08/20/15: 1. Multiple extraction of tooth numbers 1 through 12, 14, 20 through 27, and 29 2. 4 Quadrants of alveoloplasty  Per dental note, 22 total teeth were extracted.   Spoke with pt's significant other at bedside, who reports that pt just recently returned from surgery. He reports that pt was tolerating liquid diet well prior to surgery, but confirmed mouth pain due to dental issues. PTA she had a good appetite and he denies pt has had weight loss.   Nutrition-Focused physical exam completed. Findings are no fat depletion, no muscle depletion, and no edema.   Pt's significant other is most concerned about when pt diet will be advanced. This RD informed that diet is typically advanced as tolerated and liquids are usually initiated first. He was accepting of explanation and had no further  nutrition related concerns. He expressed appreciation for visit.   Labs reviewed.   Diet Order:  Diet NPO time specified  Skin:  Reviewed, no issues  Last BM:  08/19/15  Height:   Ht Readings from Last 1 Encounters:  08/15/15 5\' 7"  (1.702 m)    Weight:   Wt Readings from Last 1 Encounters:  08/15/15 198 lb (89.812 kg)    Ideal Body Weight:  61.4 kg  BMI:  Body mass index is 31 kg/(m^2).  Estimated Nutritional Needs:   Kcal:  1500-1700  Protein:  75-85 grams  Fluid:  1.5-1.7 L  EDUCATION NEEDS:   Education needs addressed  Zeriyah Wain A. Mayford KnifeWilliams, RD, LDN, CDE Pager: [phone] After hours Pager: [phone]

## 2015-08-20 NOTE — Transfer of Care (Signed)
Immediate Anesthesia Transfer of Care Note  Patient: Brooke JewelsSandra L Hester  Procedure(s) Performed: Procedure(s): Extraction of tooth #'s 1-12,14,20-27, 29 with alveoloplasty (N/A)  Patient Location: PACU  Anesthesia Type:General  Level of Consciousness: awake, alert , oriented and patient cooperative  Airway & Oxygen Therapy: Patient Spontanous Breathing and Patient connected to face mask oxygen  Post-op Assessment: Report given to RN and Post -op Vital signs reviewed and stable  Post vital signs: Reviewed and stable  Last Vitals:  Filed Vitals:   08/20/15 0532 08/20/15 0930  BP: 139/75 149/71  Pulse: 90 70  Temp: 37.8 C 36.5 C  Resp: 19 8    Complications: No apparent anesthesia complications

## 2015-08-20 NOTE — Anesthesia Postprocedure Evaluation (Signed)
Anesthesia Post Note  Patient: [name]  Procedure(s) Performed: Procedure(s) (LRB): Extraction of tooth #'s 1-12,14,20-27, 29 with alveoloplasty (N/A)  Patient location during evaluation: PACU Anesthesia Type: General Level of consciousness: awake and alert, awake and oriented Pain management: pain level controlled Vital Signs Assessment: post-procedure vital signs reviewed and stable Respiratory status: spontaneous breathing, nonlabored ventilation, respiratory function stable and patient connected to nasal cannula oxygen Cardiovascular status: blood pressure returned to baseline and stable Postop Assessment: no signs of nausea or vomiting Anesthetic complications: no    Last Vitals:  Filed Vitals:   08/20/15 1049 08/20/15 1130  BP:  113/63  Pulse:  73  Temp: 36.5 C 36.4 C  Resp:  20    Last Pain:  Filed Vitals:   08/20/15 1414  PainSc: 3                  Cecile HearingStephen Edward Kameryn Davern

## 2015-08-20 NOTE — Discharge Instructions (Signed)

## 2015-08-21 LAB — CBC
HCT: 32.1 % — ABNORMAL LOW (ref 36.0–46.0)
Hemoglobin: 10.4 g/dL — ABNORMAL LOW (ref 12.0–15.0)
MCH: 28.7 pg (ref 26.0–34.0)
MCHC: 32.4 g/dL (ref 30.0–36.0)
MCV: 88.7 fL (ref 78.0–100.0)
Platelets: 333 10*3/uL (ref 150–400)
RBC: 3.62 MIL/uL — ABNORMAL LOW (ref 3.87–5.11)
RDW: 13.2 % (ref 11.5–15.5)
WBC: 10.2 10*3/uL (ref 4.0–10.5)

## 2015-08-21 NOTE — Progress Notes (Signed)
Patient ID: [name], female   DOB: October 13, 1960, 54 y.o.   MRN: [id number] [facility] Progress Note:   1 Day Post-Op  Subjective: Mental status is alert and without complaints except hunger Objective: Vital signs in last 24 hours: Temp:  [97.6 F (36.4 C)-98.6 F (37 C)] 98.6 F (37 C) (12/17 0432) Pulse Rate:  [55-109] 55 (12/17 0432) Resp:  [9-20] 16 (12/17 0432) BP: (113-139)/(60-76) 119/63 mmHg (12/17 0432) SpO2:  [95 %-100 %] 98 % (12/17 0432)  Intake/Output from previous day: 12/16 0701 - 12/17 0700 In: 1800 [I.V.:1800] Out: 150 [Blood:150] Intake/Output this shift:    Physical Exam: Work of breathing is normal.  Expected jaw swelling from teeth extraction  Lab Results:  Results for orders placed or performed during the hospital encounter of 08/15/15 (from the past 48 hour(s))  Surgical pcr screen     Status: None   Collection Time: 08/19/15  9:01 PM  Result Value Ref Range   MRSA, PCR NEGATIVE NEGATIVE   Staphylococcus aureus NEGATIVE NEGATIVE    Comment:        The Xpert SA Assay (FDA approved for NASAL specimens in patients over 74 years of age), is one component of a comprehensive surveillance program.  Test performance has been validated by [facility] for patients greater than or equal to 10 year old. It is not intended to diagnose infection nor to guide or monitor treatment.   CBC     Status: Abnormal   Collection Time: 08/21/15  4:07 AM  Result Value Ref Range   WBC 10.2 4.0 - 10.5 K/uL   RBC 3.62 (L) 3.87 - 5.11 MIL/uL   Hemoglobin 10.4 (L) 12.0 - 15.0 g/dL   HCT 40.9 (L) 81.1 - 91.4 %   MCV 88.7 78.0 - 100.0 fL   MCH 28.7 26.0 - 34.0 pg   MCHC 32.4 30.0 - 36.0 g/dL   RDW 78.2 95.6 - 21.3 %   Platelets 333 150 - 400 K/uL    Radiology/Results: No results found.  Anti-infectives: Anti-infectives    Start     Dose/Rate Route Frequency Ordered Stop   08/18/15 0930  metroNIDAZOLE (FLAGYL) tablet 500 mg     500 mg Oral 3 times  per day 08/18/15 0922     08/17/15 1200  levofloxacin (LEVAQUIN) tablet 750 mg     750 mg Oral Daily 08/17/15 1105     08/16/15 0400  metroNIDAZOLE (FLAGYL) IVPB 500 mg  Status:  Discontinued     500 mg 100 mL/hr over 60 Minutes Intravenous Every 8 hours 08/15/15 2224 08/18/15 0922   08/15/15 2200  aztreonam (AZACTAM) 2 g in dextrose 5 % 50 mL IVPB  Status:  Discontinued     2 g 100 mL/hr over 30 Minutes Intravenous 3 times per day 08/15/15 1942 08/19/15 1039   08/15/15 1930  metroNIDAZOLE (FLAGYL) IVPB 500 mg     500 mg 100 mL/hr over 60 Minutes Intravenous  Once 08/15/15 1927 08/15/15 2107      Assessment/Plan: Problem List: Patient Active Problem List   Diagnosis Date Noted  . Diverticulitis 08/15/2015  . Diverticulitis of large intestine with abscess  11/23/2014  . Acute diverticulitis 11/23/2014  . Leukocytosis 11/17/2014  . Essential hypertension 11/17/2014  . Heart murmur   . Chronic bronchitis (HCC)   . Hypothyroidism   . Anxiety   . Depression   . Mental disorder   . GERD (gastroesophageal reflux disease)   . Simple chronic bronchitis (  HCC)     Will restart clear liquid diet.  Hopeful discharge soon.   1 Day Post-Op    LOS: 6 days   Matt B. Daphine DeutscherMartin, MD, [facility]ACS  [facility] [phone] beeper [phone]  08/21/2015 9:36 AM

## 2015-08-22 LAB — CREATININE, SERUM
Creatinine, Ser: 0.74 mg/dL (ref 0.44–1.00)
GFR calc Af Amer: >60 mL/min (ref >60)
GFR calc non Af Amer: >60 mL/min (ref >60)

## 2015-08-22 MED ORDER — LEVOFLOXACIN 750 MG PO TABS: 750.0000 mg | ORAL_TABLET | Freq: Every day | ORAL | Status: DC

## 2015-08-22 MED ORDER — OXYCODONE-ACETAMINOPHEN 5-325 MG PO TABS: 1.0000 | ORAL_TABLET | ORAL | Status: DC | PRN

## 2015-08-22 MED ORDER — METRONIDAZOLE 500 MG PO TABS: 500.0000 mg | ORAL_TABLET | Freq: Three times a day (TID) | ORAL | Status: DC

## 2015-08-22 MED ORDER — CHLORHEXIDINE GLUCONATE 0.12 % MT SOLN: 15.0000 mL | Freq: Two times a day (BID) | OROMUCOSAL | Status: DC

## 2015-08-22 NOTE — Discharge Summary (Signed)
Physician Discharge Summary  Patient ID: [name] MRN: [id number] DOB/AGE: February 27, 1961 54 y.o.  Admit date: 08/15/2015 Discharge date: 08/22/2015  Admission Diagnoses:  diverticulits and bad teeth  Discharge Diagnoses:  same  Active Problems:   Diverticulitis   Surgery:  Total mouth extraction  Discharged Condition: improved  Hospital Course:   Admitted with complications of diverticulitis and bad dentition.  Was treated with IV antbiotics and then oral antibiotics and meanwhile had a total mouth extraction by Dr. Kristin Bruins:  Specifically to use dental jargon it was--1. Multiple extraction of tooth numbers 1 through 12, 14, 20 through 27, and 29 2. 4 Quadrants of alveoloplasty.  She has faired well and will be sent home on Peridex, Levoquin, Flagyl, and Oxycodone.    Consults: Dr. Kristin Bruins  Significant Diagnostic Studies: CT    Discharge Exam: Blood pressure 100/51, pulse 66, temperature 98.3 F (36.8 C), temperature source Oral, resp. rate 17, height  (1.702 m), weight 89.812 kg (198 lb), SpO2 100 %. Doing well.  Less jaw swelling  Disposition: 01-Home or Self Care  Discharge Instructions    Diet - low sodium heart healthy    Complete by:  As directed      Discharge instructions    Complete by:  As directed   Use peridex as prescribed to promote oral hygiene     Increase activity slowly    Complete by:  As directed             Medication List    TAKE these medications        albuterol 108 (90 BASE) MCG/ACT inhaler  Commonly known as:  PROVENTIL HFA;VENTOLIN HFA  Inhale 2 puffs into the lungs every 4 (four) hours as needed for wheezing or shortness of breath.     amLODipine 10 MG tablet  Commonly known as:  NORVASC  Take 1 tablet (10 mg total) by mouth daily. Take this instead of your LOTREL until you see your regular doctor.     atenolol 50 MG tablet  Commonly known as:  TENORMIN  TAKE ONE TABLET   BY MOUTH   DAILY     chlorhexidine 0.12 %  solution  Commonly known as:  PERIDEX  Use as directed 15 mLs in the mouth or throat 2 (two) times daily.     clorazepate 7.5 MG tablet  Commonly known as:  TRANXENE  Take 7.5 mg by mouth 2 (two) times daily.     cyclobenzaprine 10 MG tablet  Commonly known as:  FLEXERIL  Take 1 tablet (10 mg total) by mouth at bedtime.     diphenhydrAMINE 25 MG tablet  Commonly known as:  BENADRYL  Take 1 tablet (25 mg total) by mouth every 6 (six) hours.     docusate sodium 100 MG capsule  Commonly known as:  COLACE  Take 100 mg by mouth daily.     hydrocortisone cream 1 %  Apply 1 application topically 2 (two) times daily.     levofloxacin 750 MG tablet  Commonly known as:  LEVAQUIN  Take 1 tablet (750 mg total) by mouth daily.     levothyroxine 125 MCG tablet  Commonly known as:  SYNTHROID, LEVOTHROID  Take 125 mcg by mouth daily.     metroNIDAZOLE 500 MG tablet  Commonly known as:  FLAGYL  Take 1 tablet (500 mg total) by mouth every 8 (eight) hours.     oxyCODONE-acetaminophen 5-325 MG tablet  Commonly known as:  PERCOCET/ROXICET  Take  1-2 tablets by mouth every 4 (four) hours as needed for moderate pain.     QUEtiapine 50 MG Tb24 24 hr tablet  Commonly known as:  SEROQUEL XR  Take 50 mg by mouth at bedtime.     triamcinolone ointment 0.1 %  Commonly known as:  KENALOG  Apply 1 application topically daily. All over.           Follow-up Information    Follow up with Cindra Evesonald Kulinski, DDS On 09/06/2015.   Specialty:  Dentistry   Why:  For suture removal   Contact information:   [address] [place] [address] [phone]       [facility] Schedule an appointment as soon as possible for a visit in 3 weeks.   Specialty:  General Surgery   Contact information:   [address] [place] [id number] [phone]      Signed: Valarie MerinoMARTIN,Kayven Aldaco B 08/22/2015, 8:56 AM

## 2015-08-22 NOTE — Progress Notes (Signed)
Stanford BreedSandra Parkin to be D/C'd  per MD order. Discussed with the patient and all questions fully answered.  VSS, Skin clean, dry and intact without evidence of skin break down, no evidence of skin tears noted.  IV catheter discontinued intact. Site without signs and symptoms of complications. Dressing and pressure applied.  An After Visit Summary was printed and given to the patient. Patient received prescription.  D/c education completed with patient/family including follow up instructions, medication list, d/c activities limitations if indicated, with other d/c instructions as indicated by MD - patient able to verbalize understanding, all questions fully answered.   Patient instructed to return to ED, call 911, or call MD for any changes in condition.   Patient to be escorted via WC, and D/C home via private auto.

## 2015-08-23 ENCOUNTER — Encounter (HOSPITAL_COMMUNITY): Payer: Self-pay | Admitting: Dentistry

## 2015-08-23 ENCOUNTER — Other Ambulatory Visit: Payer: Self-pay | Admitting: Internal Medicine

## 2015-09-07 ENCOUNTER — Ambulatory Visit (HOSPITAL_COMMUNITY): Payer: Self-pay | Admitting: Dentistry

## 2015-09-07 ENCOUNTER — Encounter (HOSPITAL_COMMUNITY): Payer: Self-pay | Admitting: Dentistry

## 2015-09-07 VITALS — BP 141/72 | HR 70 | Temp 98.7°F

## 2015-09-07 DIAGNOSIS — K08109 Complete loss of teeth, unspecified cause, unspecified class: Secondary | ICD-10-CM

## 2015-09-07 DIAGNOSIS — K0820 Unspecified atrophy of edentulous alveolar ridge: Secondary | ICD-10-CM

## 2015-09-07 DIAGNOSIS — K5792 Diverticulitis of intestine, part unspecified, without perforation or abscess without bleeding: Secondary | ICD-10-CM

## 2015-09-07 NOTE — Progress Notes (Signed)
POST OPERATIVE NOTE:  09/07/2015   [name] [id number]  VITALS: BP 141/72 mmHg  Pulse 70  Temp(Src) 98.7 F (37.1 C) (Oral)  LABS:  Lab Results  Component Value Date   WBC 10.2 08/21/2015   HGB 10.4* 08/21/2015   HCT 32.1* 08/21/2015   MCV 88.7 08/21/2015   PLT 333 08/21/2015   BMET    Component Value Date/Time   NA 137 08/16/2015 0557   K 3.9 08/16/2015 0557   CL 100* 08/16/2015 0557   CO2 30 08/16/2015 0557   GLUCOSE 101* 08/16/2015 0557   BUN <5* 08/16/2015 0557   CREATININE 0.74 08/22/2015 0524   CREATININE 0.72 01/29/2015 1216   CALCIUM 8.8* 08/16/2015 0557   GFRNONAA >60 08/22/2015 0524   GFRNONAA >89 01/29/2015 1216   GFRAA >60 08/22/2015 0524   GFRAA >89 01/29/2015 1216    Lab Results  Component Value Date   INR 1.10 11/24/2014   No results found for: PTT   Brooke JewelsSandra L Hester is status post extraction remaining teeth with alveoloplasty in the operating room with general anesthesia and 08/20/2015.  The patient now presents for evaluation of healing and suture removal as needed.  SUBJECTIVE: Patient denies having any problems from her dental extraction sites. Patient does not think she has any sutures that remain.   EXAM: There is no sign of infection, heme, or ooze. A small bone spicule was removed from the midline in the area of 24/25 alveolar ridge without complications. Patient is healing in by generalized primary closure.  The patient is now edentulous.  PROCEDURE: The patient was given a chlorhexidine gluconate rinse for 30 seconds. Bony spicule was removed from the anterior alveolar ridge area #24/25 as above. No sutures needed to be removed. Patient tolerated the procedure well.  ASSESSMENT: Post operative course is consistent with dental procedures performed in the operating room with general anesthesia. The patient is edentulous.  PLAN: 1. Continue chlorhexidine rinses as needed to aid healing. 2. Use salt water rinses as needed to  aid healing. 3. Brush tongue daily. 4. Proceed with fabrication of upper and lower complete dentures after prior approval is received from Medicaid. 5. Advance diet as tolerated. 6. Call if problems arise with healing.  Charlynne Panderonald F. Benigno Check, DDS

## 2015-09-07 NOTE — Patient Instructions (Signed)
PLAN: 1. Continue chlorhexidine rinses as needed to aid healing. 2. Use salt water rinses as needed to aid healing. 3. Brush tongue daily. 4. Proceed with fabrication of upper and lower complete dentures after prior approval is received from Medicaid. 5. Advance diet as tolerated. 6. Call if problems arise with healing.  Charlynne Panderonald F. Kulinski, DDS

## 2015-09-27 ENCOUNTER — Encounter (HOSPITAL_COMMUNITY): Payer: Self-pay | Admitting: Dentistry

## 2015-09-27 ENCOUNTER — Encounter (INDEPENDENT_AMBULATORY_CARE_PROVIDER_SITE_OTHER): Payer: Self-pay

## 2015-09-27 ENCOUNTER — Ambulatory Visit (HOSPITAL_COMMUNITY): Payer: Medicaid - Dental | Admitting: Dentistry

## 2015-09-27 VITALS — BP 149/70 | HR 76 | Temp 98.4°F

## 2015-09-27 DIAGNOSIS — K08109 Complete loss of teeth, unspecified cause, unspecified class: Secondary | ICD-10-CM

## 2015-09-27 DIAGNOSIS — Z463 Encounter for fitting and adjustment of dental prosthetic device: Secondary | ICD-10-CM

## 2015-09-27 DIAGNOSIS — K0820 Unspecified atrophy of edentulous alveolar ridge: Secondary | ICD-10-CM

## 2015-09-27 DIAGNOSIS — K5792 Diverticulitis of intestine, part unspecified, without perforation or abscess without bleeding: Secondary | ICD-10-CM

## 2015-09-27 NOTE — Progress Notes (Signed)
09/27/2015  Patient Name:   [name] Date of Birth:   Jan 04, 1961 Medical Record Number: [id number]  BP 149/70 mmHg  Pulse 76  Temp(Src) 98.4 F (36.9 C) (Oral)  Michail Jewels presents for start of upper and lower denture fabrication.  Exam: Patient is edentulous. Discussed procedures involved in upper and lower denture fabrication and prognosis for successful ability to wear dentures. Price for dentures confirmed.  Patient agrees to proceed with upper and lower denture fabrication. Procedure:  Upper and lower denture primary impressions in alginate. Lab pour. To [facility] for upper and lower denture custom tray fabrication. RTC for upper and lower denture final impressions.  Charlynne Pander, DDS

## 2015-09-27 NOTE — Patient Instructions (Signed)
Return to clinic as scheduled for continued upper and lower complete denture fabrication. Dr. Kulinski 

## 2015-10-04 ENCOUNTER — Encounter (HOSPITAL_COMMUNITY): Payer: Self-pay | Admitting: Dentistry

## 2015-10-05 ENCOUNTER — Encounter (HOSPITAL_COMMUNITY): Payer: Self-pay | Admitting: Dentistry

## 2015-10-05 ENCOUNTER — Ambulatory Visit (HOSPITAL_COMMUNITY): Payer: Medicaid - Dental | Admitting: Dentistry

## 2015-10-05 VITALS — BP 140/74 | HR 70 | Temp 98.4°F

## 2015-10-05 DIAGNOSIS — K08109 Complete loss of teeth, unspecified cause, unspecified class: Secondary | ICD-10-CM

## 2015-10-05 DIAGNOSIS — Z463 Encounter for fitting and adjustment of dental prosthetic device: Secondary | ICD-10-CM

## 2015-10-05 DIAGNOSIS — K0820 Unspecified atrophy of edentulous alveolar ridge: Secondary | ICD-10-CM

## 2015-10-05 DIAGNOSIS — K5792 Diverticulitis of intestine, part unspecified, without perforation or abscess without bleeding: Secondary | ICD-10-CM

## 2015-10-05 NOTE — Patient Instructions (Signed)
Return to clinic as scheduled for denture fabrication.

## 2015-10-05 NOTE — Progress Notes (Signed)
10/05/2015  Patient Name:   CHENEY GOSCH Date of Birth:   03/09/1961 Medical Record Number: [id number]  BP 140/74 mmHg  Pulse 70  Resp 98  Michail Jewels presents for continued upper and lower complete denture fabrication.  Procedure:  Upper and lower border molding and final impressions in Aquasil. Patient tolerated procedure well. To [facility] for custom baseplates with rims. Return to clinic for upper and lower complete denture jaw relations.  Charlynne Pander, DDS

## 2015-10-12 DIAGNOSIS — K118 Other diseases of salivary glands: Secondary | ICD-10-CM | POA: Insufficient documentation

## 2015-10-13 ENCOUNTER — Encounter (HOSPITAL_COMMUNITY): Payer: Self-pay | Admitting: Dentistry

## 2015-10-13 ENCOUNTER — Ambulatory Visit (HOSPITAL_COMMUNITY): Payer: Medicaid - Dental | Admitting: Dentistry

## 2015-10-13 VITALS — BP 141/69 | HR 66 | Temp 97.9°F

## 2015-10-13 DIAGNOSIS — K08109 Complete loss of teeth, unspecified cause, unspecified class: Secondary | ICD-10-CM

## 2015-10-13 DIAGNOSIS — Z463 Encounter for fitting and adjustment of dental prosthetic device: Secondary | ICD-10-CM

## 2015-10-13 DIAGNOSIS — K5792 Diverticulitis of intestine, part unspecified, without perforation or abscess without bleeding: Secondary | ICD-10-CM

## 2015-10-13 DIAGNOSIS — K0820 Unspecified atrophy of edentulous alveolar ridge: Secondary | ICD-10-CM

## 2015-10-13 NOTE — Progress Notes (Signed)
10/13/2015  Patient Name:   VICTORIAN GUNN Date of Birth:   03/01/1961 Medical Record Number: [id number]  BP 141/69 mmHg  Pulse 66  Temp(Src) 97.9 F (36.6 C) (Oral)  Michail Jewels presents for continued denture fabrication. Orthopantogram taken to evaluate healing and assess amount of bone in maxillary tuberosity area. Patient is edentulous and appears to be healing in well.  Procedure:  Upper and lower denture Jaw relations with aluwax bite registration. Patient agrees to tooth selection of 21X, P, and 10 degree posteriors to match with Portrait A2 shade. This is the best approximation of previous tooth #'s 8 and 9 kept from extraction procedures. There is acceptable clearance of the tuberosity areas noted after bite registration obtained.  Patient tolerated procedure well. RTC for denture wax try in.   Charlynne Pander, DDS

## 2015-10-13 NOTE — Patient Instructions (Signed)
Return to clinic as scheduled for continued upper and lower complete denture fabrication. Dr. Lesslie Mossa 

## 2015-10-25 ENCOUNTER — Encounter (HOSPITAL_COMMUNITY): Payer: Self-pay | Admitting: Dentistry

## 2015-10-25 ENCOUNTER — Ambulatory Visit (HOSPITAL_COMMUNITY): Payer: Medicaid - Dental | Admitting: Dentistry

## 2015-10-25 VITALS — BP 168/78 | HR 69 | Temp 98.1°F

## 2015-10-25 DIAGNOSIS — K08109 Complete loss of teeth, unspecified cause, unspecified class: Secondary | ICD-10-CM

## 2015-10-25 DIAGNOSIS — K5792 Diverticulitis of intestine, part unspecified, without perforation or abscess without bleeding: Secondary | ICD-10-CM

## 2015-10-25 DIAGNOSIS — K0820 Unspecified atrophy of edentulous alveolar ridge: Secondary | ICD-10-CM

## 2015-10-25 DIAGNOSIS — M264 Malocclusion, unspecified: Secondary | ICD-10-CM

## 2015-10-25 DIAGNOSIS — Z463 Encounter for fitting and adjustment of dental prosthetic device: Secondary | ICD-10-CM

## 2015-10-25 NOTE — Patient Instructions (Signed)
Return to clinic as scheduled for continued upper lower complete denture fabrication. Dr. Lalena Salas 

## 2015-10-25 NOTE — Progress Notes (Signed)
10/25/2015  Patient Name:   [name] Date of Birth:   [birth year] Medical Record Number: [id number]  BP 168/78 mmHg  Pulse 69  Temp(Src) 98.1 F (36.7 C) (Oral)  [name] presents for continued upper and lower denture fabrication.  Procedure:  Upper and lower denture wax tryin. Patient has significant class II malocclusion. A lower premolar was dropped from the set up. There is a small diastema between the canine and the premolar to allow for a better functional set up. Patient was made aware of these functional and aesthetic changes. Patient accepts esthetics, phonetics, fit and function. Patient agrees to process "as is" in 50:50 [place] 199. Patient to RTC for  upper and lower denture insertion.  Charlynne Pander, DDS

## 2015-11-01 ENCOUNTER — Other Ambulatory Visit: Payer: Self-pay | Admitting: Internal Medicine

## 2015-11-02 ENCOUNTER — Encounter (HOSPITAL_COMMUNITY): Payer: Self-pay | Admitting: Dentistry

## 2015-11-02 ENCOUNTER — Ambulatory Visit (HOSPITAL_COMMUNITY): Payer: Medicaid - Dental | Admitting: Dentistry

## 2015-11-02 VITALS — BP 137/77 | HR 65 | Temp 98.5°F

## 2015-11-02 DIAGNOSIS — K0820 Unspecified atrophy of edentulous alveolar ridge: Secondary | ICD-10-CM

## 2015-11-02 DIAGNOSIS — R220 Localized swelling, mass and lump, head: Secondary | ICD-10-CM

## 2015-11-02 DIAGNOSIS — Z463 Encounter for fitting and adjustment of dental prosthetic device: Secondary | ICD-10-CM

## 2015-11-02 DIAGNOSIS — M264 Malocclusion, unspecified: Secondary | ICD-10-CM

## 2015-11-02 DIAGNOSIS — K5792 Diverticulitis of intestine, part unspecified, without perforation or abscess without bleeding: Secondary | ICD-10-CM

## 2015-11-02 DIAGNOSIS — K08109 Complete loss of teeth, unspecified cause, unspecified class: Secondary | ICD-10-CM

## 2015-11-02 NOTE — Patient Instructions (Signed)
Instructions for Denture Use and Care  Congratulations, you are on the way to oral rehabilitation!  You have just received a new set of complete or partial dentures.  These prostheses will help to improve both your appearance and chewing ability.  These instructions will help you get adjusted to your dentures as well as care for them properly.  Please read these instructions carefully and completely as soon as you get home.  If you or your caregiver have any questions please notify the [facility] at [phone].  HOW YOUR DENTURES LOOK AND FEEL Soon after you begin wearing your dentures, you may feel that your dentures are too large or even loose.  As our mouth and facial muscles become accustomed to the dentures, these feelings will go away.  You also may feel that you are salivating more than you normally do.  This feeling should go away as you get used to having the dentures in your mouth.  You may bite your cheek or your tongue; this will eventually resolve itself as you wear your dentures.  Some soreness is to be expected, but you should not hurt.  If your mouth hurts, call your dentist.  A denture adhesive may occasionally be necessary to hold your dentures in place more securely.  The dentist will let you know when one is recommended for you.  SPEAKING Wearing dentures will change the sound of your voice initially.  This will be noticed by you more than anyone else.  Bite and swallow before you speak, in order to place your dentures in position so that you may speak more clearly.  Practice speaking by reading aloud or counting from 1 to 100 very slowly and distinctly.  After some practice your mouth will become accustomed to your dentures and you will speak more clearly.  EATING Chewing will definitely be different after you receive your dentures.  With a little practice and patience you should be able to eat just about any kind of food.  Begin by eating small quantities of food  that are cut into small pieces.  Star with soft foods such as eggs, cooked vegetables, or puddings.  As you gain confidence advance  Your diet to whatever texture foods you can tolerate.  DENTURE CARE Dentures can collect plaque and calculus much the same as natural teeth can.  If not removed on a regular basis, your dentures will not look or feel clean, and you will experience denture odor.  It is very important that you remove your dentures at bedtime and clean them thoroughly.  You should: 1. Clean your dentures over a sink full of water so if dropped, breakage will be prevented. 2. Rinse your dentures with cool water to remove any large food particles. 3. Use soap and water or a denture cleanser or paste to clean the dentures.  Do not use regular toothpaste as it may abrade the denture base or teeth. 4. Use a moistened denture brush to clean all surfaces (inside and outside). 5. Rinse thoroughly to remove any remaining soap or denture cleanser. 6. Use a soft bristle toothbrush to gently brush any natural teeth, gums, tongue, and palate at bedtime and before reinserting your dentures. 7. Do not sleep with your dentures in your mouth at night.  Remove your dentures and soak them overnight in a denture cup filled with water or denture solution as recommended by your dentist.  This routine will become second nature and will increase the life and comfort   of your dentures.  Please do not try to adjust these dentures yourself; you could damage them.  FOLLOW-UP You should call or make an appointment with your dentist.  Your dentist would like to see you at least once a year for a check-up and examination. 

## 2015-11-02 NOTE — Progress Notes (Signed)
11/02/2015  Patient Name:   [name] Date of Birth:   [birth year] Medical Record Number: [id number]  BP 137/77 mmHg  Pulse 65  Temp(Src) 98.5 F (36.9 C) (Oral)  [name] presents for insertion of upper and lower complete dentures.  Procedure: Pressure indicating paste was applied to the dentures. Adjustments were made as needed. [place]. Occlusion evaluated and adjustments made as needed for Centric Relation and protrusive strokes. Good esthetics, phonetics, fit, and function noted. Patient accepts results. Post op instructions provided in written and verbal formats on use and care of dentures. Gave patient denture brush and cup. Patient to keep dentures out if sore spots develop. Use salt water rinses as needed to aid healing. Return to clinic as scheduled for denture adjustment.   Call if problems arise before then.  Charlynne Pander, DDS

## 2015-11-08 ENCOUNTER — Ambulatory Visit (HOSPITAL_COMMUNITY): Payer: Medicaid - Dental | Admitting: Dentistry

## 2015-11-08 ENCOUNTER — Encounter (INDEPENDENT_AMBULATORY_CARE_PROVIDER_SITE_OTHER): Payer: Self-pay

## 2015-11-08 ENCOUNTER — Encounter (HOSPITAL_COMMUNITY): Payer: Self-pay | Admitting: Dentistry

## 2015-11-08 ENCOUNTER — Other Ambulatory Visit: Payer: Self-pay | Admitting: Internal Medicine

## 2015-11-08 VITALS — BP 117/69 | HR 68 | Temp 98.2°F

## 2015-11-08 DIAGNOSIS — K08109 Complete loss of teeth, unspecified cause, unspecified class: Secondary | ICD-10-CM

## 2015-11-08 DIAGNOSIS — Z463 Encounter for fitting and adjustment of dental prosthetic device: Secondary | ICD-10-CM

## 2015-11-08 DIAGNOSIS — K0820 Unspecified atrophy of edentulous alveolar ridge: Secondary | ICD-10-CM

## 2015-11-08 DIAGNOSIS — R220 Localized swelling, mass and lump, head: Secondary | ICD-10-CM

## 2015-11-08 DIAGNOSIS — M264 Malocclusion, unspecified: Secondary | ICD-10-CM

## 2015-11-08 NOTE — Patient Instructions (Signed)
Patient to keep dentures out if sore spots develop. Use salt water rinses as needed to aid healing. Return to clinic as scheduled for denture adjustment.   Call if problems arise before then.  [name] F. [name], DDS  

## 2015-11-08 NOTE — Progress Notes (Signed)
11/08/2015  Patient Name:   [name] Date of Birth:   03/09/1961 Medical Record Number: [id number]  BP 117/69 mmHg  Pulse 68  Temp(Src) 98.2 F (36.8 C) (Oral)  [name] presents for evaluation of recently inserted upper lower complete dentures.  SUBJECTIVE: Patient is complaining of some denture irritation to the left mandibular anterior area- lingual aspect. OBJECTIVE: There is no sign of denture irritation or erythema. Procedure: Pressure indicating paste was applied to the dentures. Adjustments were made as needed. [place]. Occlusion evaluated and adjustments made as needed for Centric Relation and protrusive strokes. Patient accepts results. Patient to keep dentures out if sore spots develop. Use salt water rinses as needed to aid healing. Return to clinic as scheduled for denture adjustment.   Call if problems arise before then.  Charlynne Panderonald F. Kulinski, DDS

## 2015-11-11 ENCOUNTER — Other Ambulatory Visit: Payer: Self-pay | Admitting: Internal Medicine

## 2015-11-22 ENCOUNTER — Encounter (INDEPENDENT_AMBULATORY_CARE_PROVIDER_SITE_OTHER): Payer: Self-pay

## 2015-11-22 ENCOUNTER — Encounter (HOSPITAL_COMMUNITY): Payer: Self-pay | Admitting: Dentistry

## 2015-11-22 ENCOUNTER — Ambulatory Visit (HOSPITAL_COMMUNITY): Payer: Medicaid - Dental | Admitting: Dentistry

## 2015-11-22 VITALS — BP 131/71 | HR 63 | Temp 98.0°F

## 2015-11-22 DIAGNOSIS — Z463 Encounter for fitting and adjustment of dental prosthetic device: Secondary | ICD-10-CM

## 2015-11-22 DIAGNOSIS — M264 Malocclusion, unspecified: Secondary | ICD-10-CM

## 2015-11-22 DIAGNOSIS — K08109 Complete loss of teeth, unspecified cause, unspecified class: Secondary | ICD-10-CM

## 2015-11-22 DIAGNOSIS — R220 Localized swelling, mass and lump, head: Secondary | ICD-10-CM

## 2015-11-22 DIAGNOSIS — K0820 Unspecified atrophy of edentulous alveolar ridge: Secondary | ICD-10-CM

## 2015-11-22 NOTE — Progress Notes (Signed)
11/22/2015  Patient Name:   [name] Date of Birth:   01/22/1961 Medical Record Number: [id number]  BP 131/71 mmHg  Pulse 63  Temp(Src) 98 F (36.7 C) (Oral)  [name] presents for evaluation of recently inserted upper lower complete dentures.  SUBJECTIVE: Patient is complaining of some denture irritation to the right mandibular anterior area. OBJECTIVE: There is no sign of denture irritation or erythema. Procedure: Pressure indicating paste was applied to the dentures. Minimal adjustments were made as needed. [place]. Occlusion evaluated and adjustments made as needed for Centric Relation and protrusive strokes. 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 Patient was instructed on finding the maximum intercuspation position. Patient accepts results. Patient to keep dentures out if sore spots develop. Use salt water rinses as needed to aid healing. Return to clinic as scheduled for denture adjustment.   Call if problems arise before then.  Charlynne Panderonald F. Kulinski, DDS

## 2015-11-22 NOTE — Patient Instructions (Signed)
Patient to keep dentures out if sore spots develop. Use salt water rinses as needed to aid healing. Return to clinic as scheduled for denture adjustment.   Call if problems arise before then.  [name], DDS  

## 2015-12-28 ENCOUNTER — Ambulatory Visit (HOSPITAL_COMMUNITY): Payer: Medicaid - Dental | Admitting: Dentistry

## 2015-12-28 ENCOUNTER — Encounter (HOSPITAL_COMMUNITY): Payer: Self-pay | Admitting: Dentistry

## 2015-12-28 VITALS — BP 126/68 | HR 63 | Temp 98.0°F

## 2015-12-28 DIAGNOSIS — M264 Malocclusion, unspecified: Secondary | ICD-10-CM

## 2015-12-28 DIAGNOSIS — Z463 Encounter for fitting and adjustment of dental prosthetic device: Secondary | ICD-10-CM

## 2015-12-28 DIAGNOSIS — K08109 Complete loss of teeth, unspecified cause, unspecified class: Secondary | ICD-10-CM

## 2015-12-28 DIAGNOSIS — R220 Localized swelling, mass and lump, head: Secondary | ICD-10-CM

## 2015-12-28 DIAGNOSIS — K0820 Unspecified atrophy of edentulous alveolar ridge: Secondary | ICD-10-CM

## 2015-12-28 NOTE — Patient Instructions (Signed)
Patient to keep dentures out if sore spots develop. Use salt water rinses as needed to aid healing. Return to clinic as scheduled for denture adjustment.   Call if problems arise before then.  Ronald F. Kulinski, DDS  

## 2015-12-28 NOTE — Progress Notes (Signed)
12/28/2015  Patient Name:   Brooke JewelsSandra L Hester Date of Birth:   04/29/1961 Medical Record Number: [id number]  BP 126/68 mmHg  Pulse 63  Temp(Src) 98 F (36.7 C) (Oral)  Brooke JewelsSandra L Hester presents for evaluation of recently inserted upper and lower complete dentures.  SUBJECTIVE: Patient is complaining of some soreness to lower jaw when she tries to eat without dentures in. Denies denture irritation. OBJECTIVE: There is no sign of denture irritation or erythema. Patient most likely is protruding lower jaw and her maxillary tuberosity is traumatizing her lower posterior alveolar ridge at that time. Patient was instructed not to do this and is to use her dentures for chewing and eating. Procedure: Pressure indicating paste was applied to the dentures. Minimal adjustments were made as needed. [place]. Occlusion evaluated and adjustments made as needed for Centric Relation and protrusive strokes. Patient is finding maximum intercuspation position without problems.  Patient accepts results. Patient to keep dentures out if sore spots develop. Use salt water rinses as needed to aid healing. Return to clinic as scheduled for denture adjustment.   Call if problems arise before then.  Charlynne Panderonald F. Kulinski, DDS

## 2016-01-08 ENCOUNTER — Emergency Department (HOSPITAL_COMMUNITY)
Admission: EM | Admit: 2016-01-08 | Discharge: 2016-01-09 | Disposition: A | Discharge status: Home / Self Care | Payer: Medicaid Other | Attending: Emergency Medicine | Admitting: Emergency Medicine

## 2016-01-08 ENCOUNTER — Encounter (HOSPITAL_COMMUNITY): Payer: Self-pay | Admitting: Emergency Medicine

## 2016-01-08 DIAGNOSIS — F1094 Alcohol use, unspecified with alcohol-induced mood disorder: Secondary | ICD-10-CM

## 2016-01-08 DIAGNOSIS — Z79891 Long term (current) use of opiate analgesic: Secondary | ICD-10-CM | POA: Insufficient documentation

## 2016-01-08 DIAGNOSIS — M1990 Unspecified osteoarthritis, unspecified site: Secondary | ICD-10-CM | POA: Diagnosis not present

## 2016-01-08 DIAGNOSIS — F17210 Nicotine dependence, cigarettes, uncomplicated: Secondary | ICD-10-CM | POA: Insufficient documentation

## 2016-01-08 DIAGNOSIS — K219 Gastro-esophageal reflux disease without esophagitis: Secondary | ICD-10-CM | POA: Insufficient documentation

## 2016-01-08 DIAGNOSIS — J42 Unspecified chronic bronchitis: Secondary | ICD-10-CM | POA: Diagnosis not present

## 2016-01-08 DIAGNOSIS — Z7951 Long term (current) use of inhaled steroids: Secondary | ICD-10-CM | POA: Insufficient documentation

## 2016-01-08 DIAGNOSIS — F329 Major depressive disorder, single episode, unspecified: Secondary | ICD-10-CM | POA: Diagnosis not present

## 2016-01-08 DIAGNOSIS — I10 Essential (primary) hypertension: Secondary | ICD-10-CM | POA: Insufficient documentation

## 2016-01-08 DIAGNOSIS — E039 Hypothyroidism, unspecified: Secondary | ICD-10-CM | POA: Diagnosis not present

## 2016-01-08 DIAGNOSIS — R440 Auditory hallucinations: Secondary | ICD-10-CM

## 2016-01-08 DIAGNOSIS — Z79899 Other long term (current) drug therapy: Secondary | ICD-10-CM | POA: Diagnosis not present

## 2016-01-08 DIAGNOSIS — F23 Brief psychotic disorder: Secondary | ICD-10-CM | POA: Diagnosis not present

## 2016-01-08 DIAGNOSIS — R45850 Homicidal ideations: Secondary | ICD-10-CM | POA: Diagnosis present

## 2016-01-08 DIAGNOSIS — F1010 Alcohol abuse, uncomplicated: Secondary | ICD-10-CM

## 2016-01-08 LAB — CBC
HCT: 44.4 % (ref 36.0–46.0)
Hemoglobin: 14.9 g/dL (ref 12.0–15.0)
MCH: 28.8 pg (ref 26.0–34.0)
MCHC: 33.6 g/dL (ref 30.0–36.0)
MCV: 85.9 fL (ref 78.0–100.0)
Platelets: 251 10*3/uL (ref 150–400)
RBC: 5.17 MIL/uL — ABNORMAL HIGH (ref 3.87–5.11)
RDW: 15.1 % (ref 11.5–15.5)
WBC: 7.1 10*3/uL (ref 4.0–10.5)

## 2016-01-08 MED ORDER — ZIPRASIDONE MESYLATE 20 MG IM SOLR
20.0000 mg | Freq: Once | INTRAMUSCULAR | Status: AC
  Administered 2016-01-08: 20 mg via INTRAMUSCULAR

## 2016-01-08 NOTE — ED Notes (Signed)
Pt here via [employer] with complaints of SI. Pt was aggressive with [employer] and staff at time of assessment. Per [employer] pt endorses hallucinations, telling her to hurt people. Pt is not cooperative at time of assessment and pt has flight of ideas.

## 2016-01-08 NOTE — ED Provider Notes (Signed)
CSN: [id number]     Arrival date & time 01/08/16  2309 History  By signing my name below, I, [name], attest that this documentation has been prepared under the direction and in the presence of non-physician practitioner, Antony Madura, PA-C. Electronically Signed: Marisue Hester, Scribe. 01/08/2016. 11:57 PM.      Chief Complaint  Patient presents with  . Suicidal  . Hallucinations  . Medical Clearance   The history is provided by the patient and the police. No language interpreter was used.   HPI Comments:  [name] is a 55 y.o. female with PMHx of bipolar disorder, anxiety, and depression who presents to the Emergency Department via [employer] complaining of thoughts of hurting someone else earlier this evening. Pt states voices were telling her to hurt her friend by stabbing him. Pt talked to her therapist, [name], earlier today; she sees the therapist once a week. Pt is currently on medication and she states it does help her. [employer] reports pt drank alcohol this evening. Denies thoughts of hurting herself.  Past Medical History  Diagnosis Date  . Complication of anesthesia     difficult to wake up and blood pressure drops  . Heart murmur   . Hypertension     sees Dr. Shana Hester, is patients primary  . Dysrhythmia     no medication treatment  . Chronic bronchitis (HCC)   . Pneumonia     hx  . Hypothyroidism     takes synthroid  . Anemia   . Headache(784.0)   . Arthritis   . Bursitis     hx of  . Mental disorder     bipolar  . Anxiety   . Depression   . Eczema   . GERD (gastroesophageal reflux disease)   . Diverticula of intestine    Past Surgical History  Procedure Laterality Date  . Cesarean section    . Tubal ligation    . Abdominal hysterectomy      has both of ovaries  . Colonoscopy    . Lumbar laminectomy  08/22/2011    Procedure: MICRODISCECTOMY LUMBAR LAMINECTOMY;  Surgeon: Brooke Heckle, MD;  Location: [facility];  Service: Neurosurgery;  Laterality:  Right;  RIGHT Lumbar Five-Sacral One microdiskectomy  . Multiple extractions with alveoloplasty N/A 08/20/2015    Procedure: Extraction of tooth #'s 1-12,14,20-27, 29 with alveoloplasty;  Surgeon: [name], DDS;  Location: [facility] OR;  Service: Oral Surgery;  Laterality: N/A;   Family History  Problem Relation Age of Onset  . Anesthesia problems Mother   . Cancer Mother     thyroid  . Hypertension Mother   . Anesthesia problems Sister   . Hypertension Sister   . Cancer Maternal Aunt   . Cancer Maternal Aunt   . Colon cancer Sister   . Stroke Sister   . Hypertension Brother    Social History  Substance Use Topics  . Smoking status: Current Every Day Smoker -- 0.25 packs/day for 20 years    Types: Cigarettes  . Smokeless tobacco: Never Used  . Alcohol Use: No     Comment: occ   OB History    No data available      Review of Systems  Unable to perform ROS: Psychiatric disorder    Allergies  Benazepril; Penicillins; Sulfa antibiotics; Ciprofloxacin hcl; Clindamycin/lincomycin; Erythromycin; Other; and Zithromax  Home Medications   Prior to Admission medications   Medication Sig Start Date End Date Taking? Authorizing Provider  albuterol (PROVENTIL  HFA;VENTOLIN HFA) 108 (90 BASE) MCG/ACT inhaler Inhale 2 puffs into the lungs every 4 (four) hours as needed for wheezing or shortness of breath. 08/02/13   Arthor Captain, PA-C  amLODipine (NORVASC) 10 MG tablet Take 1 tablet (10 mg total) by mouth daily. Take this instead of your LOTREL until you see your regular doctor. 08/09/15   Quentin Angst, MD  atenolol (TENORMIN) 50 MG tablet Take 1 tablet (50 mg total) by mouth daily. Need Dr appt for future refills. 08/31/15   Quentin Angst, MD  clorazepate (TRANXENE) 7.5 MG tablet Take 7.5 mg by mouth 2 (two) times daily.      Historical Provider, MD  cyclobenzaprine (FLEXERIL) 10 MG tablet Take 1 tablet (10 mg total) by mouth at bedtime. 01/29/15   Brooke Cheadle, MD   diphenhydrAMINE (BENADRYL) 25 MG tablet Take 1 tablet (25 mg total) by mouth every 6 (six) hours. 04/03/14   Brooke Camprubi-Soms, PA-C  docusate sodium (COLACE) 100 MG capsule Take 100 mg by mouth daily.     Historical Provider, MD  hydrocortisone cream 1 % Apply 1 application topically 2 (two) times daily. 12/09/14   Brooke Cheadle, MD  levofloxacin (LEVAQUIN) 750 MG tablet Take 1 tablet (750 mg total) by mouth daily. 08/22/15   Brooke Murphy, MD  levothyroxine (SYNTHROID, LEVOTHROID) 125 MCG tablet Take 125 mcg by mouth daily.      Historical Provider, MD  metroNIDAZOLE (FLAGYL) 500 MG tablet Take 1 tablet (500 mg total) by mouth every 8 (eight) hours. 08/22/15   Brooke Murphy, MD  oxyCODONE-acetaminophen (PERCOCET/ROXICET) 5-325 MG tablet Take 1-2 tablets by mouth every 4 (four) hours as needed for moderate pain. 08/22/15   Brooke Murphy, MD  QUEtiapine (SEROQUEL XR) 50 MG TB24 Take 50 mg by mouth at bedtime.     Historical Provider, MD  triamcinolone ointment (KENALOG) 0.1 % Apply 1 application topically daily. All over.    Historical Provider, MD   There were no vitals taken for this visit.   Physical Exam  Constitutional: She is oriented to person, place, and time. She appears well-developed and well-nourished. No distress.  HENT:  Head: Normocephalic and atraumatic.  Eyes: Conjunctivae and EOM are normal. No scleral icterus.  Neck: Normal range of motion.  Pulmonary/Chest: Effort normal. No respiratory distress.  Musculoskeletal: Normal range of motion.  Neurological: She is alert and oriented to person, place, and time. She exhibits normal muscle tone. Coordination normal.  Skin: Skin is warm and dry. No rash noted. She is not diaphoretic. No erythema. No pallor.  Psychiatric: Her affect is angry. Her speech is rapid and/or pressured. She is agitated, hyperactive and actively hallucinating. She expresses homicidal ideation. She expresses homicidal plans.  Nursing note and vitals  reviewed.   ED Course  Procedures   COORDINATION OF CARE:  11:38 PM Discussed treatment plan with pt at bedside and pt agreed to plan.  Labs Review Labs Reviewed  COMPREHENSIVE METABOLIC PANEL - Abnormal; Notable for the following:    Sodium 132 (*)    Chloride 99 (*)    Glucose, Bld 103 (*)    All other components within normal limits  ETHANOL - Abnormal; Notable for the following:    Alcohol, Ethyl (B) 270 (*)    All other components within normal limits  ACETAMINOPHEN LEVEL - Abnormal; Notable for the following:    Acetaminophen (Tylenol), Serum <10 (*)    All other components within normal limits  CBC - Abnormal; Notable for the following:  RBC 5.17 (*)    All other components within normal limits  URINE RAPID DRUG SCREEN, HOSP PERFORMED - Abnormal; Notable for the following:    Benzodiazepines POSITIVE (*)    All other components within normal limits  SALICYLATE LEVEL    Imaging Review No results found.   I have personally reviewed and evaluated these images and lab results as part of my medical decision-making.   EKG Interpretation None      MDM   Final diagnoses:  Acute psychosis  Auditory hallucination    55 year old female presents to the emergency department for further evaluation of homicidal ideations. [facility] and required [facility] commitment and sedating medication on arrival secondary to agitation. UA pending, but patient is medically cleared if UA negative for infection. Patient is pending TTS consultation and recommendations.  5:53 AM TTS recommends AM psych evaluation. Disposition TBD by oncoming ED provider.  I personally performed the services described in this documentation, which was scribed in my presence. The recorded information has been reviewed and is accurate.      Antony MaduraKelly Tonishia Steffy, PA-C 01/09/16 0554  Melene Planan Floyd, DO 01/09/16 [id number]

## 2016-01-09 DIAGNOSIS — F1010 Alcohol abuse, uncomplicated: Secondary | ICD-10-CM | POA: Diagnosis present

## 2016-01-09 DIAGNOSIS — F1094 Alcohol use, unspecified with alcohol-induced mood disorder: Secondary | ICD-10-CM | POA: Diagnosis present

## 2016-01-09 LAB — COMPREHENSIVE METABOLIC PANEL
ALT: 14 U/L (ref 14–54)
AST: 20 U/L (ref 15–41)
Albumin: 4.6 g/dL (ref 3.5–5.0)
Alkaline Phosphatase: 114 U/L (ref 38–126)
Anion gap: 11 (ref 5–15)
BUN: 9 mg/dL (ref 6–20)
CO2: 22 mmol/L (ref 22–32)
Calcium: 9 mg/dL (ref 8.9–10.3)
Chloride: 99 mmol/L — ABNORMAL LOW (ref 101–111)
Creatinine, Ser: 0.64 mg/dL (ref 0.44–1.00)
GFR calc Af Amer: >60 mL/min (ref >60)
GFR calc non Af Amer: >60 mL/min (ref >60)
Glucose, Bld: 103 mg/dL — ABNORMAL HIGH (ref 65–99)
Potassium: 3.7 mmol/L (ref 3.5–5.1)
Sodium: 132 mmol/L — ABNORMAL LOW (ref 135–145)
Total Bilirubin: 0.3 mg/dL (ref 0.3–1.2)
Total Protein: 8.1 g/dL (ref 6.5–8.1)

## 2016-01-09 LAB — URINALYSIS, ROUTINE W REFLEX MICROSCOPIC
Bilirubin Urine: NEGATIVE
Glucose, UA: NEGATIVE mg/dL
Hgb urine dipstick: NEGATIVE
Ketones, ur: NEGATIVE mg/dL
Leukocytes, UA: NEGATIVE
Nitrite: NEGATIVE
Protein, ur: NEGATIVE mg/dL
Specific Gravity, Urine: 1.002 — ABNORMAL LOW (ref 1.005–1.030)
pH: 6 (ref 5.0–8.0)

## 2016-01-09 LAB — RAPID URINE DRUG SCREEN, HOSP PERFORMED
Amphetamines: NOT DETECTED
Barbiturates: NOT DETECTED
Benzodiazepines: POSITIVE — AB
Cocaine: NOT DETECTED
Opiates: NOT DETECTED
Tetrahydrocannabinol: NOT DETECTED

## 2016-01-09 LAB — ETHANOL: Alcohol, Ethyl (B): 270 mg/dL — ABNORMAL HIGH (ref <5)

## 2016-01-09 LAB — ACETAMINOPHEN LEVEL: Acetaminophen (Tylenol), Serum: <10 ug/mL — ABNORMAL LOW (ref 10–30)

## 2016-01-09 LAB — SALICYLATE LEVEL: Salicylate Lvl: <4 mg/dL (ref 2.8–30.0)

## 2016-01-09 MED ORDER — LORAZEPAM 1 MG PO TABS: 0.0000 mg | ORAL_TABLET | Freq: Four times a day (QID) | ORAL | Status: DC

## 2016-01-09 MED ORDER — LORAZEPAM 1 MG PO TABS: 0.0000 mg | ORAL_TABLET | Freq: Two times a day (BID) | ORAL | Status: DC

## 2016-01-09 MED ORDER — NICOTINE 21 MG/24HR TD PT24: 21.0000 mg | MEDICATED_PATCH | Freq: Every day | TRANSDERMAL | Status: DC

## 2016-01-09 NOTE — BH Assessment (Addendum)
Assessment completed. Consulted with Maryjean Mornharles Kober, PA-C who recommends observing patient overnight and evaluation by psychiatry in the morning to uphold or rescind IVC. He also recommends notifying patients psychiatric provider of her use of Tranxene and Alcohol due to danger of the combination.   Davina PokeJoVea Cherylyn Sundby, LCSW Therapeutic Triage Specialist [facility] 01/09/2016 6:00 AM

## 2016-01-09 NOTE — ED Notes (Signed)
CSW into see 

## 2016-01-09 NOTE — ED Notes (Addendum)
Written dc instructions reviewed w/ pt.  Pt encouraged to take her medications as directed and follow up w/ her provider.  Pt denies si/hi/avh on dc and was encouraged to seek treatment if they return.  Pt verbalized understanding.  Son present during dc w/ pt's permission.  Pt lives w/ her son and is returning with him.  Pt ambulatory w/o difficulty to dc area w/ mHt, belongings returned after leaving the unit.
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
## 2016-01-09 NOTE — Clinical Social Work Note (Signed)
CSW provided rescinding paperwork per psychiatry request.  Forms signed and filed.  Pt is being discharged and psychiatry rescinded IVC.  CSW met with pt to ask who she wanted to come and pick her up and she stated that CSW could call her son [place].  CSW spoke with [place] who stated that he would come and pick pt up to take her home.  Dede Query, LCSW [place] Worker - Weekend Coverage cell #: [phone]

## 2016-01-09 NOTE — [facility] Suicide Risk Assessment (Signed)
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 Alcohol abuse [F10.10] 01/09/2016    Priority: High  . Alcohol-induced mood disorder (HCC) [F10.94] 01/09/2016    Priority: High  . Diverticulitis [K57.92] 08/15/2015  . Diverticulitis of large intestine with abscess  [K57.20] 11/23/2014  . Acute diverticulitis [K57.92] 11/23/2014  . Leukocytosis [D72.829] 11/17/2014  . Essential hypertension [I10] 11/17/2014  . Heart murmur [R01.1]   . Chronic bronchitis (HCC) [J42]   . Hypothyroidism [E03.9]   . Anxiety [F41.9]   . Depression [F32.9]   . Mental disorder [F99]   . GERD (gastroesophageal reflux disease) [K21.9]   . Simple chronic bronchitis (HCC) [J41.0]     Total Time spent with patient: 45 minutes  Musculoskeletal: Strength & Muscle Tone: within normal limits Gait & Station: normal Patient leans: N/A  Psychiatric Specialty Exam: Review of Systems  Constitutional: Negative.   HENT: Negative.   Eyes: Negative.   Respiratory: Negative.   Cardiovascular: Negative.   Gastrointestinal: Negative.   Genitourinary: Negative.   Skin: Negative.   Neurological: Negative.   Endo/Heme/Allergies: Negative.   Psychiatric/Behavioral: Positive for substance abuse.    Blood pressure 151/83, pulse 80, resp. rate 18, SpO2 99 %.There is no weight on file to calculate BMI.  General Appearance: Casual  Eye Contact::  Good  Speech:  Normal Rate  Volume:  Normal  Mood:  Euthymic  Affect:  Congruent  Thought Process:  Coherent  Orientation:  Full (Time, Place, and Person)  Thought Content:  WDL  Suicidal Thoughts:  No  Homicidal Thoughts:  No  Memory:  Immediate;   Good Recent;   Good Remote;   Good  Judgement:  Fair  Insight:  Fair  Psychomotor Activity:  Normal  Concentration:  Good  Recall:  Good  Fund of  Knowledge:Good  Language: Good  Akathisia:  No  Handed:  Right  AIMS (if indicated):     Assets:  Housing Leisure Time Physical Health Resilience Social Support  ADL's:  Intact  Cognition: WNL  Sleep:      Mental Status Per Nursing Assessment::   On Admission:    homicidal ideations  Demographic Factors:  NA  Loss Factors: NA  Historical Factors: NA  Risk Reduction Factors:   Sense of responsibility to family, Living with another person, especially a relative, Positive social support and Positive therapeutic relationship  Continued Clinical Symptoms:  None   Cognitive Features That Contribute To Risk:  None    Suicide Risk:  Minimal: No identifiable suicidal ideation.  Patients presenting with no risk factors but with morbid ruminations; may be classified as minimal risk based on the severity of the depressive symptoms    Plan Of Care/Follow-up recommendations:  Activity:  as tolerated Diet:  heart healthy diet  Brianda Beitler, NP 01/09/2016, 12:23 PM

## 2016-01-09 NOTE — ED Notes (Signed)
Pt's son is here to pick her up 
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
## 2016-01-09 NOTE — ED Notes (Signed)
Up to the bathroom 
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
## 2016-01-09 NOTE — ED Notes (Signed)
IVC has been rescinded, pt's son is her to pick her up.  Pt eating lunch

## 2016-01-09 NOTE — ED Notes (Signed)
Ambulatory w/o difficulty from ED to room 34

## 2016-02-02 ENCOUNTER — Ambulatory Visit (INDEPENDENT_AMBULATORY_CARE_PROVIDER_SITE_OTHER): Payer: Medicaid Other | Admitting: Obstetrics

## 2016-02-02 ENCOUNTER — Encounter: Payer: Self-pay | Admitting: Obstetrics

## 2016-02-02 VITALS — BP 128/80 | HR 59 | Ht 67.0 in | Wt 207.0 lb

## 2016-02-02 DIAGNOSIS — Z01419 Encounter for gynecological examination (general) (routine) without abnormal findings: Secondary | ICD-10-CM

## 2016-02-02 DIAGNOSIS — Z780 Asymptomatic menopausal state: Secondary | ICD-10-CM

## 2016-02-02 DIAGNOSIS — Z0000 Encounter for general adult medical examination without abnormal findings: Secondary | ICD-10-CM | POA: Diagnosis not present

## 2016-02-02 NOTE — Progress Notes (Signed)
Subjective:        [name] is a 55 y.o. female here for a routine exam.  Current complaints: No complaints.    Personal health questionnaire:  Is patient [name], have a family history of breast and/or ovarian cancer: no Is there a family history of uterine cancer diagnosed at age < 72, gastrointestinal cancer, urinary tract cancer, family member who is a Personnel officer syndrome-associated carrier: no Is the patient overweight and hypertensive, family history of diabetes, personal history of gestational diabetes, preeclampsia or PCOS: no Is patient over 50, have PCOS,  family history of premature CHD under age 78, diabetes, smoke, have hypertension or peripheral artery disease:  no At any time, has a partner hit, kicked or otherwise hurt or frightened you?: no Over the past 2 weeks, have you felt down, depressed or hopeless?: no Over the past 2 weeks, have you felt little interest or pleasure in doing things?:no   Gynecologic History No LMP recorded. Patient has had a hysterectomy. Contraception: status post hysterectomy Last Pap: 2009. Results were: normal Last mammogram: 2016. Results were: normal  Obstetric History OB History  No data available    Past Medical History  Diagnosis Date  . Complication of anesthesia     difficult to wake up and blood pressure drops  . Heart murmur   . Hypertension     sees Dr. Shana Chute, is patients primary  . Dysrhythmia     no medication treatment  . Chronic bronchitis (HCC)   . Pneumonia     hx  . Hypothyroidism     takes synthroid  . Anemia   . Headache(784.0)   . Arthritis   . Bursitis     hx of  . Mental disorder     bipolar  . Anxiety   . Depression   . Eczema   . GERD (gastroesophageal reflux disease)   . Diverticula of intestine     Past Surgical History  Procedure Laterality Date  . Cesarean section    . Tubal ligation    . Abdominal hysterectomy      has both of ovaries  . Colonoscopy    . Lumbar  laminectomy  08/22/2011    Procedure: MICRODISCECTOMY LUMBAR LAMINECTOMY;  Surgeon: Dorian Heckle, MD;  Location: [facility];  Service: Neurosurgery;  Laterality: Right;  RIGHT Lumbar Five-Sacral One microdiskectomy  . Multiple extractions with alveoloplasty N/A 08/20/2015    Procedure: Extraction of tooth #'s 1-12,14,20-27, 29 with alveoloplasty;  Surgeon: Charlynne Pander, DDS;  Location[facility];  Service: Oral Surgery;  Laterality: N/A;  . Mouth surgery       Current outpatient prescriptions:  .  amLODipine (NORVASC) 10 MG tablet, Take 1 tablet (10 mg total) by mouth daily. Take this instead of your LOTREL until you see your regular doctor., Disp: 30 tablet, Rfl: 0 .  atenolol (TENORMIN) 50 MG tablet, Take 1 tablet (50 mg total) by mouth daily. Need Dr appt for future refills., Disp: 30 tablet, Rfl: 1 .  clorazepate (TRANXENE) 7.5 MG tablet, Take 7.5 mg by mouth 2 (two) times daily.  , Disp: , Rfl:  .  levothyroxine (SYNTHROID, LEVOTHROID) 125 MCG tablet, Take 125 mcg by mouth daily.  , Disp: , Rfl:  .  QUEtiapine (SEROQUEL XR) 50 MG TB24, Take 50 mg by mouth at bedtime. , Disp: , Rfl:  .  albuterol (PROVENTIL HFA;VENTOLIN HFA) 108 (90 BASE) MCG/ACT inhaler, Inhale 2 puffs into the lungs every 4 (  four) hours as needed for wheezing or shortness of breath., Disp: 1 Inhaler, Rfl: 0 .  cyclobenzaprine (FLEXERIL) 10 MG tablet, Take 1 tablet (10 mg total) by mouth at bedtime. (Patient not taking: Reported on 02/02/2016), Disp: 30 tablet, Rfl: 1 .  diphenhydrAMINE (BENADRYL) 25 MG tablet, Take 1 tablet (25 mg total) by mouth every 6 (six) hours. (Patient not taking: Reported on 02/02/2016), Disp: 20 tablet, Rfl: 0 .  docusate sodium (COLACE) 100 MG capsule, Take 100 mg by mouth daily. Reported on 02/02/2016, Disp: , Rfl:  .  hydrocortisone cream 1 %, Apply 1 application topically 2 (two) times daily., Disp: 30 g, Rfl: 1 .  triamcinolone ointment (KENALOG) 0.1 %, Apply 1 application topically daily.  Reported on 02/02/2016, Disp: , Rfl:  .  [DISCONTINUED] amLODipine-benazepril (LOTREL) 5-20 MG per capsule, Take 1 capsule by mouth daily., Disp: , Rfl:  Allergies  Allergen Reactions  . Benazepril Anaphylaxis, Hives and Itching  . Penicillins Hives    Has itching and some swelling in face  . Sulfa Antibiotics Hives    Itching and facial swelling  . Ciprofloxacin Hcl Hives    "funny sensation in gums"  . Clindamycin/Lincomycin Hives    Itching, welps  . Erythromycin Hives    itching  . Other     Pt has problems with being awaken from anesthesia, Her blood pressure drops very low  . Zithromax [Azithromycin] Hives    itching    Social History  Substance Use Topics  . Smoking status: Current Every Day Smoker -- 0.25 packs/day for 20 years    Types: Cigarettes  . Smokeless tobacco: Never Used     Comment: 4-6 cigs/day  . Alcohol Use: 0.0 oz/week    0 Standard drinks or equivalent per week     Comment: occ    Family History  Problem Relation Age of Onset  . Anesthesia problems Mother   . Cancer Mother     thyroid  . Hypertension Mother   . Anesthesia problems Sister   . Hypertension Sister   . Cancer Maternal Aunt   . Cancer Maternal Aunt   . Colon cancer Sister   . Stroke Sister   . Hypertension Brother       Review of Systems  Constitutional: negative for fatigue and weight loss Respiratory: negative for cough and wheezing Cardiovascular: negative for chest pain, fatigue and palpitations Gastrointestinal: negative for abdominal pain and change in bowel habits Musculoskeletal:negative for myalgias Neurological: negative for gait problems and tremors Behavioral/Psych: negative for abusive relationship, depression Endocrine: negative for temperature intolerance   Genitourinary:negative for abnormal menstrual periods, genital lesions, hot flashes, sexual problems and vaginal discharge Integument/breast: negative for breast lump, breast tenderness, nipple discharge and  skin lesion(s)    Objective:       BP 128/80 mmHg  Pulse 59  Ht 5\' 7"  (1.702 m)  Wt 207 lb (93.895 kg)  BMI 32.41 kg/m2 General:   alert  Skin:   no rash or abnormalities  Lungs:   clear to auscultation bilaterally  Heart:   regular rate and rhythm, S1, S2 normal, no murmur, click, rub or gallop  Breasts:   normal without suspicious masses, skin or nipple changes or axillary nodes  Abdomen:  normal findings: no organomegaly, soft, non-tender and no hernia  Pelvis:  External genitalia: normal general appearance Urinary system: urethral meatus normal and bladder without fullness, nontender Vaginal: normal without tenderness, induration or masses Cervix: absent Adnexa: normal bimanual exam Uterus: absent  Lab Review Urine pregnancy test Labs reviewed yes Radiologic studies reviewed yes    Assessment:    Healthy female exam.    Menopause.  Doing well.   Plan:    Education reviewed: calcium supplements, low fat, low cholesterol diet, self breast exams and weight bearing exercise. Follow up in: 1 year.   No orders of the defined types were placed in this encounter.   No orders of the defined types were placed in this encounter.

## 2016-02-02 NOTE — Addendum Note (Signed)
Addended by: Marya LandryFOSTER, Phillippa Straub D on: 02/02/2016 02:10 PM   Modules accepted: Orders

## 2016-02-05 LAB — NUSWAB VG, CANDIDA 6SP
Candida albicans, NAA: NEGATIVE
Candida glabrata, NAA: NEGATIVE
Candida krusei, NAA: NEGATIVE
Candida lusitaniae, NAA: NEGATIVE
Candida parapsilosis, NAA: NEGATIVE
Candida tropicalis, NAA: NEGATIVE
Trich vag by NAA: NEGATIVE

## 2016-03-11 ENCOUNTER — Emergency Department (HOSPITAL_COMMUNITY): Payer: Medicaid Other

## 2016-03-11 ENCOUNTER — Emergency Department (HOSPITAL_COMMUNITY)
Admission: EM | Admit: 2016-03-11 | Discharge: 2016-03-11 | Disposition: A | Discharge status: Home / Self Care | Payer: Medicaid Other | Attending: Emergency Medicine | Admitting: Emergency Medicine

## 2016-03-11 ENCOUNTER — Encounter (HOSPITAL_COMMUNITY): Payer: Self-pay | Admitting: Emergency Medicine

## 2016-03-11 DIAGNOSIS — E039 Hypothyroidism, unspecified: Secondary | ICD-10-CM | POA: Diagnosis not present

## 2016-03-11 DIAGNOSIS — S0083XA Contusion of other part of head, initial encounter: Secondary | ICD-10-CM

## 2016-03-11 DIAGNOSIS — Y939 Activity, unspecified: Secondary | ICD-10-CM | POA: Insufficient documentation

## 2016-03-11 DIAGNOSIS — Z79899 Other long term (current) drug therapy: Secondary | ICD-10-CM | POA: Insufficient documentation

## 2016-03-11 DIAGNOSIS — R51 Headache: Secondary | ICD-10-CM | POA: Diagnosis not present

## 2016-03-11 DIAGNOSIS — I10 Essential (primary) hypertension: Secondary | ICD-10-CM | POA: Insufficient documentation

## 2016-03-11 DIAGNOSIS — M1990 Unspecified osteoarthritis, unspecified site: Secondary | ICD-10-CM | POA: Insufficient documentation

## 2016-03-11 DIAGNOSIS — Y999 Unspecified external cause status: Secondary | ICD-10-CM | POA: Diagnosis not present

## 2016-03-11 DIAGNOSIS — F17210 Nicotine dependence, cigarettes, uncomplicated: Secondary | ICD-10-CM | POA: Diagnosis not present

## 2016-03-11 DIAGNOSIS — F10120 Alcohol abuse with intoxication, uncomplicated: Secondary | ICD-10-CM | POA: Diagnosis present

## 2016-03-11 DIAGNOSIS — F329 Major depressive disorder, single episode, unspecified: Secondary | ICD-10-CM | POA: Insufficient documentation

## 2016-03-11 DIAGNOSIS — W0110XA Fall on same level from slipping, tripping and stumbling with subsequent striking against unspecified object, initial encounter: Secondary | ICD-10-CM | POA: Diagnosis not present

## 2016-03-11 DIAGNOSIS — Y929 Unspecified place or not applicable: Secondary | ICD-10-CM | POA: Diagnosis not present

## 2016-03-11 DIAGNOSIS — W19XXXA Unspecified fall, initial encounter: Secondary | ICD-10-CM

## 2016-03-11 DIAGNOSIS — F10920 Alcohol use, unspecified with intoxication, uncomplicated: Secondary | ICD-10-CM

## 2016-03-11 NOTE — ED Notes (Signed)
Pt complaining of pain on the right parietal part of her head where she fell. Denies neck or back pain at this time. Pt is ambulatory without assistance.

## 2016-03-11 NOTE — ED Notes (Signed)
Pt transported to CT ?

## 2016-03-11 NOTE — ED Provider Notes (Signed)
CSN: [id number]     Arrival date & time 03/11/16  0118 History   First MD Initiated Contact with Patient 03/11/16 [id number]     Chief Complaint  Patient presents with  . Fall  . Alcohol Intoxication     (Consider location/radiation/quality/duration/timing/severity/associated sxs/prior Treatment) HPI Comments: Patient reports she slipped on a wet surface in her home and fell backwards, hitting her head on the floor. She did not lose consciousness. She has not had nausea or vomiting since the fall. She admits to drinking heavily tonight. She is not on blood thinners. No neck or back pain, chest or abdominal pain.   Patient is a 55 y.o. female presenting with fall and intoxication. The history is provided by the patient. No language interpreter was used.  Fall This is a new problem. The current episode started today. Associated symptoms include headaches. Pertinent negatives include no abdominal pain, chills, fever, nausea, neck pain or vomiting.  Alcohol Intoxication Associated symptoms include headaches. Pertinent negatives include no abdominal pain, chills, fever, nausea, neck pain or vomiting.    Past Medical History  Diagnosis Date  . Complication of anesthesia     difficult to wake up and blood pressure drops  . Heart murmur   . Hypertension     sees Dr. Shana Chute, is patients primary  . Dysrhythmia     no medication treatment  . Chronic bronchitis (HCC)   . Pneumonia     hx  . Hypothyroidism     takes synthroid  . Anemia   . Headache(784.0)   . Arthritis   . Bursitis     hx of  . Mental disorder     bipolar  . Anxiety   . Depression   . Eczema   . GERD (gastroesophageal reflux disease)   . Diverticula of intestine    Past Surgical History  Procedure Laterality Date  . Cesarean section    . Tubal ligation    . Abdominal hysterectomy      has both of ovaries  . Colonoscopy    . Lumbar laminectomy  08/22/2011    Procedure: MICRODISCECTOMY LUMBAR LAMINECTOMY;  Surgeon:  Dorian Heckle, MD;  Location: [facility];  Service: Neurosurgery;  Laterality: Right;  RIGHT Lumbar Five-Sacral One microdiskectomy  . Multiple extractions with alveoloplasty N/A 08/20/2015    Procedure: Extraction of tooth #'s 1-12,14,20-27, 29 with alveoloplasty;  Surgeon: Charlynne Pander, DDS;  Location: [facility] OR;  Service: Oral Surgery;  Laterality: N/A;  . Mouth surgery     Family History  Problem Relation Age of Onset  . Anesthesia problems Mother   . Cancer Mother     thyroid  . Hypertension Mother   . Anesthesia problems Sister   . Hypertension Sister   . Cancer Maternal Aunt   . Cancer Maternal Aunt   . Colon cancer Sister   . Stroke Sister   . Hypertension Brother    Social History  Substance Use Topics  . Smoking status: Current Every Day Smoker -- 0.25 packs/day for 20 years    Types: Cigarettes  . Smokeless tobacco: Never Used     Comment: 4-6 cigs/day  . Alcohol Use: 0.0 oz/week    0 Standard drinks or equivalent per week     Comment: occ   OB History    No data available     Review of Systems  Constitutional: Negative for fever and chills.  Respiratory: Negative.  Negative for shortness of breath.   Cardiovascular: Negative.  Gastrointestinal: Negative.  Negative for nausea, vomiting and abdominal pain.  Musculoskeletal: Negative.  Negative for back pain and neck pain.  Skin: Negative.  Negative for wound.  Neurological: Positive for headaches.      Allergies  Benazepril; Penicillins; Sulfa antibiotics; Ciprofloxacin hcl; Clindamycin/lincomycin; Erythromycin; Other; Zithromax; and Sulfamethoxazole  Home Medications   Prior to Admission medications   Medication Sig Start Date End Date Taking? Authorizing Provider  albuterol (PROVENTIL HFA;VENTOLIN HFA) 108 (90 BASE) MCG/ACT inhaler Inhale 2 puffs into the lungs every 4 (four) hours as needed for wheezing or shortness of breath. 08/02/13  Yes Abigail Harris, PA-C  amLODipine (NORVASC) 10 MG tablet  Take 1 tablet (10 mg total) by mouth daily. Take this instead of your LOTREL until you see your regular doctor. 08/09/15  Yes Quentin Angst, MD  atenolol (TENORMIN) 50 MG tablet Take 1 tablet (50 mg total) by mouth daily. Need Dr appt for future refills. 08/31/15  Yes Quentin Angst, MD  clorazepate (TRANXENE) 7.5 MG tablet Take 7.5 mg by mouth 2 (two) times daily.     Yes Historical Provider, MD  levothyroxine (SYNTHROID, LEVOTHROID) 125 MCG tablet Take 125 mcg by mouth daily.     Yes Historical Provider, MD  polycarbophil (FIBERCON) 625 MG tablet Take 625 mg by mouth daily as needed (for regularity).   Yes Historical Provider, MD  polyethylene glycol (MIRALAX / GLYCOLAX) packet Take 17 g by mouth daily as needed for mild constipation.   Yes Historical Provider, MD  QUEtiapine (SEROQUEL XR) 50 MG TB24 Take 12.5 mg by mouth at bedtime.    Yes Historical Provider, MD  triamcinolone ointment (KENALOG) 0.1 % Apply 1 application topically daily. Reported on 02/02/2016   Yes Historical Provider, MD   BP 145/76 mmHg  Pulse 61  Temp(Src) 97.8 F (36.6 C) (Oral)  Resp 15  SpO2 100% Physical Exam  Constitutional: She is oriented to person, place, and time. She appears well-developed and well-nourished.  HENT:  Head: Normocephalic.  Occipital hematoma without laceration.   Neck: Normal range of motion. Neck supple.  Cardiovascular: Normal rate and regular rhythm.   Pulmonary/Chest: Effort normal and breath sounds normal.  Abdominal: Soft. Bowel sounds are normal. There is no tenderness. There is no rebound and no guarding.  Musculoskeletal: Normal range of motion.  No midline cervical tenderness. No spinal tenderness.   Neurological: She is alert and oriented to person, place, and time. She has normal strength. No sensory deficit. Coordination and gait normal.  Speech clear, focused and goal directed. CN's 3-12 grossly intact.   Skin: Skin is warm and dry. No rash noted.  Psychiatric: She  has a normal mood and affect.    ED Course  Procedures (including critical care time) Labs Review Labs Reviewed - No data to display  Imaging Review Ct Head Wo Contrast  03/11/2016  CLINICAL DATA:  Status post head and cervical spine injury. Initial encounter. EXAM: CT HEAD WITHOUT CONTRAST CT CERVICAL SPINE WITHOUT CONTRAST TECHNIQUE: Multidetector CT imaging of the head and cervical spine was performed following the standard protocol without intravenous contrast. Multiplanar CT image reconstructions of the cervical spine were also generated. COMPARISON:  CT of the head performed 07/27/2013, and CTA of the neck performed 02/20/2008 FINDINGS: CT HEAD FINDINGS There is no evidence of acute infarction, mass lesion, or intra- or extra-axial hemorrhage on CT. The posterior fossa, including the cerebellum, brainstem and fourth ventricle, is within normal limits. The third and lateral ventricles, and basal ganglia  are unremarkable in appearance. The cerebral hemispheres are symmetric in appearance, with normal gray-white differentiation. No mass effect or midline shift is seen. There is no evidence of fracture; visualized osseous structures are unremarkable in appearance. The visualized portions of the orbits are within normal limits. A mucus retention cyst or polyp is noted at the left maxillary sinus. The remaining paranasal sinuses and mastoid air cells are well-aerated. No significant soft tissue abnormalities are seen. CT CERVICAL SPINE FINDINGS There is no evidence of fracture or subluxation. Mild reversal of the normal lordotic curvature of the cervical spine is likely positional in nature. Endplate sclerotic change is noted at C2, and at C5-C6, with vacuum phenomenon at C5-C6. Underlying anterior and posterior disc osteophyte complexes are seen at C5-C6. Vertebral bodies demonstrate normal height. Prevertebral soft tissues are within normal limits. The thyroid gland is unremarkable in appearance. The  visualized lung apices are clear. No significant soft tissue abnormalities are seen. IMPRESSION: 1. No evidence of traumatic intracranial injury or fracture. 2. No evidence of fracture or subluxation along the cervical spine. 3. Mucus retention cyst or polyp at the left maxillary sinus. 4. Mild degenerative change along the cervical spine. Electronically Signed   By: Roanna Raider M.D.   On: 03/11/2016 03:36   Ct Cervical Spine Wo Contrast  03/11/2016  CLINICAL DATA:  Status post head and cervical spine injury. Initial encounter. EXAM: CT HEAD WITHOUT CONTRAST CT CERVICAL SPINE WITHOUT CONTRAST TECHNIQUE: Multidetector CT imaging of the head and cervical spine was performed following the standard protocol without intravenous contrast. Multiplanar CT image reconstructions of the cervical spine were also generated. COMPARISON:  CT of the head performed 07/27/2013, and CTA of the neck performed 02/20/2008 FINDINGS: CT HEAD FINDINGS There is no evidence of acute infarction, mass lesion, or intra- or extra-axial hemorrhage on CT. The posterior fossa, including the cerebellum, brainstem and fourth ventricle, is within normal limits. The third and lateral ventricles, and basal ganglia are unremarkable in appearance. The cerebral hemispheres are symmetric in appearance, with normal gray-white differentiation. No mass effect or midline shift is seen. There is no evidence of fracture; visualized osseous structures are unremarkable in appearance. The visualized portions of the orbits are within normal limits. A mucus retention cyst or polyp is noted at the left maxillary sinus. The remaining paranasal sinuses and mastoid air cells are well-aerated. No significant soft tissue abnormalities are seen. CT CERVICAL SPINE FINDINGS There is no evidence of fracture or subluxation. Mild reversal of the normal lordotic curvature of the cervical spine is likely positional in nature. Endplate sclerotic change is noted at C2, and at  C5-C6, with vacuum phenomenon at C5-C6. Underlying anterior and posterior disc osteophyte complexes are seen at C5-C6. Vertebral bodies demonstrate normal height. Prevertebral soft tissues are within normal limits. The thyroid gland is unremarkable in appearance. The visualized lung apices are clear. No significant soft tissue abnormalities are seen. IMPRESSION: 1. No evidence of traumatic intracranial injury or fracture. 2. No evidence of fracture or subluxation along the cervical spine. 3. Mucus retention cyst or polyp at the left maxillary sinus. 4. Mild degenerative change along the cervical spine. Electronically Signed   By: Roanna Raider M.D.   On: 03/11/2016 03:36   I have personally reviewed and evaluated these images and lab results as part of my medical decision-making.   EKG Interpretation None      MDM   Final diagnoses:  None    1. Fall 2. Occipital hematoma 3. Alcohol intoxication  Patient to ED via EMS after fall where she hit her head after slipping on wet surface. Negative imaging for intracranial or neck injury. She is ambulatory in the ED. She is intoxicated but oriented. She is stable for discharge home.     Elpidio AnisShari Janautica Netzley, PA-C 03/11/16 [id number]  Arby BarretteMarcy Pfeiffer, MD 03/11/16 [phone]

## 2016-03-11 NOTE — Discharge Instructions (Signed)
Alcohol Intoxication Alcohol intoxication occurs when the amount of alcohol that a person has consumed impairs his or her ability to mentally and physically function. Alcohol directly impairs the normal chemical activity of the brain. Drinking large amounts of alcohol can lead to changes in mental function and behavior, and it can cause many physical effects that can be harmful.  Alcohol intoxication can range in severity from mild to very severe. Various factors can affect the level of intoxication that occurs, such as the person's age, gender, weight, frequency of alcohol consumption, and the presence of other medical conditions (such as diabetes, seizures, or heart conditions). Dangerous levels of alcohol intoxication may occur when people drink large amounts of alcohol in a short period (binge drinking). Alcohol can also be especially dangerous when combined with certain prescription medicines or "recreational" drugs. SIGNS AND SYMPTOMS Some common signs and symptoms of mild alcohol intoxication include:  Loss of coordination.  Changes in mood and behavior.  Impaired judgment.  Slurred speech. As alcohol intoxication progresses to more severe levels, other signs and symptoms will appear. These may include:  Vomiting.  Confusion and impaired memory.  Slowed breathing.  Seizures.  Loss of consciousness. DIAGNOSIS  Your health care provider will take a medical history and perform a physical exam. You will be asked about the amount and type of alcohol you have consumed. Blood tests will be done to measure the concentration of alcohol in your blood. In many places, your blood alcohol level must be lower than 80 mg/dL (1.61%) to legally drive. However, many dangerous effects of alcohol can occur at much lower levels.  TREATMENT  People with alcohol intoxication often do not require treatment. Most of the effects of alcohol intoxication are temporary, and they go away as the alcohol naturally  leaves the body. Your health care provider will monitor your condition until you are stable enough to go home. Fluids are sometimes given through an IV access tube to help prevent dehydration.  HOME CARE INSTRUCTIONS  Do not drive after drinking alcohol.  Stay hydrated. Drink enough water and fluids to keep your urine clear or pale yellow. Avoid caffeine.   Only take over-the-counter or prescription medicines as directed by your health care provider.  SEEK MEDICAL CARE IF:   You have persistent vomiting.   You do not feel better after a few days.  You have frequent alcohol intoxication. Your health care provider can help determine if you should see a substance use treatment counselor. SEEK IMMEDIATE MEDICAL CARE IF:   You become shaky or tremble when you try to stop drinking.   You shake uncontrollably (seizure).   You throw up (vomit) blood. This may be bright red or may look like black coffee grounds.   You have blood in your stool. This may be bright red or may appear as a black, tarry, bad smelling stool.   You become lightheaded or faint.  MAKE SURE YOU:   Understand these instructions.  Will watch your condition.  Will get help right away if you are not doing well or get worse.   This information is not intended to replace advice given to you by your health care provider. Make sure you discuss any questions you have with your health care provider.   Document Released: 05/31/2005 Document Revised: 04/23/2013 Document Reviewed: 01/24/2013 Elsevier Interactive Patient Education 2016 Elsevier Inc[employer] Hematoma A hematoma is a collection of blood under the skin, in an organ, in a body space, in a joint space,  or in other tissue. The blood can clot to form a lump that you can see and feel. The lump is often firm and may sometimes become sore and tender. Most hematomas get better in a few days to weeks. However, some hematomas may be serious and require medical care.  Hematomas can range in size from very small to very large. CAUSES  A hematoma can be caused by a blunt or penetrating injury. It can also be caused by spontaneous leakage from a blood vessel under the skin. Spontaneous leakage from a blood vessel is more likely to occur in older people, especially those taking blood thinners. Sometimes, a hematoma can develop after certain medical procedures. SIGNS AND SYMPTOMS   A firm lump on the body.  Possible pain and tenderness in the area.  Bruising.Blue, dark blue, purple-red, or yellowish skin may appear at the site of the hematoma if the hematoma is close to the surface of the skin. For hematomas in deeper tissues or body spaces, the signs and symptoms may be subtle. For example, an intra-abdominal hematoma may cause abdominal pain, weakness, fainting, and shortness of breath. An intracranial hematoma may cause a headache or symptoms such as weakness, trouble speaking, or a change in consciousness. DIAGNOSIS  A hematoma can usually be diagnosed based on your medical history and a physical exam. Imaging tests may be needed if your health care provider suspects a hematoma in deeper tissues or body spaces, such as the abdomen, head, or chest. These tests may include ultrasonography or a CT scan.  TREATMENT  Hematomas usually go away on their own over time. Rarely does the blood need to be drained out of the body. Large hematomas or those that may affect vital organs will sometimes need surgical drainage or monitoring. HOME CARE INSTRUCTIONS   Apply ice to the injured area:   Put ice in a plastic bag.   Place a towel between your skin and the bag.   Leave the ice on for 20 minutes, 2-3 times a day for the first 1 to 2 days.   After the first 2 days, switch to using warm compresses on the hematoma.   Elevate the injured area to help decrease pain and swelling. Wrapping the area with an elastic bandage may also be helpful. Compression helps to  reduce swelling and promotes shrinking of the hematoma. Make sure the bandage is not wrapped too tight.   If your hematoma is on a lower extremity and is painful, crutches may be helpful for a couple days.   Only take over-the-counter or prescription medicines as directed by your health care provider. SEEK IMMEDIATE MEDICAL CARE IF:   You have increasing pain, or your pain is not controlled with medicine.   You have a fever.   You have worsening swelling or discoloration.   Your skin over the hematoma breaks or starts bleeding.   Your hematoma is in your chest or abdomen and you have weakness, shortness of breath, or a change in consciousness.  Your hematoma is on your scalp (caused by a fall or injury) and you have a worsening headache or a change in alertness or consciousness. MAKE SURE YOU:   Understand these instructions.  Will watch your condition.  Will get help right away if you are not doing well or get worse.   This information is not intended to replace advice given to you by your health care provider. Make sure you discuss any questions you have with your health care  provider.   Document Released: 04/04/2004 Document Revised: 04/23/2013 Document Reviewed: 01/29/2013 Elsevier Interactive Patient Education [employer]

## 2016-03-11 NOTE — ED Notes (Signed)
PA at bedside.

## 2016-03-11 NOTE — ED Notes (Signed)
Bed: WA03 Expected date:  Expected time:  Means of arrival:  Comments: EMS 55yo F fall
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
## 2016-03-13 ENCOUNTER — Encounter (HOSPITAL_COMMUNITY): Payer: Self-pay | Admitting: Dentistry

## 2016-03-30 ENCOUNTER — Encounter (HOSPITAL_COMMUNITY): Payer: Self-pay | Admitting: Emergency Medicine

## 2016-03-30 ENCOUNTER — Emergency Department (HOSPITAL_COMMUNITY)
Admission: EM | Admit: 2016-03-30 | Discharge: 2016-03-31 | Disposition: A | Discharge status: Other Acute Inpatient Hospital | Payer: Medicaid Other | Attending: Emergency Medicine | Admitting: Emergency Medicine

## 2016-03-30 ENCOUNTER — Emergency Department (HOSPITAL_COMMUNITY): Payer: Medicaid Other

## 2016-03-30 DIAGNOSIS — F332 Major depressive disorder, recurrent severe without psychotic features: Secondary | ICD-10-CM | POA: Diagnosis not present

## 2016-03-30 DIAGNOSIS — E039 Hypothyroidism, unspecified: Secondary | ICD-10-CM | POA: Insufficient documentation

## 2016-03-30 DIAGNOSIS — F10920 Alcohol use, unspecified with intoxication, uncomplicated: Secondary | ICD-10-CM

## 2016-03-30 DIAGNOSIS — F919 Conduct disorder, unspecified: Secondary | ICD-10-CM | POA: Insufficient documentation

## 2016-03-30 DIAGNOSIS — F17210 Nicotine dependence, cigarettes, uncomplicated: Secondary | ICD-10-CM | POA: Insufficient documentation

## 2016-03-30 DIAGNOSIS — F329 Major depressive disorder, single episode, unspecified: Secondary | ICD-10-CM | POA: Insufficient documentation

## 2016-03-30 DIAGNOSIS — I10 Essential (primary) hypertension: Secondary | ICD-10-CM | POA: Insufficient documentation

## 2016-03-30 DIAGNOSIS — R45850 Homicidal ideations: Secondary | ICD-10-CM | POA: Diagnosis not present

## 2016-03-30 DIAGNOSIS — F1020 Alcohol dependence, uncomplicated: Secondary | ICD-10-CM | POA: Diagnosis not present

## 2016-03-30 DIAGNOSIS — F10120 Alcohol abuse with intoxication, uncomplicated: Secondary | ICD-10-CM | POA: Insufficient documentation

## 2016-03-30 DIAGNOSIS — F1094 Alcohol use, unspecified with alcohol-induced mood disorder: Secondary | ICD-10-CM

## 2016-03-30 DIAGNOSIS — Z79899 Other long term (current) drug therapy: Secondary | ICD-10-CM | POA: Diagnosis not present

## 2016-03-30 DIAGNOSIS — F1010 Alcohol abuse, uncomplicated: Secondary | ICD-10-CM

## 2016-03-30 LAB — RAPID URINE DRUG SCREEN, HOSP PERFORMED
Amphetamines: NOT DETECTED
Barbiturates: NOT DETECTED
Benzodiazepines: POSITIVE — AB
Cocaine: NOT DETECTED
Opiates: NOT DETECTED
Tetrahydrocannabinol: NOT DETECTED

## 2016-03-30 LAB — COMPREHENSIVE METABOLIC PANEL
ALT: 15 U/L (ref 14–54)
AST: 20 U/L (ref 15–41)
Albumin: 4.4 g/dL (ref 3.5–5.0)
Alkaline Phosphatase: 101 U/L (ref 38–126)
Anion gap: 10 (ref 5–15)
BUN: 8 mg/dL (ref 6–20)
CO2: 24 mmol/L (ref 22–32)
Calcium: 9 mg/dL (ref 8.9–10.3)
Chloride: 99 mmol/L — ABNORMAL LOW (ref 101–111)
Creatinine, Ser: 0.74 mg/dL (ref 0.44–1.00)
GFR calc Af Amer: >60 mL/min (ref >60)
GFR calc non Af Amer: >60 mL/min (ref >60)
Glucose, Bld: 93 mg/dL (ref 65–99)
Potassium: 3.6 mmol/L (ref 3.5–5.1)
Sodium: 133 mmol/L — ABNORMAL LOW (ref 135–145)
Total Bilirubin: 0.6 mg/dL (ref 0.3–1.2)
Total Protein: 7.3 g/dL (ref 6.5–8.1)

## 2016-03-30 LAB — SALICYLATE LEVEL: Salicylate Lvl: <4 mg/dL (ref 2.8–30.0)

## 2016-03-30 LAB — CBC
HCT: 41.4 % (ref 36.0–46.0)
Hemoglobin: 13.8 g/dL (ref 12.0–15.0)
MCH: 28.9 pg (ref 26.0–34.0)
MCHC: 33.3 g/dL (ref 30.0–36.0)
MCV: 86.6 fL (ref 78.0–100.0)
Platelets: 268 10*3/uL (ref 150–400)
RBC: 4.78 MIL/uL (ref 3.87–5.11)
RDW: 13.9 % (ref 11.5–15.5)
WBC: 6.2 10*3/uL (ref 4.0–10.5)

## 2016-03-30 LAB — ACETAMINOPHEN LEVEL: Acetaminophen (Tylenol), Serum: <10 ug/mL — ABNORMAL LOW (ref 10–30)

## 2016-03-30 LAB — ETHANOL: Alcohol, Ethyl (B): 270 mg/dL — ABNORMAL HIGH (ref <5)

## 2016-03-30 MED ORDER — QUETIAPINE FUMARATE ER 50 MG PO TB24
50.0000 mg | ORAL_TABLET | Freq: Every day | ORAL | Status: DC
  Administered 2016-03-30: 50 mg via ORAL
  Filled 2016-03-30: qty 1

## 2016-03-30 MED ORDER — CHLORDIAZEPOXIDE HCL 25 MG PO CAPS: 25.0000 mg | ORAL_CAPSULE | Freq: Every day | ORAL | Status: DC

## 2016-03-30 MED ORDER — ADULT MULTIVITAMIN W/MINERALS CH
1.0000 | ORAL_TABLET | Freq: Every day | ORAL | Status: DC
  Administered 2016-03-30 – 2016-03-31 (×2): 1 via ORAL
  Filled 2016-03-30 (×2): qty 1

## 2016-03-30 MED ORDER — THIAMINE HCL 100 MG/ML IJ SOLN: 100.0000 mg | Freq: Once | INTRAMUSCULAR | Status: DC

## 2016-03-30 MED ORDER — CHLORDIAZEPOXIDE HCL 25 MG PO CAPS: 25.0000 mg | ORAL_CAPSULE | Freq: Three times a day (TID) | ORAL | Status: DC

## 2016-03-30 MED ORDER — AMLODIPINE BESYLATE 10 MG PO TABS
10.0000 mg | ORAL_TABLET | Freq: Every day | ORAL | Status: DC
  Administered 2016-03-30 – 2016-03-31 (×2): 10 mg via ORAL
  Filled 2016-03-30 (×2): qty 1

## 2016-03-30 MED ORDER — CLORAZEPATE DIPOTASSIUM 3.75 MG PO TABS
7.5000 mg | ORAL_TABLET | Freq: Two times a day (BID) | ORAL | Status: DC
  Administered 2016-03-30 – 2016-03-31 (×3): 7.5 mg via ORAL
  Filled 2016-03-30 (×3): qty 2

## 2016-03-30 MED ORDER — LEVOTHYROXINE SODIUM 125 MCG PO TABS
125.0000 ug | ORAL_TABLET | Freq: Every day | ORAL | Status: DC
  Filled 2016-03-30: qty 1

## 2016-03-30 MED ORDER — VITAMIN B-1 100 MG PO TABS
100.0000 mg | ORAL_TABLET | Freq: Every day | ORAL | Status: DC
  Administered 2016-03-30 – 2016-03-31 (×2): 100 mg via ORAL
  Filled 2016-03-30 (×2): qty 1

## 2016-03-30 MED ORDER — ONDANSETRON 4 MG PO TBDP: 4.0000 mg | ORAL_TABLET | Freq: Four times a day (QID) | ORAL | Status: DC | PRN

## 2016-03-30 MED ORDER — LOPERAMIDE HCL 2 MG PO CAPS: 2.0000 mg | ORAL_CAPSULE | ORAL | Status: DC | PRN

## 2016-03-30 MED ORDER — CALCIUM POLYCARBOPHIL 625 MG PO TABS
625.0000 mg | ORAL_TABLET | Freq: Every day | ORAL | Status: DC | PRN
  Filled 2016-03-30: qty 1

## 2016-03-30 MED ORDER — ATENOLOL 50 MG PO TABS
50.0000 mg | ORAL_TABLET | Freq: Every day | ORAL | Status: DC
  Administered 2016-03-30 – 2016-03-31 (×2): 50 mg via ORAL
  Filled 2016-03-30 (×2): qty 1

## 2016-03-30 MED ORDER — CHLORDIAZEPOXIDE HCL 25 MG PO CAPS: 25.0000 mg | ORAL_CAPSULE | Freq: Four times a day (QID) | ORAL | Status: DC | PRN

## 2016-03-30 MED ORDER — POLYETHYLENE GLYCOL 3350 17 G PO PACK
17.0000 g | PACK | Freq: Every day | ORAL | Status: DC | PRN
  Filled 2016-03-30: qty 1

## 2016-03-30 MED ORDER — HYDROXYZINE HCL 25 MG PO TABS: 25.0000 mg | ORAL_TABLET | Freq: Four times a day (QID) | ORAL | Status: DC | PRN

## 2016-03-30 MED ORDER — CHLORDIAZEPOXIDE HCL 25 MG PO CAPS
25.0000 mg | ORAL_CAPSULE | Freq: Four times a day (QID) | ORAL | Status: AC
  Administered 2016-03-30 – 2016-03-31 (×4): 25 mg via ORAL
  Filled 2016-03-30 (×4): qty 1

## 2016-03-30 MED ORDER — CHLORDIAZEPOXIDE HCL 25 MG PO CAPS: 25.0000 mg | ORAL_CAPSULE | ORAL | Status: DC

## 2016-03-30 MED ORDER — QUETIAPINE FUMARATE ER 50 MG PO TB24: 12.5000 mg | ORAL_TABLET | Freq: Every day | ORAL | Status: DC

## 2016-03-30 NOTE — ED Triage Notes (Signed)
Report givne to [place], RN, pt to go to Room 35.
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
## 2016-03-30 NOTE — ED Notes (Signed)
Humes, PA at bedside.  
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
## 2016-03-30 NOTE — ED Triage Notes (Signed)
Pt resting without distress, awakens easliy, VSS, pt without complaints, denies pain, oriented x 4. Sitter at door.

## 2016-03-30 NOTE — ED Notes (Signed)
Patient arrived to unit with no s/s of distress noted. Pt pleasant and cooperative with care and bright on approach. Pt denies SI/HI or plans to harm herself at this time. No needs.
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
## 2016-03-31 ENCOUNTER — Encounter (HOSPITAL_COMMUNITY): Payer: Self-pay

## 2016-03-31 ENCOUNTER — Inpatient Hospital Stay (HOSPITAL_COMMUNITY)
Admission: AD | Admit: 2016-03-31 | Discharge: 2016-04-04 | DRG: 885 | Disposition: A | Discharge status: Home / Self Care | Payer: Medicaid Other | Attending: Psychiatry | Admitting: Psychiatry

## 2016-03-31 DIAGNOSIS — Z79899 Other long term (current) drug therapy: Secondary | ICD-10-CM | POA: Diagnosis not present

## 2016-03-31 DIAGNOSIS — F1020 Alcohol dependence, uncomplicated: Secondary | ICD-10-CM | POA: Diagnosis present

## 2016-03-31 DIAGNOSIS — R45850 Homicidal ideations: Secondary | ICD-10-CM | POA: Diagnosis present

## 2016-03-31 DIAGNOSIS — Z823 Family history of stroke: Secondary | ICD-10-CM

## 2016-03-31 DIAGNOSIS — F411 Generalized anxiety disorder: Secondary | ICD-10-CM | POA: Diagnosis present

## 2016-03-31 DIAGNOSIS — E039 Hypothyroidism, unspecified: Secondary | ICD-10-CM | POA: Diagnosis present

## 2016-03-31 DIAGNOSIS — F1094 Alcohol use, unspecified with alcohol-induced mood disorder: Secondary | ICD-10-CM | POA: Diagnosis not present

## 2016-03-31 DIAGNOSIS — G4700 Insomnia, unspecified: Secondary | ICD-10-CM | POA: Diagnosis present

## 2016-03-31 DIAGNOSIS — K219 Gastro-esophageal reflux disease without esophagitis: Secondary | ICD-10-CM | POA: Diagnosis present

## 2016-03-31 DIAGNOSIS — F332 Major depressive disorder, recurrent severe without psychotic features: Secondary | ICD-10-CM

## 2016-03-31 DIAGNOSIS — Z8249 Family history of ischemic heart disease and other diseases of the circulatory system: Secondary | ICD-10-CM | POA: Diagnosis not present

## 2016-03-31 DIAGNOSIS — I10 Essential (primary) hypertension: Secondary | ICD-10-CM | POA: Diagnosis present

## 2016-03-31 DIAGNOSIS — F17210 Nicotine dependence, cigarettes, uncomplicated: Secondary | ICD-10-CM | POA: Diagnosis present

## 2016-03-31 DIAGNOSIS — J45909 Unspecified asthma, uncomplicated: Secondary | ICD-10-CM | POA: Diagnosis present

## 2016-03-31 DIAGNOSIS — F10120 Alcohol abuse with intoxication, uncomplicated: Secondary | ICD-10-CM | POA: Diagnosis not present

## 2016-03-31 DIAGNOSIS — L309 Dermatitis, unspecified: Secondary | ICD-10-CM | POA: Diagnosis present

## 2016-03-31 DIAGNOSIS — F3163 Bipolar disorder, current episode mixed, severe, without psychotic features: Secondary | ICD-10-CM | POA: Diagnosis present

## 2016-03-31 DIAGNOSIS — Z800 Family history of malignant neoplasm of digestive organs: Secondary | ICD-10-CM

## 2016-03-31 MED ORDER — CHLORDIAZEPOXIDE HCL 25 MG PO CAPS
25.0000 mg | ORAL_CAPSULE | Freq: Three times a day (TID) | ORAL | Status: AC
  Administered 2016-03-31 – 2016-04-01 (×3): 25 mg via ORAL
  Filled 2016-03-31 (×2): qty 1

## 2016-03-31 MED ORDER — CHLORDIAZEPOXIDE HCL 25 MG PO CAPS
25.0000 mg | ORAL_CAPSULE | Freq: Four times a day (QID) | ORAL | Status: AC | PRN
  Filled 2016-03-31: qty 1

## 2016-03-31 MED ORDER — MAGNESIUM HYDROXIDE 400 MG/5ML PO SUSP
30.0000 mL | Freq: Every day | ORAL | Status: DC | PRN
Start: 2016-03-31 — End: 2016-04-04

## 2016-03-31 MED ORDER — HYDROXYZINE HCL 25 MG PO TABS: 25.0000 mg | ORAL_TABLET | Freq: Four times a day (QID) | ORAL | Status: AC | PRN

## 2016-03-31 MED ORDER — VITAMIN B-1 100 MG PO TABS
100.0000 mg | ORAL_TABLET | Freq: Every day | ORAL | Status: DC
  Administered 2016-04-01 – 2016-04-04 (×4): 100 mg via ORAL
  Filled 2016-03-31 (×6): qty 1

## 2016-03-31 MED ORDER — LEVOTHYROXINE SODIUM 125 MCG PO TABS
125.0000 ug | ORAL_TABLET | Freq: Every day | ORAL | Status: DC
  Administered 2016-04-01 – 2016-04-04 (×4): 125 ug via ORAL
  Filled 2016-03-31 (×8): qty 1

## 2016-03-31 MED ORDER — CHLORDIAZEPOXIDE HCL 25 MG PO CAPS
25.0000 mg | ORAL_CAPSULE | Freq: Every day | ORAL | Status: AC
  Administered 2016-04-03: 25 mg via ORAL
  Filled 2016-03-31: qty 1

## 2016-03-31 MED ORDER — CALCIUM POLYCARBOPHIL 625 MG PO TABS
625.0000 mg | ORAL_TABLET | Freq: Every day | ORAL | Status: DC | PRN
  Filled 2016-03-31: qty 1

## 2016-03-31 MED ORDER — POLYETHYLENE GLYCOL 3350 17 G PO PACK: 17.0000 g | PACK | Freq: Every day | ORAL | Status: DC | PRN

## 2016-03-31 MED ORDER — QUETIAPINE FUMARATE ER 50 MG PO TB24
50.0000 mg | ORAL_TABLET | Freq: Every day | ORAL | Status: DC
  Administered 2016-03-31 – 2016-04-02 (×3): 50 mg via ORAL
  Filled 2016-03-31 (×8): qty 1

## 2016-03-31 MED ORDER — ALUM & MAG HYDROXIDE-SIMETH 200-200-20 MG/5ML PO SUSP: 30.0000 mL | ORAL | Status: DC | PRN

## 2016-03-31 MED ORDER — AMLODIPINE BESYLATE 10 MG PO TABS
10.0000 mg | ORAL_TABLET | Freq: Every day | ORAL | Status: DC
  Administered 2016-04-01 – 2016-04-04 (×4): 10 mg via ORAL
  Filled 2016-03-31 (×2): qty 1
  Filled 2016-03-31: qty 2
  Filled 2016-03-31 (×4): qty 1

## 2016-03-31 MED ORDER — ATENOLOL 50 MG PO TABS
50.0000 mg | ORAL_TABLET | Freq: Every day | ORAL | Status: DC
  Administered 2016-04-01 – 2016-04-04 (×4): 50 mg via ORAL
  Filled 2016-03-31 (×5): qty 1
  Filled 2016-03-31: qty 2
  Filled 2016-03-31: qty 1

## 2016-03-31 MED ORDER — ADULT MULTIVITAMIN W/MINERALS CH
1.0000 | ORAL_TABLET | Freq: Every day | ORAL | Status: DC
  Administered 2016-04-01 – 2016-04-04 (×4): 1 via ORAL
  Filled 2016-03-31 (×6): qty 1

## 2016-03-31 MED ORDER — ONDANSETRON 4 MG PO TBDP
4.0000 mg | ORAL_TABLET | Freq: Four times a day (QID) | ORAL | Status: AC | PRN
Start: 2016-03-31 — End: 2016-04-02

## 2016-03-31 MED ORDER — CHLORDIAZEPOXIDE HCL 25 MG PO CAPS
25.0000 mg | ORAL_CAPSULE | ORAL | Status: AC
  Administered 2016-04-01 – 2016-04-02 (×2): 25 mg via ORAL
  Filled 2016-03-31 (×2): qty 1

## 2016-03-31 MED ORDER — LOPERAMIDE HCL 2 MG PO CAPS: 2.0000 mg | ORAL_CAPSULE | ORAL | Status: AC | PRN

## 2016-03-31 MED ORDER — ACETAMINOPHEN 325 MG PO TABS: 650.0000 mg | ORAL_TABLET | Freq: Four times a day (QID) | ORAL | Status: DC | PRN
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
## 2016-03-31 NOTE — BH Assessment (Signed)
[facility] Assessment Progress Note  Per Thedore Mins, MD, this pt requires psychiatric hospitalization at this time.  Berneice Heinrich, RN, [facility] has assigned pt to [facility] Rm 300-2.  Pt presents under IVC, and IVC documents have been faxed to [facility].  Pt's nurse, Kendal Hymen has been notified, and agrees to call report to [phone].  Pt is to be transported via [employer]. Doylene Canning, MA Triage Specialist [phone]

## 2016-03-31 NOTE — Tx Team (Signed)
Initial Interdisciplinary Treatment Plan   PATIENT STRESSORS: Health problems Marital or family conflict Substance abuse Traumatic event   PATIENT STRENGTHS: Active sense of humor Motivation for treatment/growth Supportive family/friends   PROBLEM LIST: Problem List/Patient Goals Date to be addressed Date deferred Reason deferred Estimated date of resolution  "change my attitude to a better one" 03/31/16     "control my drinking and cigerette" 03/31/16     Substance abuse 03/31/16     Depression 03/31/16                                    DISCHARGE CRITERIA:  Adequate post-discharge living arrangements Improved stabilization in mood, thinking, and/or behavior Medical problems require only outpatient monitoring Motivation to continue treatment in a less acute level of care  PRELIMINARY DISCHARGE PLAN: Attend aftercare/continuing care group Attend PHP/IOP Attend 12-step recovery group Outpatient therapy  PATIENT/FAMIILY INVOLVEMENT: This treatment plan has been presented to and reviewed with the patient, [name], and/or family member.  The patient and family have been given the opportunity to ask questions and make suggestions.  Bethann Punches 03/31/2016, 4:35 PM

## 2016-03-31 NOTE — ED Notes (Signed)
Pt discharged safely with [employer].  All belongings were sent with patient.  Pt was in no distress at discharge. 

## 2016-03-31 NOTE — Progress Notes (Signed)
Adult Psychoeducational Group Note  Date:  03/31/2016 Time:  9:49 PM  Group Topic/Focus:  Wrap-Up Group:   The focus of this group is to help patients review their daily goal of treatment and discuss progress on daily workbooks.   Participation Level:  Active  Participation Quality:  Patient attended group  Affect:  Pt. attended group  Cognitive:  Pt. attended group  Insight: None  Engagement in Group:  Patient attended group  Modes of Intervention:  Patient attended group  Additional Comments: Patient attended AA group. Loida Calamia W Gregori Abril 03/31/2016, 9:49 PM
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
## 2016-04-01 DIAGNOSIS — R45850 Homicidal ideations: Secondary | ICD-10-CM

## 2016-04-01 DIAGNOSIS — F332 Major depressive disorder, recurrent severe without psychotic features: Secondary | ICD-10-CM
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
## 2016-04-01 NOTE — Progress Notes (Signed)
D: Denies SI/HI/AVH at this time. Contracts for safety.  A: Encouragement and support given. Q15 minute room checks for patient safety. Medications administered as prescribed.  R: Continue to monitor for patient safety and medication effectiveness. 

## 2016-04-01 NOTE — [facility] Group Notes (Signed)
[facility] Group Notes:  (Clinical Social Work)   07/03/2015     10:00-11:00AM  Summary of Progress/Problems:   In today's process group a decisional balance exercise was used to explore in depth the perceived benefits and costs of alcohol and drugs, as well as the  benefits and costs of replacing these with healthy coping skills.  Patients listed healthy and unhealthy coping techniques, determining with CSW guidance that unhealthy coping techniques work initially, but eventually become harmful.  Motivational Interviewing and the whiteboard were utilized for the exercises.  The patient expressed that the healthy coping she often uses is meditation with music.  She was called out to see a provider, and when she returned she was very drowsy, actually fell asleep at one point.  Type of Therapy:  Group Therapy - Process   Participation Level:  Minimal  Participation Quality:  Drowsy  Affect:  Blunted  Cognitive:  Oriented  Insight:  Limited  Engagement in Therapy:  Limited  Modes of Intervention:  Education, Motivational Interviewing  Ambrose Mantle, LCSW 04/01/2016, 12:36 PM

## 2016-04-01 NOTE — Progress Notes (Signed)
D: Patient's self inventory sheet: patient has fair sleep, no sleep medication.good  Appetite, normal energy level, good concentration. Rated depression 5/10, hopeless 3/10, anxiety 3/10. SI/HI/AVH: denies. Physical complaints are denied. Goal is "to get all of my medication". Plans to work on "think more positive".   A: Medications administered, assessed medication knowledge and education given on medication regimen.  Emotional support and encouragement given patient. R: Denies SI and HI , contracts for safety. Safety maintained with 15 minute checks.
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
## 2016-04-02 NOTE — Progress Notes (Signed)
D: Patient's self inventory sheet: patient has fair sleep, no sleep medication.good  Appetite, normal energy level, unk concentration. Rated depression 4/10, hopeless 1/10, anxiety 3/10. SI/HI/AVH: denies. Physical complaints are chills as withdrawal symptom, feeling lightheaded and pain in legs, arms, knees and back but does not request pain medication. Goal is "upbeat". Plans to work on "remain positive".   A: Medications administered, assessed medication knowledge and education given on medication regimen.  Emotional support and encouragement given patient. R: Deneis SI and HI , contracts for safety. Safety maintained with 15 minute checks.

## 2016-04-02 NOTE — Progress Notes (Signed)
Patient did attend the evening speaker AA meeting.  

## 2016-04-02 NOTE — Progress Notes (Signed)
[facility] MD Progress Note  04/02/2016 11:55 AM [name]  MRN:  [id number] Subjective: patient reports " I am ready to go and sleep in my own bed."  Objective:[name] is awake, alert and oriented X4. seen attending group session.  Denies suicidal or homicidal ideation. Denies auditory or visual hallucination and does not appear to be responding to internal stimuli. Patient reports interacting well with staff and others. Patient reports she is medication compliant without mediation side effects. Patient reports the Ativan is making her feel "high." Patient denies depression or depressive symptoms at this time. Reports good appetite and states she is resting okay. Reports the beds are uncomfortable. Patient report she is excited regarding discharge on Monday. Support, encouragement and reassurance was provided.   Principal Problem: Major depressive disorder, recurrent severe without psychotic features (HCC) Diagnosis:   Patient Active Problem List   Diagnosis Date Noted  . Severe recurrent major depression without psychotic features (HCC) [F33.2] 03/31/2016  . Major depressive disorder, recurrent severe without psychotic features (HCC) [F33.2] 03/31/2016  . Alcohol use disorder, severe, dependence (HCC) [F10.20] 03/30/2016  . Alcohol abuse [F10.10] 01/09/2016  . Alcohol-induced mood disorder (HCC) [F10.94] 01/09/2016  . Diverticulitis [K57.92] 08/15/2015  . Diverticulitis of large intestine with abscess  [K57.20] 11/23/2014  . Acute diverticulitis [K57.92] 11/23/2014  . Leukocytosis [D72.829] 11/17/2014  . Essential hypertension [I10] 11/17/2014  . Heart murmur [R01.1]   . Chronic bronchitis (HCC) [J42]   . Hypothyroidism [E03.9]   . Anxiety [F41.9]   . Depression [F32.9]   . Mental disorder [F99]   . GERD (gastroesophageal reflux disease) [K21.9]   . Simple chronic bronchitis (HCC) [J41.0]    Total Time spent with patient: 30 minutes  Past Psychiatric History: See  Above  Past Medical History:  Past Medical History:  Diagnosis Date  . Anemia   . Anxiety   . Arthritis   . Bursitis    hx of  . Chronic bronchitis (HCC)   . Complication of anesthesia    difficult to wake up and blood pressure drops  . Depression   . Diverticula of intestine   . Dysrhythmia    no medication treatment  . Eczema   . GERD (gastroesophageal reflux disease)   . Headache(784.0)   . Heart murmur   . Hypertension    sees Dr. Shana Chute, is patients primary  . Hypothyroidism    takes synthroid  . Mental disorder    bipolar  . Pneumonia    hx    Past Surgical History:  Procedure Laterality Date  . ABDOMINAL HYSTERECTOMY     has both of ovaries  . CESAREAN SECTION    . COLONOSCOPY    . LUMBAR LAMINECTOMY  08/22/2011   Procedure: MICRODISCECTOMY LUMBAR LAMINECTOMY;  Surgeon: Dorian Heckle, MD;  Location: [facility];  Service: Neurosurgery;  Laterality: Right;  RIGHT Lumbar Five-Sacral One microdiskectomy  . MOUTH SURGERY    . MULTIPLE EXTRACTIONS WITH ALVEOLOPLASTY N/A 08/20/2015   Procedure: Extraction of tooth #'s 1-12,14,20-27, 29 with alveoloplasty;  Surgeon: Charlynne Pander, DDS;  Location: [facility] OR;  Service: Oral Surgery;  Laterality: N/A;  . TUBAL LIGATION     Family History:  Family History  Problem Relation Age of Onset  . Anesthesia problems Mother   . Cancer Mother     thyroid  . Hypertension Mother   . Anesthesia problems Sister   . Hypertension Sister   . Cancer Maternal Aunt   . Cancer Maternal  Aunt   . Colon cancer Sister   . Stroke Sister   . Hypertension Brother    Family Psychiatric  History: See Above Social History:  History  Alcohol Use  . 0.0 oz/week    Comment: occ     History  Drug Use No    Social History   Social History  . Marital status: Single    Spouse name: N/A  . Number of children: N/A  . Years of education: N/A   Social History Main Topics  . Smoking status: Current Every Day Smoker    Packs/day:  0.25    Years: 20.00    Types: Cigarettes  . Smokeless tobacco: Never Used     Comment: 4-6 cigs/day  . Alcohol use 0.0 oz/week     Comment: occ  . Drug use: No  . Sexual activity: No   Other Topics Concern  . None   Social History Narrative  . None   Additional Social History:    Pain Medications: See PTA Prescriptions: See PTA Over the Counter: See PTA History of alcohol / drug use?: Yes Negative Consequences of Use: Personal relationships Withdrawal Symptoms: Tremors                    Sleep: Fair  Appetite:  Fair  Current Medications: Current Facility-Administered Medications  Medication Dose Route Frequency Provider Last Rate Last Dose  . acetaminophen (TYLENOL) tablet 650 mg  650 mg Oral Q6H PRN Charm Rings, NP      . alum & mag hydroxide-simeth (MAALOX/MYLANTA) 200-200-20 MG/5ML suspension 30 mL  30 mL Oral Q4H PRN Charm Rings, NP      . amLODipine (NORVASC) tablet 10 mg  10 mg Oral Daily Charm Rings, NP   10 mg at 04/02/16 0827  . atenolol (TENORMIN) tablet 50 mg  50 mg Oral Daily Charm Rings, NP   50 mg at 04/02/16 0827  . chlordiazePOXIDE (LIBRIUM) capsule 25 mg  25 mg Oral Q6H PRN Charm Rings, NP      . Melene Muller ON 04/03/2016] chlordiazePOXIDE (LIBRIUM) capsule 25 mg  25 mg Oral Daily Charm Rings, NP      . hydrOXYzine (ATARAX/VISTARIL) tablet 25 mg  25 mg Oral Q6H PRN Charm Rings, NP      . levothyroxine (SYNTHROID, LEVOTHROID) tablet 125 mcg  125 mcg Oral QAC breakfast Charm Rings, NP   125 mcg at 04/02/16 1610  . loperamide (IMODIUM) capsule 2-4 mg  2-4 mg Oral PRN Charm Rings, NP      . magnesium hydroxide (MILK OF MAGNESIA) suspension 30 mL  30 mL Oral Daily PRN Charm Rings, NP      . multivitamin with minerals tablet 1 tablet  1 tablet Oral Daily Charm Rings, NP   1 tablet at 04/02/16 0825  . ondansetron (ZOFRAN-ODT) disintegrating tablet 4 mg  4 mg Oral Q6H PRN Charm Rings, NP      . polycarbophil (FIBERCON)  tablet 625 mg  625 mg Oral Daily PRN Charm Rings, NP      . polyethylene glycol (MIRALAX / GLYCOLAX) packet 17 g  17 g Oral Daily PRN Charm Rings, NP      . QUEtiapine (SEROQUEL XR) 24 hr tablet 50 mg  50 mg Oral QHS Charm Rings, NP   50 mg at 04/01/16 2125  . thiamine (VITAMIN B-1) tablet 100 mg  100 mg Oral Daily Catha Nottingham  Kennyth Lose, NP   100 mg at 04/02/16 0825    Lab Results: No results found for this or any previous visit (from the past 48 hour(s)).  Blood Alcohol level:  Lab Results  Component Value Date   ETH 270 (H) 03/30/2016   ETH 270 (H) 01/08/2016    Metabolic Disorder Labs: No results found for: HGBA1C, MPG No results found for: PROLACTIN No results found for: CHOL, TRIG, HDL, CHOLHDL, VLDL, LDLCALC  Physical Findings: AIMS: Facial and Oral Movements Muscles of Facial Expression: None, normal Lips and Perioral Area: None, normal Jaw: None, normal Tongue: None, normal,Extremity Movements Upper (arms, wrists, hands, fingers): None, normal Lower (legs, knees, ankles, toes): None, normal, Trunk Movements Neck, shoulders, hips: None, normal, Overall Severity Severity of abnormal movements (highest score from questions above): None, normal Incapacitation due to abnormal movements: None, normal Patient's awareness of abnormal movements (rate only patient's report): No Awareness, Dental Status Current problems with teeth and/or dentures?: Yes Does patient usually wear dentures?: Yes  CIWA:  CIWA-Ar Total: 1 COWS:  COWS Total Score: 0  Musculoskeletal: Strength & Muscle Tone: within normal limits Gait & Station: normal Patient leans: N/A  Psychiatric Specialty Exam: Physical Exam  Vitals reviewed. Constitutional: She is oriented to person, place, and time. She appears well-developed.  Neck: Normal range of motion.  Musculoskeletal: Normal range of motion.  Neurological: She is alert and oriented to person, place, and time.  Psychiatric: She has a normal mood  and affect. Her behavior is normal.    Review of Systems  Psychiatric/Behavioral: Negative.  Negative for depression and hallucinations. The patient does not have insomnia.     Blood pressure 134/61, pulse (!) 52, temperature 98.5 F (36.9 C), temperature source Oral, resp. rate 16, height 5\' 7"  (1.702 m), weight 97.1 kg (214 lb), SpO2 100 %.Body mass index is 33.52 kg/m.  General Appearance: Casual  Eye Contact:  Good  Speech:  Clear and Coherent  Volume:  Normal  Mood:  Euthymic  Affect:  Congruent  Thought Process:  Linear  Orientation:  Full (Time, Place, and Person)  Thought Content:  Hallucinations: None  Suicidal Thoughts:  No  Homicidal Thoughts:  No denies thought to harm or hurt her boyfriend.  Memory:  Immediate;   Fair Recent;   Fair Remote;   Fair  Judgement:  Fair  Insight:  Fair  Psychomotor Activity:  Normal  Concentration:  Concentration: Fair  Recall:  [employer] of Knowledge:  Fair  Language:  Good  Akathisia:  Yes  Handed:  Right  AIMS (if indicated):     Assets:  Communication Skills Desire for Improvement Resilience Social Support  ADL's:  Intact  Cognition:  WNL  Sleep:  Number of Hours: 5     I agree with current treatment plan on 07/30//2017, Patient seen face-to-face for psychiatric evaluation follow-up, chart reviewed. Reviewed the information documented and agree with the treatment plan.  Treatment Plan Summary: Daily contact with patient to assess and evaluate symptoms and progress in treatment and Medication management  Patient to continue Tranxene 7.5mg  after detox protocol is complete. Continue with Seroquel 50 mg for mood stabilization/ insomnia. Started on CWIA/ Librium Protocol Will continue to monitor vitals ,medication compliance and treatment side effects while patient is here.  Reviewed labs: BAL - 270, UDS - positive for  benzodiazepines. CSW will start working on disposition.  Patient to participate in therapeutic  milieu   Oneta Rack, NP 04/02/2016, 11:55 AM  Patient  seen face-to-face for psychiatric evaluation, chart reviewed and case discussed with the physician extender and developed treatment plan. Reviewed the information documented and agree with the treatment plan. Corena Pilgrim, MD
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
## 2016-04-02 NOTE — Progress Notes (Signed)
D: Patient pleasant and cooperative with care this shift and is noted to have a bright affect when interacting with staff/peers in the milieu. Pt participated in group session and was compliant with HS medications. A: Q 15 minute safety checks, encourage group participation and med compliance. R: Pt denies SI. No s/s of distress noted this shift.

## 2016-04-03 DIAGNOSIS — F3163 Bipolar disorder, current episode mixed, severe, without psychotic features: Principal | ICD-10-CM

## 2016-04-03 MED ORDER — HYDROXYZINE HCL 25 MG PO TABS: 25.0000 mg | ORAL_TABLET | Freq: Four times a day (QID) | ORAL | Status: DC | PRN

## 2016-04-03 MED ORDER — QUETIAPINE FUMARATE ER 50 MG PO TB24
100.0000 mg | ORAL_TABLET | Freq: Every day | ORAL | Status: DC
  Administered 2016-04-03: 100 mg via ORAL
  Filled 2016-04-03 (×3): qty 2

## 2016-04-03 MED ORDER — LAMOTRIGINE 25 MG PO TABS
25.0000 mg | ORAL_TABLET | Freq: Every day | ORAL | Status: DC
  Administered 2016-04-03 – 2016-04-04 (×2): 25 mg via ORAL
  Filled 2016-04-03 (×4): qty 1

## 2016-04-03 NOTE — Plan of Care (Signed)
Problem: Coping: Goal: Ability to cope will improve Outcome: Progressing Pt sharing with staff and peers and participating in group session.

## 2016-04-03 NOTE — Progress Notes (Addendum)
[facility] MD Progress Note  04/03/2016 1:40 PM [name]  MRN:  [id number] Subjective: patient reports " I was diagnosed with Bipolar disorder years ago. I started drinking too much and that is how I ended up here."  Objective:Tien L Burrowes is awake, alert and oriented X4. seen attending group session.   Patient seen and chart reviewed. Pt today seen with pressured speech , tangential thought process , appears labile , hyperactive . Pt reports she was abusing a lot of alcohol , however wants to get help now. Pt reports being on seroquel and Tranxene in the past by [facility].  Pt agrees to be started on Lamictal for mood sx. Per staff pt continues to need encouragement and support.    Principal Problem: Bipolar disorder, curr episode mixed, severe, w/o psychotic features (HCC) Diagnosis:   Patient Active Problem List   Diagnosis Date Noted  . Bipolar disorder, curr episode mixed, severe, w/o psychotic features (HCC) [F31.63] 04/03/2016  . Alcohol use disorder, severe, dependence (HCC) [F10.20] 03/30/2016  . Alcohol abuse [F10.10] 01/09/2016  . Alcohol-induced mood disorder (HCC) [F10.94] 01/09/2016  . Diverticulitis [K57.92] 08/15/2015  . Diverticulitis of large intestine with abscess  [K57.20] 11/23/2014  . Acute diverticulitis [K57.92] 11/23/2014  . Leukocytosis [D72.829] 11/17/2014  . Essential hypertension [I10] 11/17/2014  . Heart murmur [R01.1]   . Chronic bronchitis (HCC) [J42]   . Hypothyroidism [E03.9]   . Anxiety [F41.9]   . Depression [F32.9]   . Mental disorder [F99]   . GERD (gastroesophageal reflux disease) [K21.9]   . Simple chronic bronchitis (HCC) [J41.0]    Total Time spent with patient: 25 minutes  Past Psychiatric History: Please see H&P.   Past Medical History:  Past Medical History:  Diagnosis Date  . Anemia   . Anxiety   . Arthritis   . Bursitis    hx of  . Chronic bronchitis (HCC)   . Complication of anesthesia    difficult to wake up and  blood pressure drops  . Depression   . Diverticula of intestine   . Dysrhythmia    no medication treatment  . Eczema   . GERD (gastroesophageal reflux disease)   . Headache(784.0)   . Heart murmur   . Hypertension    sees Dr. Shana Chute, is patients primary  . Hypothyroidism    takes synthroid  . Mental disorder    bipolar  . Pneumonia    hx    Past Surgical History:  Procedure Laterality Date  . ABDOMINAL HYSTERECTOMY     has both of ovaries  . CESAREAN SECTION    . COLONOSCOPY    . LUMBAR LAMINECTOMY  08/22/2011   Procedure: MICRODISCECTOMY LUMBAR LAMINECTOMY;  Surgeon: Dorian Heckle, MD;  Location: [facility];  Service: Neurosurgery;  Laterality: Right;  RIGHT Lumbar Five-Sacral One microdiskectomy  . MOUTH SURGERY    . MULTIPLE EXTRACTIONS WITH ALVEOLOPLASTY N/A 08/20/2015   Procedure: Extraction of tooth #'s 1-12,14,20-27, 29 with alveoloplasty;  Surgeon: Charlynne Pander, DDS;  Location: [facility] OR;  Service: Oral Surgery;  Laterality: N/A;  . TUBAL LIGATION     Family History:  Family History  Problem Relation Age of Onset  . Anesthesia problems Mother   . Cancer Mother     thyroid  . Hypertension Mother   . Anesthesia problems Sister   . Hypertension Sister   . Cancer Maternal Aunt   . Cancer Maternal Aunt   . Colon cancer Sister   . Stroke  Sister   . Hypertension Brother    Family Psychiatric  History: Please see H&P.  Social History:  History  Alcohol Use  . 0.0 oz/week    Comment: occ     History  Drug Use No    Social History   Social History  . Marital status: Single    Spouse name: N/A  . Number of children: N/A  . Years of education: N/A   Social History Main Topics  . Smoking status: Current Every Day Smoker    Packs/day: 0.25    Years: 20.00    Types: Cigarettes  . Smokeless tobacco: Never Used     Comment: 4-6 cigs/day  . Alcohol use 0.0 oz/week     Comment: occ  . Drug use: No  . Sexual activity: No   Other Topics Concern  .  None   Social History Narrative  . None   Additional Social History:    Pain Medications: See PTA Prescriptions: See PTA Over the Counter: See PTA History of alcohol / drug use?: Yes Negative Consequences of Use: Personal relationships Withdrawal Symptoms: Tremors                    Sleep: Fair  Appetite:  Fair  Current Medications: Current Facility-Administered Medications  Medication Dose Route Frequency Provider Last Rate Last Dose  . acetaminophen (TYLENOL) tablet 650 mg  650 mg Oral Q6H PRN Charm Rings, NP      . alum & mag hydroxide-simeth (MAALOX/MYLANTA) 200-200-20 MG/5ML suspension 30 mL  30 mL Oral Q4H PRN Charm Rings, NP      . amLODipine (NORVASC) tablet 10 mg  10 mg Oral Daily Charm Rings, NP   10 mg at 04/03/16 0818  . atenolol (TENORMIN) tablet 50 mg  50 mg Oral Daily Charm Rings, NP   50 mg at 04/03/16 1610  . hydrOXYzine (ATARAX/VISTARIL) tablet 25 mg  25 mg Oral Q6H PRN Jomarie Longs, MD      . lamoTRIgine (LAMICTAL) tablet 25 mg  25 mg Oral Daily Bryndon Cumbie, MD      . levothyroxine (SYNTHROID, LEVOTHROID) tablet 125 mcg  125 mcg Oral QAC breakfast Charm Rings, NP   125 mcg at 04/03/16 9604  . magnesium hydroxide (MILK OF MAGNESIA) suspension 30 mL  30 mL Oral Daily PRN Charm Rings, NP      . multivitamin with minerals tablet 1 tablet  1 tablet Oral Daily Charm Rings, NP   1 tablet at 04/03/16 0815  . polycarbophil (FIBERCON) tablet 625 mg  625 mg Oral Daily PRN Charm Rings, NP      . polyethylene glycol (MIRALAX / GLYCOLAX) packet 17 g  17 g Oral Daily PRN Charm Rings, NP      . QUEtiapine (SEROQUEL XR) 24 hr tablet 50 mg  50 mg Oral QHS Charm Rings, NP   50 mg at 04/02/16 2120  . thiamine (VITAMIN B-1) tablet 100 mg  100 mg Oral Daily Charm Rings, NP   100 mg at 04/03/16 5409    Lab Results: No results found for this or any previous visit (from the past 48 hour(s)).  Blood Alcohol level:  Lab Results  Component  Value Date   ETH 270 (H) 03/30/2016   ETH 270 (H) 01/08/2016    Metabolic Disorder Labs: No results found for: HGBA1C, MPG No results found for: PROLACTIN No results found for: CHOL, TRIG, HDL, CHOLHDL,  VLDL, LDLCALC  Physical Findings: AIMS: Facial and Oral Movements Muscles of Facial Expression: None, normal Lips and Perioral Area: None, normal Jaw: None, normal Tongue: None, normal,Extremity Movements Upper (arms, wrists, hands, fingers): None, normal Lower (legs, knees, ankles, toes): None, normal, Trunk Movements Neck, shoulders, hips: None, normal, Overall Severity Severity of abnormal movements (highest score from questions above): None, normal Incapacitation due to abnormal movements: None, normal Patient's awareness of abnormal movements (rate only patient's report): No Awareness, Dental Status Current problems with teeth and/or dentures?: No Does patient usually wear dentures?: No  CIWA:  CIWA-Ar Total: 0 COWS:  COWS Total Score: 0  Musculoskeletal: Strength & Muscle Tone: within normal limits Gait & Station: normal Patient leans: N/A  Psychiatric Specialty Exam: Physical Exam  Vitals reviewed. Constitutional: She is oriented to person, place, and time. She appears well-developed.  Neck: Normal range of motion.  Musculoskeletal: Normal range of motion.  Neurological: She is alert and oriented to person, place, and time.  Psychiatric: She has a normal mood and affect. Her behavior is normal.    Review of Systems  Psychiatric/Behavioral: Positive for depression and substance abuse. Negative for hallucinations. The patient is nervous/anxious. The patient does not have insomnia.   All other systems reviewed and are negative.   Blood pressure 122/61, pulse 73, temperature 98.6 F (37 C), temperature source Oral, resp. rate 16, height  (1.702 m), weight 97.1 kg (214 lb), SpO2 100 %.Body mass index is 33.52 kg/m.  General Appearance: Casual  Eye Contact:  Good   Speech:  Pressured  Volume:  Normal  Mood:  Anxious and Euphoric  Affect:  Congruent  Thought Process:  Coherent and Descriptions of Associations: Tangential  Orientation:  Full (Time, Place, and Person)  Thought Content:  Rumination and Tangential  Suicidal Thoughts:  No  Homicidal Thoughts:  No denies thought to harm or hurt her boyfriend.  Memory:  Immediate;   Fair Recent;   Fair Remote;   Fair  Judgement:  Fair  Insight:  Fair  Psychomotor Activity:  Increased  Concentration:  Concentration: Fair  Recall:  [employer] of Knowledge:  Fair  Language:  Good  Akathisia:  Yes  Handed:  Right  AIMS (if indicated):     Assets:  Communication Skills Desire for Improvement Resilience Social Support  ADL's:  Intact  Cognition:  WNL  Sleep:  Number of Hours: 6     Treatment Plan Summary:Patient presented with mood sx as well as HI towards boyfriend. Pt continues to be labile , anxious , will continue treatment. Daily contact with patient to assess and evaluate symptoms and progress in treatment and Medication management  Will increase seroquel to 100 mg po qhs for mood sx and sleep. Will add Lamictal 25 mg po daily for mood sx. Will continue Librium protocol for alcohol withdrawal sx. Will get lipid panel, hba1c, tsh. Reviewed past medical records,treatment plan. Restarted home  Medications where indicated. Will continue to monitor vitals ,medication compliance and treatment side effects while patient is here.  Will monitor for medical issues as well as call consult as needed.  CSW will continue working on disposition. Pt is motivated to get help with her substance abuse. Patient to participate in therapeutic milieu .      Susanne Baumgarner, MD 04/03/2016, 1:40 PM

## 2016-04-03 NOTE — Progress Notes (Signed)
D:Patient up and visible in the milieu. Spoke with patient 1:1. Rates sleep, appetite, energy and concentration all as good. Patient's affect appropriate with congruent mood. Rating her depression at a 1/10, hopelessness at a 0/10 and anxiety at a 1/10. States goal for today is to "keep being happy." Denies pain, physical problems.   A: Medicated per orders, no prns requested or given. Emotional support offered and self inventory reviewed.   R: Pt verbalizes understanding. Patient denies SI/HI and remains safe on level III obs.

## 2016-04-03 NOTE — Progress Notes (Signed)
D: [name] denies Depression and Anxiety. States she is ready to go home. Denies SI/HI/AVH at this time. Contracts for safety.  A: Encouragement and support given. Q15 minute room checks for patient safety. Medications administered as prescribed.  R: Continue to monitor for patient safety and medication effectiveness.

## 2016-04-03 NOTE — Progress Notes (Signed)
Recreation Therapy Notes  Date: 04/03/16 Time: 0930 Location: 300 Hall Group Room  Group Topic: Stress Management  Goal Area(s) Addresses:  Patient will verbalize importance of using healthy stress management.  Patient will identify positive emotions associated with healthy stress management.   Behavioral Response: Engaged  Intervention: Stress Management  Activity :  Healthy Boundaries Guided Imagery.  LRT introduced the technique of guided imagery to the patients.  Pt were to follow along as the LRT read the script to engaged in the activity.    Education:  Stress Management, Discharge Planning.   Education Outcome: Acknowledges edcuation/In group clarification offered/Needs additional education  Clinical Observations/Feedback: Pt attended group.   Caroll Rancher, LRT/CTRS    Caroll Rancher A 04/03/2016 12:29 PM

## 2016-04-03 NOTE — Progress Notes (Signed)
[facility] Group Notes:  (Nursing/MHT/Case Management/Adjunct)  Date:  04/03/2016  Time:  11:24 PM  Type of Therapy:  Psychoeducational Skills  Participation Level:  Active  Participation Quality:  Appropriate  Affect:  Appropriate  Cognitive:  Appropriate  Insight:  Appropriate  Engagement in Group:  Engaged  Modes of Intervention:  Education  Summary of Progress/Problems: The patient stated in group this evening that she had a good day overall since she took the time to say good bye to those who were discharged and are progressing with their treatment. As for the theme of the day, her wellness strategy will be to wear her glasses upon discharge.   Hazle Coca S 04/03/2016, 11:24 PM

## 2016-04-03 NOTE — [facility] Group Notes (Signed)
[facility] LCSW Group Therapy  04/03/2016 4:09 PM  Type of Therapy:  Group Therapy  Participation Level:  Active  Participation Quality:  Attentive  Affect:  Appropriate  Cognitive:  Alert and Oriented  Insight:  Improving  Engagement in Therapy:  Improving  Modes of Intervention:  Confrontation, Discussion, Education, Exploration, Problem-solving, Rapport Building, Socialization and Support  Summary of Progress/Problems: Today's Topic: Overcoming Obstacles. Patients identified one short term goal and potential obstacles in reaching this goal. Patients processed barriers involved in overcoming these obstacles. Patients identified steps necessary for overcoming these obstacles and explored motivation (internal and external) for facing these difficulties head on. [name] was attentive and engaged during today's processing group. She shared that her biggest obstacle was "getting back home." Patient reports that she plans to continue seeing her CST at discharge and reports "the people here are like a second family. They lifted me up when I felt down."     Smart, Brooke Spader LCSW 04/03/2016, 4:09 PM
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
## 2016-04-03 NOTE — Plan of Care (Signed)
Problem: Safety: Goal: Ability to remain free from injury will improve Outcome: Progressing Patient has not engaged in self harm. Denies SI.  Problem: Medication: Goal: Compliance with prescribed medication regimen will improve Outcome: Progressing Patient med compliant.

## 2016-04-04 LAB — TSH: TSH: 18.919 u[IU]/mL — ABNORMAL HIGH (ref 0.350–4.500)

## 2016-04-04 LAB — LIPID PANEL
Cholesterol: 163 mg/dL (ref 0–200)
HDL: 69 mg/dL (ref >40)
LDL Cholesterol: 80 mg/dL (ref 0–99)
Total CHOL/HDL Ratio: 2.4 RATIO
Triglycerides: 68 mg/dL (ref <150)
VLDL: 14 mg/dL (ref 0–40)

## 2016-04-04 MED ORDER — POLYETHYLENE GLYCOL 3350 17 G PO PACK: 17.0000 g | PACK | Freq: Every day | ORAL | 0 refills | Status: AC | PRN

## 2016-04-04 MED ORDER — HYDROXYZINE HCL 25 MG PO TABS: 25.0000 mg | ORAL_TABLET | Freq: Four times a day (QID) | ORAL | 0 refills | Status: DC | PRN

## 2016-04-04 MED ORDER — QUETIAPINE FUMARATE ER 50 MG PO TB24: 100.0000 mg | ORAL_TABLET | Freq: Every day | ORAL | 0 refills | Status: DC

## 2016-04-04 MED ORDER — LAMOTRIGINE 25 MG PO TABS: 25.0000 mg | ORAL_TABLET | Freq: Every day | ORAL | 0 refills | Status: DC

## 2016-04-04 MED ORDER — CALCIUM POLYCARBOPHIL 625 MG PO TABS: 625.0000 mg | ORAL_TABLET | Freq: Every day | ORAL | Status: AC | PRN

## 2016-04-04 MED ORDER — LEVOTHYROXINE SODIUM 125 MCG PO TABS: 125.0000 ug | ORAL_TABLET | Freq: Every day | ORAL | 0 refills | Status: AC

## 2016-04-04 MED ORDER — AMLODIPINE BESYLATE 10 MG PO TABS: 10.0000 mg | ORAL_TABLET | Freq: Every day | ORAL | 0 refills | Status: AC

## 2016-04-04 MED ORDER — ALBUTEROL SULFATE HFA 108 (90 BASE) MCG/ACT IN AERS: 2.0000 | INHALATION_SPRAY | RESPIRATORY_TRACT | 0 refills | Status: AC | PRN

## 2016-04-04 MED ORDER — ATENOLOL 50 MG PO TABS: 50.0000 mg | ORAL_TABLET | Freq: Every day | ORAL | 0 refills | Status: AC

## 2016-04-04 NOTE — Progress Notes (Signed)
  [facility] Adult Case Management Discharge Plan :  Will you be returning to the same living situation after discharge:  Yes,  home At discharge, do you have transportation home?: Yes,  taxi voucher in chart Do you have the ability to pay for your medications: Yes,  [facility]  Release of information consent forms completed and submitted to medical records by CSW.  Patient to Follow up at: Follow-up Information    [facility]. Go on 04/17/2016.   Why:  Appt for medication management on this date at 10:00AM.  Contact information: [address] [place], [place] [address] Office: [phone]  Fax: [phone]       [facility] .   Why:  CST has been notified that you are scheduled for discharge today and will call you to resume home visits. Please call the office with any questions or concerns. Thank you! Contact information: [address] [place], [place] [address] Office: [phone]  Fax: [phone]          Next level of care provider has access to [facility] Link:no  Safety Planning and Suicide Prevention discussed: Yes,  SPE completed with pt's son. [facility] pamphlet and Mobile Crisis information provided to pt and she was encouraged to share this information with her support network.   Have you used any form of tobacco in the last 30 days? (Cigarettes, Smokeless Tobacco, Cigars, and/or Pipes): Yes  Has patient been referred to the Quitline?: Patient refused referral  Patient has been referred for addiction treatment: Yes  Smart, Chasitty Hehl LCSW 04/04/2016, 10:50 AM
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
## 2016-04-04 NOTE — Progress Notes (Signed)
Patient discharged per physician order; patient denies SI/HI and A/V hallucinations; patient received prescriptions, AVS, transition record, suicide risk assessment note, and transition record given to the patient after it was reviewed; patient had no other questions or concerns at this time; patient verbalized and signed that all belongings were returned; patient left the unit ambulatory

## 2016-04-04 NOTE — [facility] Suicide Risk Assessment (Signed)
[facility] Discharge Suicide Risk Assessment   Principal Problem: Bipolar disorder, curr episode mixed, severe, w/o psychotic features [facility]) Discharge Diagnoses:  Patient Active Problem List   Diagnosis Date Noted  . Bipolar disorder, curr episode mixed, severe, w/o psychotic features (HCC) [F31.63] 04/03/2016  . Alcohol use disorder, severe, dependence (HCC) [F10.20] 03/30/2016  . Alcohol abuse [F10.10] 01/09/2016  . Alcohol-induced mood disorder (HCC) [F10.94] 01/09/2016  . Diverticulitis [K57.92] 08/15/2015  . Diverticulitis of large intestine with abscess  [K57.20] 11/23/2014  . Acute diverticulitis [K57.92] 11/23/2014  . Leukocytosis [D72.829] 11/17/2014  . Essential hypertension [I10] 11/17/2014  . Heart murmur [R01.1]   . Chronic bronchitis (HCC) [J42]   . Hypothyroidism [E03.9]   . Anxiety [F41.9]   . Depression [F32.9]   . Mental disorder [F99]   . GERD (gastroesophageal reflux disease) [K21.9]   . Simple chronic bronchitis (HCC) [J41.0]     Total Time spent with patient: 30 minutes  Musculoskeletal: Strength & Muscle Tone: within normal limits Gait & Station: normal Patient leans: N/A  Psychiatric Specialty Exam: Review of Systems  Psychiatric/Behavioral: Positive for substance abuse. Negative for depression and suicidal ideas.  All other systems reviewed and are negative.   Blood pressure 112/64, pulse 71, temperature 98.3 F (36.8 C), temperature source Oral, resp. rate 18, height  (1.702 m), weight 97.1 kg (214 lb), SpO2 100 %.Body mass index is 33.52 kg/m.  General Appearance: Casual  Eye Contact::  Fair  Speech:  Clear and Coherent409  Volume:  Normal  Mood:  Euthymic  Affect:  Appropriate  Thought Process:  Goal Directed and Descriptions of Associations: Intact  Orientation:  Full (Time, Place, and Person)  Thought Content:  Logical  Suicidal Thoughts:  No  Homicidal Thoughts:  No  Memory:  Immediate;   Fair Recent;   Fair Remote;   Fair   Judgement:  Fair  Insight:  Fair  Psychomotor Activity:  Normal  Concentration:  Fair  Recall:  [employer] of Knowledge:Fair  Language: Fair  Akathisia:  No  Handed:  Right  AIMS (if indicated):     Assets:  Social Support  Sleep:  Number of Hours: 5.75  Cognition: WNL  ADL's:  Intact   Mental Status Per Nursing Assessment::   On Admission:     Demographic Factors:  NA  Loss Factors: Loss of significant relationship  Historical Factors: Impulsivity  Risk Reduction Factors:   Positive social support  Continued Clinical Symptoms:  Alcohol/Substance Abuse/Dependencies  Cognitive Features That Contribute To Risk:  None    Suicide Risk:  Minimal: No identifiable suicidal ideation.  Patients presenting with no risk factors but with morbid ruminations; may be classified as minimal risk based on the severity of the depressive symptoms  Follow-up Information    [facility]-Medication Management. Go on 04/17/2016.   Why:  Appt for medication management on this date at 10:00AM.  Contact information: [address] [place], [place] [address] Office: [phone]  Fax: [phone]       [facility]-Therapy .   Why:  [facility]ST has been notified that you are scheduled for discharge today and will call you to resume home visits. Please call the office with any questions or concerns. Thank you! Contact information: [address] [place], [place] [address] Office: [phone]  Fax: [phone]          Plan Of Care/Follow-up recommendations:  Activity:  No restrictions Diet:  regular Tests:  as  needed Other:  follow up with after care  Shery Wauneka, MD 04/04/2016, 9:35 AM

## 2016-04-04 NOTE — Progress Notes (Signed)
Adult Psychoeducational Group Note  Date:  04/04/2016 Time:  0845  Group Topic/Focus:  Orientation:   The focus of this group is to educate the patient on the purpose and policies of crisis stabilization and provide a format to answer questions about their admission.  The group details unit policies and expectations of patients while admitted.   Participation Level:  Active  Participation Quality:  Appropriate  Affect:  Appropriate  Cognitive:  Appropriate  Insight: Appropriate  Engagement in Group:  Engaged  Modes of Intervention:  Education  Additional Comments:   Dwayne Begay L 04/04/2016, 9:24 AM
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
## 2016-04-04 NOTE — [facility] Suicide Risk Assessment (Signed)
[facility] INPATIENT:  Family/Significant Other Suicide Prevention Education  Suicide Prevention Education:  Education Completed; [name] (pt's son) [phone] has been identified by the patient as the family member/significant other with whom the patient will be residing, and identified as the person(s) who will aid the patient in the event of a mental health crisis (suicidal ideations/suicide attempt).  With written consent from the patient, the family member/significant other has been provided the following suicide prevention education, prior to the and/or following the discharge of the patient.  The suicide prevention education provided includes the following:  Suicide risk factors  Suicide prevention and interventions  National Suicide Hotline telephone number  [facility] assessment telephone number  [facility] Emergency Assistance 911  [facility] and/or Residential Mobile Crisis Unit telephone number  Request made of family/significant other to:  Remove weapons (e.g., guns, rifles, knives), all items previously/currently identified as safety concern.    Remove drugs/medications (over-the-counter, prescriptions, illicit drugs), all items previously/currently identified as a safety concern.  The family member/significant other verbalizes understanding of the suicide prevention education information provided.  The family member/significant other agrees to remove the items of safety concern listed above.  Smart, Brooke Yang LCSW 04/04/2016, 9:30 AM

## 2016-04-05 LAB — HEMOGLOBIN A1C
Hgb A1c MFr Bld: 5.5 % (ref 4.8–5.6)
Mean Plasma Glucose: 111 mg/dL

## 2016-04-11 ENCOUNTER — Encounter (HOSPITAL_COMMUNITY): Payer: Self-pay | Admitting: Dentistry

## 2016-04-11 ENCOUNTER — Ambulatory Visit (HOSPITAL_COMMUNITY): Payer: Medicaid - Dental | Admitting: Dentistry

## 2016-04-11 ENCOUNTER — Encounter (INDEPENDENT_AMBULATORY_CARE_PROVIDER_SITE_OTHER): Payer: Self-pay

## 2016-04-11 VITALS — BP 134/67 | HR 57 | Temp 98.2°F

## 2016-04-11 DIAGNOSIS — K08109 Complete loss of teeth, unspecified cause, unspecified class: Secondary | ICD-10-CM

## 2016-04-11 DIAGNOSIS — K0820 Unspecified atrophy of edentulous alveolar ridge: Secondary | ICD-10-CM

## 2016-04-11 DIAGNOSIS — Z972 Presence of dental prosthetic device (complete) (partial): Secondary | ICD-10-CM

## 2016-04-11 DIAGNOSIS — Z463 Encounter for fitting and adjustment of dental prosthetic device: Secondary | ICD-10-CM

## 2016-04-11 NOTE — Progress Notes (Signed)
04/11/2016  Patient Name:   Michail JewelsSandra L Hyden Date of Birth:   03/22/1961 Medical Record Number: [id number]  BP 134/67   Pulse (!) 57   Temp 98.2 F (36.8 C) (Oral)   Michail JewelsSandra L Henriquez presents for evaluation of recently inserted upper and lower complete dentures.   SUBJECTIVE: Patient is not complaining of denture irritation. Patient is still having difficulty eating with the dentures.  OBJECTIVE: There is no sign of denture irritation or erythema. Patient has normal saliva.  Procedure: Pressure indicating paste was applied to the dentures. Denture adjustments were made as needed. Fick pressure indicating paste was applied to the lower left and lower right buccal extensions. Adjustments were made as needed.  Interocclusal space between the upper left tuberosity and lower left retromolar pad area was evaluated and adjusted as needed. Dentures were polished. Occlusion evaluated and adjustments made as needed for Centric Relation and protrusive strokes. Patient with tendency to protrude lower jaw. Patient was instructed on finding the maximum intercuspation position without problems.  Patient accepts results. Patient to keep dentures out if sore spots develop. Use salt water rinses as needed to aid healing. Return to clinic as scheduled for denture adjustment.   Call if problems arise before then.  Charlynne Panderonald F. Symiah Nowotny, DDS

## 2016-04-11 NOTE — Patient Instructions (Signed)
Patient to keep dentures out if sore spots develop. Use salt water rinses as needed to aid healing. Return to clinic as scheduled for denture adjustment.   Call if problems arise before then.  Ronald F. Kulinski, DDS  

## 2016-06-05 ENCOUNTER — Encounter (HOSPITAL_COMMUNITY): Payer: Self-pay

## 2016-06-05 ENCOUNTER — Emergency Department (HOSPITAL_COMMUNITY)
Admission: EM | Admit: 2016-06-05 | Discharge: 2016-06-05 | Disposition: A | Discharge status: Home / Self Care | Payer: Medicaid Other | Attending: Emergency Medicine | Admitting: Emergency Medicine

## 2016-06-05 DIAGNOSIS — R51 Headache: Secondary | ICD-10-CM | POA: Insufficient documentation

## 2016-06-05 DIAGNOSIS — Z79899 Other long term (current) drug therapy: Secondary | ICD-10-CM | POA: Insufficient documentation

## 2016-06-05 DIAGNOSIS — R519 Headache, unspecified: Secondary | ICD-10-CM

## 2016-06-05 DIAGNOSIS — F17210 Nicotine dependence, cigarettes, uncomplicated: Secondary | ICD-10-CM | POA: Diagnosis not present

## 2016-06-05 DIAGNOSIS — E039 Hypothyroidism, unspecified: Secondary | ICD-10-CM | POA: Diagnosis not present

## 2016-06-05 DIAGNOSIS — I10 Essential (primary) hypertension: Secondary | ICD-10-CM | POA: Diagnosis not present

## 2016-06-05 MED ORDER — LORAZEPAM 1 MG PO TABS
1.0000 mg | ORAL_TABLET | Freq: Once | ORAL | Status: AC
  Administered 2016-06-05: 1 mg via ORAL
  Filled 2016-06-05: qty 1

## 2016-06-05 NOTE — ED Triage Notes (Signed)
[facility]- pt here from home, started having anxiety. Pt reports BP monitor would not work at home. EMS BP was 160/79. Pt is compliant with BP meds. Pt denies CP/SOB. Pt does have some dry mouth, slight headache, and tingling in the fingers.

## 2016-06-05 NOTE — ED Notes (Signed)
Pt ambulated to restroom. 

## 2016-06-05 NOTE — ED Provider Notes (Signed)
[facility] Provider Note  CSN: [id number] Arrival Date & Time: 06/05/16 @ 1213  History    Chief Complaint Chief Complaint  Patient presents with  . Hypertension    HPI [name] is a 55 y.o. female. Patient endorses "head pressure, but its not a Headache." "It feels like when I have sinus problems." Endorses symptoms in head not associated w/ SOB, CP, abdominal pain, back pain, vision changes, or focal sensory loss or weakness. When patient attempted to check blood pressure when symptoms started at 9 AM it was not working therefore called EMS who reported it 160/79. Denies near syncope or syncope events.  Patient is compliant w/ atenolol 50 mg q day and amlodipine 10 mg q day. Patient endorses this is similar to episodes of "anxiety attacks I have had in the past".   No trauma or illicit drug use. No recent pregnancy or hypercoagulable states.  Patient endorses BP elevation started on Friday when she measured it and found to to be 171/101.  Past Medical & Surgical History    Past Medical History:  Diagnosis Date  . Anemia   . Anxiety   . Arthritis   . Bursitis    hx of  . Chronic bronchitis (HCC)   . Complication of anesthesia    difficult to wake up and blood pressure drops  . Depression   . Diverticula of intestine   . Dysrhythmia    no medication treatment  . Eczema   . GERD (gastroesophageal reflux disease)   . Headache(784.0)   . Heart murmur   . Hypertension    sees Dr. Shana ChuteSpruill, is patients primary  . Hypothyroidism    takes synthroid  . Mental disorder    bipolar  . Pneumonia    hx   Patient Active Problem List   Diagnosis Date Noted  . Bipolar disorder, curr episode mixed, severe, w/o psychotic features (HCC) 04/03/2016  . Alcohol use disorder, severe, dependence (HCC) 03/30/2016  . Alcohol abuse 01/09/2016  . 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 Diverticulitis 08/15/2015  . Diverticulitis of large intestine with abscess   11/23/2014  . Acute diverticulitis 11/23/2014  . Leukocytosis 11/17/2014  . Essential hypertension 11/17/2014  . Heart murmur   . Chronic bronchitis (HCC)   . Hypothyroidism   . Anxiety   . Depression   . Mental disorder   . GERD (gastroesophageal reflux disease)   . Simple chronic bronchitis (HCC)    Past Surgical History:  Procedure Laterality Date  . ABDOMINAL HYSTERECTOMY     has both of ovaries  . CESAREAN SECTION    . COLONOSCOPY    . LUMBAR LAMINECTOMY  08/22/2011   Procedure: MICRODISCECTOMY LUMBAR LAMINECTOMY;  Surgeon: Dorian HeckleJoseph D Stern, MD;  Location: [facility];  Service: Neurosurgery;  Laterality: Right;  RIGHT Lumbar Five-Sacral One microdiskectomy  . MOUTH SURGERY    . MULTIPLE EXTRACTIONS WITH ALVEOLOPLASTY N/A 08/20/2015   Procedure: Extraction of tooth #'s 1-12,14,20-27, 29 with alveoloplasty;  Surgeon: Charlynne Panderonald F Kulinski, DDS;  [facility];  Service: Oral Surgery;  Laterality: N/A;  . TUBAL LIGATION      Family & Social History    Family History  Problem Relation Age of Onset  . Anesthesia problems Mother   . Cancer Mother     thyroid  . Hypertension Mother   . Anesthesia problems Sister   . Hypertension Sister   . Cancer Maternal Aunt   . Cancer Maternal Aunt   . Colon  cancer Sister   . Stroke Sister   . Hypertension Brother    Social History  Substance Use Topics  . Smoking status: Current Every Day Smoker    Packs/day: 0.25    Years: 20.00    Types: Cigarettes  . Smokeless tobacco: Never Used     Comment: 4-6 cigs/day  . Alcohol use 0.0 oz/week     Comment: occ    Home Medications    Prior to Admission medications   Medication Sig Start Date End Date Taking? Authorizing Provider  albuterol (PROVENTIL HFA;VENTOLIN HFA) 108 (90 Base) MCG/ACT inhaler Inhale 2 puffs into the lungs every 4 (four) hours as needed for wheezing or shortness of breath. 04/04/16   Sanjuana Kava, NP  amLODipine (NORVASC) 10 MG tablet Take 1 tablet (10 mg total)  by mouth daily. Take this instead of your LOTREL until you see your regular doctor: For high blood pressure 04/04/16   Sanjuana Kava, NP  atenolol (TENORMIN) 50 MG tablet Take 1 tablet (50 mg total) by mouth daily. Need Dr appt for future refills: For high blood pressure 04/04/16   Sanjuana Kava, NP  hydrOXYzine (ATARAX/VISTARIL) 25 MG tablet Take 1 tablet (25 mg total) by mouth every 6 (six) hours as needed for anxiety. 04/04/16   Sanjuana Kava, NP  lamoTRIgine (LAMICTAL) 25 MG tablet Take 1 tablet (25 mg total) by mouth daily. For mood stabilization 04/04/16   Sanjuana Kava, NP  levothyroxine (SYNTHROID, LEVOTHROID) 125 MCG tablet Take 1 tablet (125 mcg total) by mouth daily. For hypothyroidism 04/04/16   Sanjuana Kava, NP  polycarbophil (FIBERCON) 625 MG tablet Take 1 tablet (625 mg total) by mouth daily as needed (for regularity). Constipation 04/04/16   Sanjuana Kava, NP  polyethylene glycol (MIRALAX / GLYCOLAX) packet Take 17 g by mouth daily as needed for mild constipation. 04/04/16   Sanjuana Kava, NP  QUEtiapine (SEROQUEL XR) 50 MG TB24 24 hr tablet Take 2 tablets (100 mg total) by mouth at bedtime. For mood control 04/04/16   Sanjuana Kava, NP    Allergies    Benazepril; Penicillins; Sulfa antibiotics; Ciprofloxacin hcl; Clindamycin/lincomycin; Erythromycin; Other; Zithromax [azithromycin]; and Sulfamethoxazole  I reviewed & agree with nursing's documentation on the patient's past medical, surgical, social & family histories as well as their allergies.  Review of Systems  Complete ROS obtained, and is negative except as stated in HPI.  Physical Exam  Updated Vital Signs BP 157/86   Pulse 70   Temp 98.4 F (36.9 C) (Oral)   Resp 17   Ht 5\' 7"  (1.702 m)   Wt 101.2 kg   SpO2 98%   BMI 34.93 kg/m  I have reviewed the triage vital signs and the nursing notes. Physical Exam CONST: Patient alert, well appearing, in no apparent distress, anxious.  EYES: PERRLA. EOMI. Conjunctiva w/o d/c. Lids  AT w/o swelling.  ENMT: External Nares & Ears AT w/o swelling. Oropharynx patent. MM moist.  NECK: ROM full w/o rigidity. Trachea midline. JVD absent.  CVS: +S1/S2 w/o obvious murmur. Lower extremities w/o pitting edema.  RESP: Respiratory effort unlabored w/o retractions or accessory muscle use. BS clear bilaterally.  GI: Soft & ND. +BS x 4. TTP absent. Hernia absent. Guarding & Rebound absent.  BACK: CVA TTP absent bilaterally.  SKIN: Skin warm & dry. Turgor good. No rash.  PSYCH: Alert. Oriented. Affect and mood appropriate.  NEURO: CN II-XII grossly intact. Motor exam symmetric w/ upper &  lower extremities 5/5 bilaterally. Sensation grossly intact.  MSK: Joints located & stable, w/o obvious dislocation & obvious deformity or crepitus absent w/ Cap refill < 2 sec. Peripheral pulses 2+ & equal in all extremities.   ED Treatments & Results   Labs (only abnormal results are displayed) Labs Reviewed - No data to display  EKG    EKG Interpretation  Date/Time:    Ventricular Rate:    PR Interval:    QRS Duration:   QT Interval:    QTC Calculation:   R Axis:     Text Interpretation:         Radiology No results found.  Pertinent labs & imaging results that were available during my care of the patient were independently visualized by me and considered in my medical decision making, please see chart for details.  Procedures (including critical care time) Procedures  Medications Ordered in ED Medications  LORazepam (ATIVAN) tablet 1 mg (1 mg Oral Given 06/05/16 1302)    Initial Impression & Plan / ED Course & Results / Final Disposition   Initial Impression & Plan Patient presents w/ concern for elevated BP this past past weekend due to endorsements of "head pressure" w/o CP or SOB. No vision changes.  Vitals stable BP upon placement in room 157/86. No tachycardia. Normotensive. Patient afebrile.  Patient appears well & w/o concern for toxicity. In NAD. They are alert  w/o AMS. No focal Neurologic Deficits. Extremities well perfused, no concern for Shock. No Respiratory Distress. Lungs CTAB. Maintaining saturations w/o new Oxygen requirement. No Peritonitis. Abdomen benign. The remainder of their examination was unremarkable.   ED Course & Results As a patient is well-appearing and has no concerning symptomatic endorsements do not believe laboratory work or imaging necessitated at this time. EKG obtained and reviewed which shows normal sinus rhythm without changes of acute ST segment elevation or depression. Patient has no T-wave inversions or anatomical changes that were concerning for ischemia. Patient given 1 mg by mouth Ativan due to notable anxiety noted in room and markedly improved afterwards. Patient requesting discharge at this time as she states she is happy with blood pressure and states that symptoms are no longer present. Headache never described as sudden in onset and was not maximal at onset and is similar to previous episodes therefore do not have concern for subarachnoid or intraventricular hemorrhage. Patient also has no findings on exam that would concern for such.  Given patient has no previous history of hypercoagulable disorder and has not been pregnant recently do not have concern for cerebral venous sinus thrombosis.  Final Disposition Reassessment of the patient reveals they are resting comfortably in room. Patient has close follow-up with primary care physician and states she will do so in the next 2-3 days.  ED Course in its entirety, care plan & clinical impressions w/ associated risks were reviewed w/ the patient. In light of the patient's reassuring evaluation above, I consider discharge disposition reasonable. They are in agreement. I gave my typical, strict return precautions in simple, non-technical language. We also discussed symptoms that are most concerning & would necessitate emergent return. I explicitly told them to immediately  return to the ED if new symptoms develop, if worse, or for ANY concern. Treatments & follow up plan agreed upon & I confirmed all concerns & questions were addressed prior to discharge.  Follow Up Donia Guiles, MD [address] [place] [address] [phone]  Schedule an appointment as soon as possible for  a visit in 2 days For symptomatic reassessment and reassessment of the Blood Pressure  [facility] EMERGENCY DEPARTMENT [address] [id number] mc [place] [address] [phone] Go to  FOR ANY CONCERNS OR IF WORSE   New Prescriptions New Prescriptions   No medications on file     Final Clinical Impression & ED Diagnoses   1. Hypertension, unspecified type   2. Nonintractable headache, unspecified chronicity pattern, unspecified headache type    Patient care discussed with the attending physician, Dr. Erma Heritage, who oversaw their evaluation & treatment & voiced agreement.  Note: This document was prepared using Dragon voice recognition software and may include unintentional dictation errors.  House Officer: Jonette Eva, MD, Emergency Medicine Resident.   Jonette Eva, MD 06/05/16 1324    Shaune Pollack, MD 06/06/16 1001

## 2016-06-05 NOTE — ED Notes (Signed)
Pt states "I can't get on the bus with this medication in me, I usually take SCAT"

## 2016-06-05 NOTE — ED Notes (Signed)
Pt ambulated to restroom from hallway bed, tolerated well. 

## 2016-06-25 ENCOUNTER — Emergency Department (HOSPITAL_COMMUNITY)
Admission: EM | Admit: 2016-06-25 | Discharge: 2016-06-25 | Disposition: A | Discharge status: Home / Self Care | Payer: Medicaid Other | Attending: Emergency Medicine | Admitting: Emergency Medicine

## 2016-06-25 ENCOUNTER — Encounter (HOSPITAL_COMMUNITY): Payer: Self-pay

## 2016-06-25 DIAGNOSIS — Z79899 Other long term (current) drug therapy: Secondary | ICD-10-CM | POA: Insufficient documentation

## 2016-06-25 DIAGNOSIS — I10 Essential (primary) hypertension: Secondary | ICD-10-CM | POA: Insufficient documentation

## 2016-06-25 DIAGNOSIS — E039 Hypothyroidism, unspecified: Secondary | ICD-10-CM | POA: Diagnosis not present

## 2016-06-25 DIAGNOSIS — F17210 Nicotine dependence, cigarettes, uncomplicated: Secondary | ICD-10-CM | POA: Insufficient documentation

## 2016-06-25 DIAGNOSIS — F410 Panic disorder [episodic paroxysmal anxiety] without agoraphobia: Secondary | ICD-10-CM

## 2016-06-25 DIAGNOSIS — F419 Anxiety disorder, unspecified: Secondary | ICD-10-CM | POA: Diagnosis not present

## 2016-06-25 MED ORDER — CLONAZEPAM 0.5 MG PO TABS: 0.5000 mg | ORAL_TABLET | Freq: Two times a day (BID) | ORAL | 0 refills | Status: DC | PRN

## 2016-06-25 NOTE — ED Provider Notes (Signed)
[facility] Provider Note   CSN: [id number] Arrival date & time: 06/25/16  1300     History   Chief Complaint Chief Complaint  Patient presents with  . Anxiety  . Hypertension    HPI [name] is a 55 y.o. female.  HPI Patient reports that she's been having problems with anxiety and panic attacks for the past month. She reports is because she had a change in her psychiatric medications. She reports all this really seemed to get worse when she was put on Lamictal. She reports her doctor stopped him excellent colored only takes Seroquel. She reports that she still is feeling anxious and getting feelings of doom. She starts to feel like a pressure is just coming down over her head and her chest. She reports she starts to get shaky and jittery. She has continued to take her Seroquel as prescribed but is still dealing with these symptoms. She does not report being homicidal or suicidal. She reports she thinks her blood pressure goes up because this anxiety comes on and then she gets very scared. Past Medical History:  Diagnosis Date  . Anemia   . Anxiety   . Arthritis   . Bursitis    hx of  . Chronic bronchitis (HCC)   . Complication of anesthesia    difficult to wake up and blood pressure drops  . Depression   . Diverticula of intestine   . Dysrhythmia    no medication treatment  . Eczema   . GERD (gastroesophageal reflux disease)   . Headache(784.0)   . Heart murmur   . Hypertension    sees Dr. Shana Chute, is patients primary  . Hypothyroidism    takes synthroid  . Mental disorder    bipolar  . Pneumonia    hx    Patient Active Problem List   Diagnosis Date Noted  . Bipolar disorder, curr episode mixed, severe, w/o psychotic features (HCC) 04/03/2016  . Alcohol use disorder, severe, dependence (HCC) 03/30/2016  . Alcohol abuse 01/09/2016  . 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 Diverticulitis 08/15/2015  . Diverticulitis of large intestine  with abscess  11/23/2014  . Acute diverticulitis 11/23/2014  . Leukocytosis 11/17/2014  . Essential hypertension 11/17/2014  . Heart murmur   . Chronic bronchitis (HCC)   . Hypothyroidism   . Anxiety   . Depression   . Mental disorder   . GERD (gastroesophageal reflux disease)   . Simple chronic bronchitis (HCC)     Past Surgical History:  Procedure Laterality Date  . ABDOMINAL HYSTERECTOMY     has both of ovaries  . CESAREAN SECTION    . COLONOSCOPY    . LUMBAR LAMINECTOMY  08/22/2011   Procedure: MICRODISCECTOMY LUMBAR LAMINECTOMY;  Surgeon: Dorian Heckle, MD;  Location: [facility];  Service: Neurosurgery;  Laterality: Right;  RIGHT Lumbar Five-Sacral One microdiskectomy  . MOUTH SURGERY    . MULTIPLE EXTRACTIONS WITH ALVEOLOPLASTY N/A 08/20/2015   Procedure: Extraction of tooth #'s 1-12,14,20-27, 29 with alveoloplasty;  Surgeon: Charlynne Pander, DDS;  Location: [facility];  Service: Oral Surgery;  Laterality: N/A;  . TUBAL LIGATION      OB History    No data available       Home Medications    Prior to Admission medications   Medication Sig Start Date End Date Taking? Authorizing Provider  albuterol (PROVENTIL HFA;VENTOLIN HFA) 108 (90 Base) MCG/ACT inhaler Inhale 2 puffs into the lungs every 4 (four) hours as  needed for wheezing or shortness of breath. 04/04/16  Yes Sanjuana Kava, NP  amLODipine (NORVASC) 10 MG tablet Take 1 tablet (10 mg total) by mouth daily. Take this instead of your LOTREL until you see your regular doctor: For high blood pressure 04/04/16  Yes Sanjuana Kava, NP  atenolol (TENORMIN) 50 MG tablet Take 1 tablet (50 mg total) by mouth daily. Need Dr appt for future refills: For high blood pressure 04/04/16  Yes Sanjuana Kava, NP  levothyroxine (SYNTHROID, LEVOTHROID) 125 MCG tablet Take 1 tablet (125 mcg total) by mouth daily. For hypothyroidism 04/04/16  Yes Sanjuana Kava, NP  polycarbophil (FIBERCON) 625 MG tablet Take 1 tablet (625 mg total) by mouth daily  as needed (for regularity). Constipation 04/04/16  Yes Sanjuana Kava, NP  polyethylene glycol (MIRALAX / GLYCOLAX) packet Take 17 g by mouth daily as needed for mild constipation. 04/04/16  Yes Sanjuana Kava, NP  QUEtiapine (SEROQUEL XR) 50 MG TB24 24 hr tablet Take 2 tablets (100 mg total) by mouth at bedtime. For mood control Patient taking differently: Take 100 mg by mouth 2 (two) times daily. For mood control 04/04/16  Yes Sanjuana Kava, NP  clonazePAM (KLONOPIN) 0.5 MG tablet Take 1 tablet (0.5 mg total) by mouth 2 (two) times daily as needed for anxiety. 06/25/16   Arby Barrette, MD  hydrOXYzine (ATARAX/VISTARIL) 25 MG tablet Take 1 tablet (25 mg total) by mouth every 6 (six) hours as needed for anxiety. Patient not taking: Reported on 06/25/2016 04/04/16   Sanjuana Kava, NP  lamoTRIgine (LAMICTAL) 25 MG tablet Take 1 tablet (25 mg total) by mouth daily. For mood stabilization Patient not taking: Reported on 06/25/2016 04/04/16   Sanjuana Kava, NP    Family History Family History  Problem Relation Age of Onset  . Anesthesia problems Mother   . Cancer Mother     thyroid  . Hypertension Mother   . Anesthesia problems Sister   . Hypertension Sister   . Cancer Maternal Aunt   . Cancer Maternal Aunt   . Colon cancer Sister   . Stroke Sister   . Hypertension Brother     Social History Social History  Substance Use Topics  . Smoking status: Current Every Day Smoker    Packs/day: 0.25    Years: 20.00    Types: Cigarettes  . Smokeless tobacco: Never Used     Comment: 4-6 cigs/day  . Alcohol use 0.0 oz/week     Comment: occ     Allergies   Benazepril; Penicillins; Sulfa antibiotics; Ciprofloxacin hcl; Clindamycin/lincomycin; Erythromycin; Other; Zithromax [azithromycin]; and Sulfamethoxazole   Review of Systems Review of Systems 10 Systems reviewed and are negative for acute change except as noted in the HPI.   Physical Exam Updated Vital Signs BP 143/70   Pulse (!) 59   Temp  98.3 F (36.8 C) (Oral)   Resp 18   SpO2 100%   Physical Exam  Constitutional: She is oriented to person, place, and time. She appears well-developed and well-nourished. No distress.  HENT:  Head: Normocephalic and atraumatic.  Eyes: Conjunctivae are normal.  Neck: Neck supple.  Cardiovascular: Normal rate and regular rhythm.   No murmur heard. Pulmonary/Chest: Effort normal and breath sounds normal. No respiratory distress.  Abdominal: Soft. There is no tenderness.  Musculoskeletal: She exhibits no edema.  Neurological: She is alert and oriented to person, place, and time. No cranial nerve deficit. She exhibits normal muscle tone. Coordination  normal.  Patient is slightly tremulous but appropriate. She is oriented and follows all commands without difficulty.  Skin: Skin is warm and dry.  Psychiatric: She has a normal mood and affect.  Patient is mildly anxious. She is however appropriate and having normal conversation. She shows no signs of responding to external stimuli.  Nursing note and vitals reviewed.    ED Treatments / Results  Labs (all labs ordered are listed, but only abnormal results are displayed) Labs Reviewed - No data to display  EKG  EKG Interpretation None       Radiology No results found.  Procedures Procedures (including critical care time)  Medications Ordered in ED Medications - No data to display   Initial Impression / Assessment and Plan / ED Course  I have reviewed the triage vital signs and the nursing notes.  Pertinent labs & imaging results that were available during my care of the patient were reviewed by me and considered in my medical decision making (see chart for details).  Clinical Course    Final Clinical Impressions(s) / ED Diagnoses   Final diagnoses:  Anxiety attack  Patient cites increasing anxiety over the past month. She reports this has been in conjunction with recent stressors and medication changes. Her blood  pressure has normalized since in the emergency department. I do not feel she needs any change in management of hypertension. Patient does appear slightly anxious but is not showing any features of psychosis or decompensation. She does have follow-up with her doctor and her psychiatrist. Patient will be given Klonopin to take for the next week until she can be seen by her providers.  New Prescriptions New Prescriptions   CLONAZEPAM (KLONOPIN) 0.5 MG TABLET    Take 1 tablet (0.5 mg total) by mouth 2 (two) times daily as needed for anxiety.     Arby BarretteMarcy Meiko Stranahan, MD 06/25/16 [phone]

## 2016-06-25 NOTE — ED Triage Notes (Signed)
Patient here with BP fluctuating the past week and increased anxiety. States that she is taking her BP meds and bipolar meds as prescribed. States that the past week feels more anxious and unable to sleep, afraid to be alone. Alert and oriented, denies suicidal ideation.

## 2016-07-11 ENCOUNTER — Ambulatory Visit (HOSPITAL_COMMUNITY): Payer: Medicaid - Dental | Admitting: Dentistry

## 2016-07-11 ENCOUNTER — Encounter (HOSPITAL_COMMUNITY): Payer: Self-pay | Admitting: Dentistry

## 2016-07-11 VITALS — BP 133/69 | HR 59 | Temp 98.7°F

## 2016-07-11 DIAGNOSIS — K08109 Complete loss of teeth, unspecified cause, unspecified class: Secondary | ICD-10-CM | POA: Diagnosis not present

## 2016-07-11 DIAGNOSIS — Z972 Presence of dental prosthetic device (complete) (partial): Secondary | ICD-10-CM

## 2016-07-11 DIAGNOSIS — K0820 Unspecified atrophy of edentulous alveolar ridge: Secondary | ICD-10-CM

## 2016-07-11 DIAGNOSIS — Z463 Encounter for fitting and adjustment of dental prosthetic device: Secondary | ICD-10-CM

## 2016-07-11 DIAGNOSIS — R220 Localized swelling, mass and lump, head: Secondary | ICD-10-CM

## 2016-07-11 NOTE — Patient Instructions (Signed)
Plan/recommendations: 1. Keep dentures out if sore spots arise. 2. Use salt water rinses as needed to aid healing. 3. Return to clinic as scheduled or call if problems arise before then.   Charlynne Panderonald F. Kulinski, DDS

## 2016-07-11 NOTE — Progress Notes (Signed)
07/11/2016  Patient Name:   Brooke JewelsSandra L Hester Date of Birth:   12/04/1960 Medical Record Number: [id number]  BP 133/69 (BP Location: Left Arm)   Pulse (!) 59   Temp 98.7 F (37.1 C) (Oral)   Brooke JewelsSandra L Hester is a 55 year old female that presents for periodic oral exam and evaluation of upper and lower complete dentures. Patient was initially seen in December 2016 for evaluation of facial swelling. Patient had full mouth extractions on 08/20/2015 in the operating room. An upper and lower complete denture were then fabricated and inserted on 11/02/2015. She has been seen for multiple denture adjustment appointments since then.  Patient now presents for oral examination and evaluation of upper and lower complete dentures.   Medical Hx Update:  Past Medical History:  Diagnosis Date  . Anemia   . Anxiety   . Arthritis   . Bursitis    hx of  . Chronic bronchitis (HCC)   . Complication of anesthesia    difficult to wake up and blood pressure drops  . Depression   . Diverticula of intestine   . Dysrhythmia    no medication treatment  . Eczema   . GERD (gastroesophageal reflux disease)   . Headache(784.0)   . Heart murmur   . Hypertension    sees Dr. Shana ChuteSpruill, is patients primary  . Hypothyroidism    takes synthroid  . Mental disorder    bipolar  . Pneumonia    hx  .  Past Surgical History:  Procedure Laterality Date  . ABDOMINAL HYSTERECTOMY     has both of ovaries  . CESAREAN SECTION    . COLONOSCOPY    . LUMBAR LAMINECTOMY  08/22/2011   Procedure: MICRODISCECTOMY LUMBAR LAMINECTOMY;  Surgeon: Dorian HeckleJoseph D Stern, MD;  Location: [facility];  Service: Neurosurgery;  Laterality: Right;  RIGHT Lumbar Five-Sacral One microdiskectomy  . MOUTH SURGERY    . MULTIPLE EXTRACTIONS WITH ALVEOLOPLASTY N/A 08/20/2015   Procedure: Extraction of tooth #'s 1-12,14,20-27, 29 with alveoloplasty;  Surgeon: Charlynne Panderonald F Halynn Reitano, DDS;  [facility];  Service: Oral Surgery;  Laterality: N/A;  . TUBAL  LIGATION      ALLERGIES/ADVERSE DRUG REACTIONS: Allergies  Allergen Reactions  . Benazepril Anaphylaxis, Hives and Itching  . Penicillins Hives    Has itching and some swelling in face Has patient had a PCN reaction causing immediate rash, facial/tongue/throat swelling, SOB or lightheadedness with hypotension:YES Has patient had a PCN reaction causing severe rash involving mucus membranes or skin necrosis:unsure Has patient had a PCN reaction that required hospitalization:No Has patient had a PCN reaction occurring within the last 10 years:No If all of the above answers are "NO", then may proceed with Cephalosporin use.   . Sulfa Antibiotics Hives    Itching and facial swelling  . Ciprofloxacin Hcl Hives    "funny sensation in gums"  . Clindamycin/Lincomycin Hives    Itching, welps  . Erythromycin Hives    itching  . Other     Pt has problems with being awaken from anesthesia, Her blood pressure drops very low  . Zithromax [Azithromycin] Hives    itching  . Sulfamethoxazole Rash    MEDICATIONS: Current Outpatient Prescriptions  Medication Sig Dispense Refill  . albuterol (PROVENTIL HFA;VENTOLIN HFA) 108 (90 Base) MCG/ACT inhaler Inhale 2 puffs into the lungs every 4 (four) hours as needed for wheezing or shortness of breath. 1 Inhaler 0  . amLODipine (NORVASC) 10 MG tablet Take 1 tablet (10 mg  total) by mouth daily. Take this instead of your LOTREL until you see your regular doctor: For high blood pressure 1 tablet 0  . atenolol (TENORMIN) 50 MG tablet Take 1 tablet (50 mg total) by mouth daily. Need Dr appt for future refills: For high blood pressure 1 tablet 0  . clonazePAM (KLONOPIN) 0.5 MG tablet Take 1 tablet (0.5 mg total) by mouth 2 (two) times daily as needed for anxiety. 14 tablet 0  . hydrOXYzine (ATARAX/VISTARIL) 25 MG tablet Take 1 tablet (25 mg total) by mouth every 6 (six) hours as needed for anxiety. (Patient not taking: Reported on 06/25/2016) 60 tablet 0  .  lamoTRIgine (LAMICTAL) 25 MG tablet Take 1 tablet (25 mg total) by mouth daily. For mood stabilization (Patient not taking: Reported on 06/25/2016) 30 tablet 0  . levothyroxine (SYNTHROID, LEVOTHROID) 125 MCG tablet Take 1 tablet (125 mcg total) by mouth daily. For hypothyroidism 1 tablet 0  . polycarbophil (FIBERCON) 625 MG tablet Take 1 tablet (625 mg total) by mouth daily as needed (for regularity). Constipation    . polyethylene glycol (MIRALAX / GLYCOLAX) packet Take 17 g by mouth daily as needed for mild constipation. 14 each 0  . QUEtiapine (SEROQUEL XR) 50 MG TB24 24 hr tablet Take 2 tablets (100 mg total) by mouth at bedtime. For mood control (Patient taking differently: Take 100 mg by mouth 2 (two) times daily. For mood control) 60 each 0   No current facility-administered medications for this visit.     C/C: Patient denies having any problems with her dentures.  HPI:  Brooke JewelsSandra L Hester is a 55 year old female that presents for periodic oral exam and evaluation of upper and lower complete dentures. Patient was initially seen in December 2016 for evaluation of facial swelling. Patient had full mouth extractions on 08/20/2015 in the operating room. An upper and lower complete denture were then fabricated and inserted on 11/02/2015. She has been seen for multiple denture adjustment appointments since then.  Patient now presents for oral examination and evaluation of upper and lower complete dentures.   DENTAL EXAM: General: Patient is a well-developed, well-nourished female in no acute distress. Vitals: BP 133/69 (BP Location: Left Arm)   Pulse (!) 59   Temp 98.7 F (37.1 C) (Oral)  Extraoral Exam: There is no neck lymphadenopathy palpated. The patient denies acute TMJ symptoms. Intraoral  Exam: Patient has normal saliva. There is no evidence of denture irritation or erythema. Dentition: Patient is edentulous. There is atrophy of the lower edentulous alveolar ridge. Prosthodontic:  Patient has an upper lower complete dentures that are stable and retentive. Pressure indicating paste was applied to the upper lower dentures. Dentures were adjusted as needed. Thick PI{P applied to lower denture borders. Adjustments made as needed. Dentures were polished. Occlusion:  Occlusion was evaluated and is acceptable in centric relation and protrusive strokes.  Assessments: 1. Patient is edentulous 2. There is atrophy of the edentulous alveolar ridges. 3. Upper and lower complete dentures are stable and retentive.  Plan/recommendations: 1. Keep dentures out if sore spots arise. 2. Use salt water rinses as needed to aid healing. 3. Return to clinic as scheduled or call if problems arise before then.   Charlynne Panderonald F. [facility] [place], DDS

## 2016-07-27 ENCOUNTER — Encounter (HOSPITAL_COMMUNITY): Payer: Self-pay

## 2016-07-27 ENCOUNTER — Emergency Department (HOSPITAL_COMMUNITY)
Admission: EM | Admit: 2016-07-27 | Discharge: 2016-07-27 | Disposition: A | Discharge status: Home / Self Care | Payer: Medicaid Other | Attending: Emergency Medicine | Admitting: Emergency Medicine

## 2016-07-27 DIAGNOSIS — I10 Essential (primary) hypertension: Secondary | ICD-10-CM | POA: Insufficient documentation

## 2016-07-27 DIAGNOSIS — E039 Hypothyroidism, unspecified: Secondary | ICD-10-CM | POA: Diagnosis not present

## 2016-07-27 DIAGNOSIS — J0100 Acute maxillary sinusitis, unspecified: Secondary | ICD-10-CM | POA: Diagnosis not present

## 2016-07-27 DIAGNOSIS — R51 Headache: Secondary | ICD-10-CM | POA: Diagnosis present

## 2016-07-27 DIAGNOSIS — Z79899 Other long term (current) drug therapy: Secondary | ICD-10-CM | POA: Diagnosis not present

## 2016-07-27 DIAGNOSIS — F17210 Nicotine dependence, cigarettes, uncomplicated: Secondary | ICD-10-CM | POA: Insufficient documentation

## 2016-07-27 HISTORY — DX: Bipolar disorder, unspecified: F31.9

## 2016-07-27 MED ORDER — DOXYCYCLINE HYCLATE 100 MG PO CAPS: 100.0000 mg | ORAL_CAPSULE | Freq: Two times a day (BID) | ORAL | 0 refills | Status: DC

## 2016-07-27 MED ORDER — FLUTICASONE PROPIONATE 50 MCG/ACT NA SUSP: 1.0000 | Freq: Every day | NASAL | 0 refills | Status: DC

## 2016-07-27 NOTE — ED Triage Notes (Signed)
PT RECEIVED FROM HOME VIA EMS C/O FACIAL PAIN DUE TO HER SINUSES. PT STS SHE HAS A BURNING SENSATION TO HER FACE WITH NASAL CONGESTION EVERY MORNING X1 MONTH. PT STS [facility] A[facility], BUT SHE WAS GIVEN A PRESCRIPTION FOR HER BIPOLAR DISORDER. DENIES FEVER.

## 2016-07-27 NOTE — Discharge Instructions (Signed)
Please read and follow all provided instructions.  Your diagnoses today include:  1. Acute non-recurrent maxillary sinusitis    Tests performed today include: Vital signs. See below for your results today.   Medications prescribed:  Take as prescribed   Home care instructions:  Follow any educational materials contained in this packet.  Follow-up instructions: Please follow-up with your primary care provider for further evaluation of symptoms and treatment   Return instructions:  Please return to the Emergency Department if you do not get better, if you get worse, or new symptoms OR  - Fever (temperature greater than 101.83F)  - Bleeding that does not stop with holding pressure to the area    -Severe pain (please note that you may be more sore the day after your accident)  - Chest Pain  - Difficulty breathing  - Severe nausea or vomiting  - Inability to tolerate food and liquids  - Passing out  - Skin becoming red around your wounds  - Change in mental status (confusion or lethargy)  - New numbness or weakness    Please return if you have any other emergent concerns.  Additional Information:  Your vital signs today were: BP 143/77 (BP Location: Left Arm)    Pulse 60    Temp 98.8 F (37.1 C) (Oral)    Resp 15    Ht 5\' 7"  (1.702 m)    Wt 97.1 kg    SpO2 100%    BMI 33.52 kg/m  If your blood pressure (BP) was elevated above 135/85 this visit, please have this repeated by your doctor within one month. ---------------

## 2016-07-27 NOTE — ED Provider Notes (Signed)
[facility]-EMERGENCY DEPT Provider Note   CSN: [id number] Arrival date & time: 07/27/16  1534  By signing my name below, I, [name], attest that this documentation has been prepared under the direction and in the presence of Audry Pili, PA-C.  Electronically Signed: Octavia Heir, ED Scribe. 07/27/16. 4:15 PM.    History   Chief Complaint Chief Complaint  Patient presents with  . Facial Pain   The history is provided by the patient. No language interpreter was used.   HPI Comments: [name] is a 55 y.o. female brought in by ambulance, who has a PMhx of bipolar 1, GERD, hypothyroidism, and HTN presents to the Emergency Department complaining of intermittent, moderate, sinus pain x 1 month. Pt describes her pain as burning and stinging. She notes associated cough, congestion, ear pressure, post-nasal drip for the past month also. Pt has been using her inhaler to try and relieve her symptoms but has not had any relief. She denies fever.  Past Medical History:  Diagnosis Date  . Anemia   . Anxiety   . Arthritis   . Bipolar 1 disorder (HCC)   . Bursitis    hx of  . Chronic bronchitis (HCC)   . Complication of anesthesia    difficult to wake up and blood pressure drops  . Depression   . Diverticula of intestine   . Dysrhythmia    no medication treatment  . Eczema   . GERD (gastroesophageal reflux disease)   . Headache(784.0)   . Heart murmur   . Hypertension    sees Dr. Shana Chute, is patients primary  . Hypothyroidism    takes synthroid  . Mental disorder    bipolar  . Pneumonia    hx    Patient Active Problem List   Diagnosis Date Noted  . Bipolar disorder, curr episode mixed, severe, w/o psychotic features (HCC) 04/03/2016  . Alcohol use disorder, severe, dependence (HCC) 03/30/2016  . Alcohol abuse 01/09/2016  . 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 Diverticulitis 08/15/2015  . Diverticulitis of large intestine with abscess  11/23/2014  .  Acute diverticulitis 11/23/2014  . Leukocytosis 11/17/2014  . Essential hypertension 11/17/2014  . Heart murmur   . Chronic bronchitis (HCC)   . Hypothyroidism   . Anxiety   . Depression   . Mental disorder   . GERD (gastroesophageal reflux disease)   . Simple chronic bronchitis (HCC)     Past Surgical History:  Procedure Laterality Date  . ABDOMINAL HYSTERECTOMY     has both of ovaries  . CESAREAN SECTION    . COLONOSCOPY    . LUMBAR LAMINECTOMY  08/22/2011   Procedure: MICRODISCECTOMY LUMBAR LAMINECTOMY;  Surgeon: Dorian Heckle, MD;  Location: [facility];  Service: Neurosurgery;  Laterality: Right;  RIGHT Lumbar Five-Sacral One microdiskectomy  . MOUTH SURGERY    . MULTIPLE EXTRACTIONS WITH ALVEOLOPLASTY N/A 08/20/2015   Procedure: Extraction of tooth #'s 1-12,14,20-27, 29 with alveoloplasty;  Surgeon: Charlynne Pander, DDS;  Location: [facility];  Service: Oral Surgery;  Laterality: N/A;  . TUBAL LIGATION      OB History    No data available       Home Medications    Prior to Admission medications   Medication Sig Start Date End Date Taking? Authorizing Provider  albuterol (PROVENTIL HFA;VENTOLIN HFA) 108 (90 Base) MCG/ACT inhaler Inhale 2 puffs into the lungs every 4 (four) hours as needed for wheezing or shortness of breath. 04/04/16   Nicole Kindred  I Nwoko, NP  amLODipine (NORVASC) 10 MG tablet Take 1 tablet (10 mg total) by mouth daily. Take this instead of your LOTREL until you see your regular doctor: For high blood pressure 04/04/16   Sanjuana KavaAgnes I Nwoko, NP  atenolol (TENORMIN) 50 MG tablet Take 1 tablet (50 mg total) by mouth daily. Need Dr appt for future refills: For high blood pressure 04/04/16   Sanjuana KavaAgnes I Nwoko, NP  clonazePAM (KLONOPIN) 0.5 MG tablet Take 1 tablet (0.5 mg total) by mouth 2 (two) times daily as needed for anxiety. 06/25/16   Arby BarretteMarcy Pfeiffer, MD  hydrOXYzine (ATARAX/VISTARIL) 25 MG tablet Take 1 tablet (25 mg total) by mouth every 6 (six) hours as needed for  anxiety. Patient not taking: Reported on 06/25/2016 04/04/16   Sanjuana KavaAgnes I Nwoko, NP  lamoTRIgine (LAMICTAL) 25 MG tablet Take 1 tablet (25 mg total) by mouth daily. For mood stabilization Patient not taking: Reported on 06/25/2016 04/04/16   Sanjuana KavaAgnes I Nwoko, NP  levothyroxine (SYNTHROID, LEVOTHROID) 125 MCG tablet Take 1 tablet (125 mcg total) by mouth daily. For hypothyroidism 04/04/16   Sanjuana KavaAgnes I Nwoko, NP  polycarbophil (FIBERCON) 625 MG tablet Take 1 tablet (625 mg total) by mouth daily as needed (for regularity). Constipation 04/04/16   Sanjuana KavaAgnes I Nwoko, NP  polyethylene glycol (MIRALAX / GLYCOLAX) packet Take 17 g by mouth daily as needed for mild constipation. 04/04/16   Sanjuana KavaAgnes I Nwoko, NP  QUEtiapine (SEROQUEL XR) 50 MG TB24 24 hr tablet Take 2 tablets (100 mg total) by mouth at bedtime. For mood control Patient taking differently: Take 100 mg by mouth 2 (two) times daily. For mood control 04/04/16   Sanjuana KavaAgnes I Nwoko, NP    Family History Family History  Problem Relation Age of Onset  . Anesthesia problems Mother   . Cancer Mother     thyroid  . Hypertension Mother   . Anesthesia problems Sister   . Hypertension Sister   . Cancer Maternal Aunt   . Cancer Maternal Aunt   . Colon cancer Sister   . Stroke Sister   . Hypertension Brother     Social History Social History  Substance Use Topics  . Smoking status: Current Every Day Smoker    Packs/day: 0.25    Years: 20.00    Types: Cigarettes  . Smokeless tobacco: Never Used     Comment: 4-6 cigs/day  . Alcohol use 0.0 oz/week     Comment: occ     Allergies   Benazepril; Penicillins; Sulfa antibiotics; Ciprofloxacin hcl; Clindamycin/lincomycin; Erythromycin; Other; Zithromax [azithromycin]; and Sulfamethoxazole   Review of Systems Review of Systems  Constitutional: Negative for fever.  HENT: Positive for congestion, ear pain, postnasal drip and sinus pain.   Respiratory: Positive for cough.      Physical Exam Updated Vital Signs BP  143/77 (BP Location: Left Arm)   Pulse 60   Temp 98.8 F (37.1 C) (Oral)   Resp 15   Ht 5\' 7"  (1.702 m)   Wt 214 lb (97.1 kg)   SpO2 100%   BMI 33.52 kg/m   Physical Exam  Constitutional: She is oriented to person, place, and time. Vital signs are normal. She appears well-developed and well-nourished. No distress.  HENT:  Head: Normocephalic and atraumatic.  Right Ear: Hearing, tympanic membrane, external ear and ear canal normal.  Left Ear: Hearing, tympanic membrane, external ear and ear canal normal.  Nose: Right sinus exhibits maxillary sinus tenderness. Left sinus exhibits maxillary sinus tenderness.  Mouth/Throat:  Uvula is midline, oropharynx is clear and moist and mucous membranes are normal. No trismus in the jaw. No oropharyngeal exudate, posterior oropharyngeal edema, posterior oropharyngeal erythema or tonsillar abscesses.  Eyes: Conjunctivae and EOM are normal. Pupils are equal, round, and reactive to light.  Neck: Normal range of motion. Neck supple. No tracheal deviation present.  Cardiovascular: Normal rate, regular rhythm, S1 normal, S2 normal, normal heart sounds, intact distal pulses and normal pulses.   Pulmonary/Chest: Effort normal and breath sounds normal. No respiratory distress. She has no decreased breath sounds. She has no wheezes. She has no rhonchi. She has no rales.  Abdominal: Normal appearance and bowel sounds are normal. She exhibits no distension. There is no tenderness.  Musculoskeletal: Normal range of motion.  Neurological: She is alert and oriented to person, place, and time.  Skin: Skin is warm and dry.  Psychiatric: She has a normal mood and affect. Her speech is normal and behavior is normal. Thought content normal.  Nursing note and vitals reviewed.  ED Treatments / Results  DIAGNOSTIC STUDIES: Oxygen Saturation is 100% on RA, normal by my interpretation.  COORDINATION OF CARE:  3:55 PM Discussed treatment plan with pt at bedside and pt  agreed to plan.  Labs (all labs ordered are listed, but only abnormal results are displayed) Labs Reviewed - No data to display  EKG  EKG Interpretation None      Radiology No results found.  Procedures Procedures (including critical care time)  Medications Ordered in ED Medications - No data to display   Initial Impression / Assessment and Plan / ED Course  I have reviewed the triage vital signs and the nursing notes.  Pertinent labs & imaging results that were available during my care of the patient were reviewed by me and considered in my medical decision making (see chart for details).  Clinical Course    Final Clinical Impressions(s) / ED Diagnoses  I have reviewed the relevant previous healthcare records. I obtained HPI from historian.  ED Course:  Assessment: Patient complaining of symptoms of sinusitis. Moderate symptoms have been present for greater than 10 days with purulent nasal discharge and maxillary sinus pain.  Concern for acute bacterial maxillary sinusitis.  Patient discharged with Doxy.  Instructions given for warm saline nasal wash and recommendations for follow-up with primary care physician.      Disposition/Plan:  DC Home Additional Verbal discharge instructions given and discussed with patient.  Pt Instructed to f/u with PCP in the next week for evaluation and treatment of symptoms. Return precautions given Pt acknowledges and agrees with plan  Supervising Physician Arby BarretteMarcy Pfeiffer, MD   Final diagnoses:  Acute non-recurrent maxillary sinusitis   I personally performed the services described in this documentation, which was scribed in my presence. The recorded information has been reviewed and is accurate.   New Prescriptions New Prescriptions   DOXYCYCLINE (VIBRAMYCIN) 100 MG CAPSULE    Take 1 capsule (100 mg total) by mouth 2 (two) times daily.   FLUTICASONE (FLONASE) 50 MCG/ACT NASAL SPRAY    Place 1 spray into both nostrils daily.      Audry Piliyler Ahriyah Vannest, PA-C 07/27/16 1620    Arby BarretteMarcy Pfeiffer, MD 07/27/16 [id number]

## 2016-08-12 ENCOUNTER — Encounter (HOSPITAL_COMMUNITY): Payer: Self-pay

## 2016-08-12 ENCOUNTER — Emergency Department (HOSPITAL_COMMUNITY)
Admission: EM | Admit: 2016-08-12 | Discharge: 2016-08-12 | Disposition: A | Discharge status: Home / Self Care | Payer: Medicaid Other | Attending: Emergency Medicine | Admitting: Emergency Medicine

## 2016-08-12 DIAGNOSIS — F17210 Nicotine dependence, cigarettes, uncomplicated: Secondary | ICD-10-CM | POA: Diagnosis not present

## 2016-08-12 DIAGNOSIS — I10 Essential (primary) hypertension: Secondary | ICD-10-CM | POA: Diagnosis not present

## 2016-08-12 DIAGNOSIS — Z79899 Other long term (current) drug therapy: Secondary | ICD-10-CM | POA: Insufficient documentation

## 2016-08-12 DIAGNOSIS — E039 Hypothyroidism, unspecified: Secondary | ICD-10-CM | POA: Insufficient documentation

## 2016-08-12 NOTE — ED Triage Notes (Signed)
Patient complains of hypertension and head pressure since am. On arrival BP normal and states her head discomfort is now minimal. Patient alert and oriented, NAD.

## 2016-08-12 NOTE — ED Provider Notes (Signed)
[facility]-EMER[facility]ENCY DEPT Provider Note   CSN: [id number] Arrival date & time: 08/12/16  1547     History   Chief Complaint Chief Complaint  Patient presents with  . Hypertension    HPI [name] is a 55 y.o. female.  HPI   55 year old female history of hypertension presents today stating that her blood pressure was elevated earlier at home. [name] states that [name] checked it and her systolic blood pressure was 174. [name] had some headache in the right side of her head. It is unclear whether the headache preceded the elevated blood pressure. It has resolved on its own. [name] states that [name] has some crampy lower abdominal pain. [name] has not had any nausea, vomiting, or diarrhea. [name] states that [name] is being seen by her primary care physician and workup for gallstones and ultrasound scheduled next week. [name] states [name] is on 3 blood pressure medicines and takes them on the morning. [name] states that [name] does not normally follow her blood pressures but occasionally takes it [name] feels poorly. [name] states this is not unusually high for her. On arrival here it had decreased to 147 systolically. [name] denies any current headache, change in vision, difficulty speaking, neck pain, chest pain, dyspnea, current abdominal pain, UTI symptoms, or lateralized weakness.  Past Medical History:  Diagnosis Date  . Anemia   . Anxiety   . Arthritis   . Bipolar 1 disorder (HCC)   . Bursitis    hx of  . Chronic bronchitis (HCC)   . Complication of anesthesia    difficult to wake up and blood pressure drops  . Depression   . Diverticula of intestine   . Dysrhythmia    no medication treatment  . Eczema   . GERD (gastroesophageal reflux disease)   . Headache(784.0)   . Heart murmur   . Hypertension    sees Dr. Shana ChuteSpruill, is patients primary  . Hypothyroidism    takes synthroid  . Mental disorder    bipolar  . Pneumonia    hx    Patient Active Problem List   Diagnosis Date Noted  . Bipolar disorder, curr  episode mixed, severe, w/o psychotic features (HCC) 04/03/2016  . Alcohol use disorder, severe, dependence (HCC) 03/30/2016  . Alcohol abuse 01/09/2016  . 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 Diverticulitis 08/15/2015  . Diverticulitis of large intestine with abscess  11/23/2014  . Acute diverticulitis 11/23/2014  . Leukocytosis 11/17/2014  . Essential hypertension 11/17/2014  . Heart murmur   . Chronic bronchitis (HCC)   . Hypothyroidism   . Anxiety   . Depression   . Mental disorder   . GERD (gastroesophageal reflux disease)   . Simple chronic bronchitis (HCC)     Past Surgical History:  Procedure Laterality Date  . ABDOMINAL HYSTERECTOMY     has both of ovaries  . CESAREAN SECTION    . COLONOSCOPY    . LUMBAR LAMINECTOMY  08/22/2011   Procedure: MICRODISCECTOMY LUMBAR LAMINECTOMY;  Surgeon: Dorian HeckleJoseph D Stern, MD;  Location: [facility];  Service: Neurosurgery;  Laterality: Right;  RIGHT Lumbar Five-Sacral One microdiskectomy  . MOUTH SURGERY    . MULTIPLE EXTRACTIONS WITH ALVEOLOPLASTY N/A 08/20/2015   Procedure: Extraction of tooth #'s 1-12,14,20-27, 29 with alveoloplasty;  Surgeon: Charlynne Panderonald F Kulinski, DDS;  [facility];  Service: Oral Surgery;  Laterality: N/A;  . TUBAL LIGATION      OB History    No data available  Home Medications    Prior to Admission medications   Medication Sig Start Date End Date Taking? Authorizing Provider  albuterol (PROVENTIL HFA;VENTOLIN HFA) 108 (90 Base) MCG/ACT inhaler Inhale 2 puffs into the lungs every 4 (four) hours as needed for wheezing or shortness of breath. 04/04/16   Sanjuana KavaAgnes I Nwoko, NP  amLODipine (NORVASC) 10 MG tablet Take 1 tablet (10 mg total) by mouth daily. Take this instead of your LOTREL until you see your regular doctor: For high blood pressure 04/04/16   Sanjuana KavaAgnes I Nwoko, NP  atenolol (TENORMIN) 50 MG tablet Take 1 tablet (50 mg total) by mouth daily. Need Dr appt for future refills: For high blood pressure  04/04/16   Sanjuana KavaAgnes I Nwoko, NP  clonazePAM (KLONOPIN) 0.5 MG tablet Take 1 tablet (0.5 mg total) by mouth 2 (two) times daily as needed for anxiety. 06/25/16   Arby BarretteMarcy Pfeiffer, MD  doxycycline (VIBRAMYCIN) 100 MG capsule Take 1 capsule (100 mg total) by mouth 2 (two) times daily. 07/27/16   Audry Piliyler Mohr, PA-C  fluticasone (FLONASE) 50 MCG/ACT nasal spray Place 1 spray into both nostrils daily. 07/27/16   Audry Piliyler Mohr, PA-C  hydrOXYzine (ATARAX/VISTARIL) 25 MG tablet Take 1 tablet (25 mg total) by mouth every 6 (six) hours as needed for anxiety. Patient not taking: Reported on 06/25/2016 04/04/16   Sanjuana KavaAgnes I Nwoko, NP  lamoTRIgine (LAMICTAL) 25 MG tablet Take 1 tablet (25 mg total) by mouth daily. For mood stabilization Patient not taking: Reported on 06/25/2016 04/04/16   Sanjuana KavaAgnes I Nwoko, NP  levothyroxine (SYNTHROID, LEVOTHROID) 125 MCG tablet Take 1 tablet (125 mcg total) by mouth daily. For hypothyroidism 04/04/16   Sanjuana KavaAgnes I Nwoko, NP  polycarbophil (FIBERCON) 625 MG tablet Take 1 tablet (625 mg total) by mouth daily as needed (for regularity). Constipation 04/04/16   Sanjuana KavaAgnes I Nwoko, NP  polyethylene glycol (MIRALAX / GLYCOLAX) packet Take 17 g by mouth daily as needed for mild constipation. 04/04/16   Sanjuana KavaAgnes I Nwoko, NP  QUEtiapine (SEROQUEL XR) 50 MG TB24 24 hr tablet Take 2 tablets (100 mg total) by mouth at bedtime. For mood control Patient taking differently: Take 100 mg by mouth 2 (two) times daily. For mood control 04/04/16   Sanjuana KavaAgnes I Nwoko, NP    Family History Family History  Problem Relation Age of Onset  . Anesthesia problems Mother   . Cancer Mother     thyroid  . Hypertension Mother   . Anesthesia problems Sister   . Hypertension Sister   . Cancer Maternal Aunt   . Cancer Maternal Aunt   . Colon cancer Sister   . Stroke Sister   . Hypertension Brother     Social History Social History  Substance Use Topics  . Smoking status: Current Every Day Smoker    Packs/day: 0.25    Years: 20.00     Types: Cigarettes  . Smokeless tobacco: Never Used     Comment: 4-6 cigs/day  . Alcohol use 0.0 oz/week     Comment: occ     Allergies   Benazepril; Penicillins; Sulfa antibiotics; Ciprofloxacin hcl; Clindamycin/lincomycin; Erythromycin; Other; Zithromax [azithromycin]; and Sulfamethoxazole   Review of Systems Review of Systems  All other systems reviewed and are negative.    Physical Exam Updated Vital Signs BP 137/74   Pulse 63   Temp 98.6 F (37 C) (Oral)   Resp 17   SpO2 100%   Physical Exam  Constitutional: [name] is oriented to person, place, and time. [name] appears  well-developed and well-nourished. No distress.  HENT:  Head: Normocephalic and atraumatic.  Right Ear: External ear normal.  Left Ear: External ear normal.  Nose: Nose normal.  Eyes: Conjunctivae and EOM are normal. Pupils are equal, round, and reactive to light.  Neck: Normal range of motion. Neck supple.  Cardiovascular: Normal rate, regular rhythm and normal heart sounds.   Pulmonary/Chest: Effort normal and breath sounds normal.  Abdominal: Soft. Bowel sounds are normal. [name] exhibits no distension. There is no tenderness. There is no guarding.  Musculoskeletal: Normal range of motion.  Neurological: [name] is alert and oriented to person, place, and time. [name] displays normal reflexes. No cranial nerve deficit. [name] exhibits normal muscle tone. Coordination normal.  Patient ambulated with normal gait  Skin: Skin is warm and dry.  Psychiatric: [name] has a normal mood and affect. Her behavior is normal. Thought content normal.  Nursing note and vitals reviewed.    ED Treatments / Results  Labs (all labs ordered are listed, but only abnormal results are displayed) Labs Reviewed - No data to display   Radiology No results found.  Procedures Procedures (including critical care time)  Medications Ordered in ED Medications - No data to display   Initial Impression / Assessment and Plan / ED Course    I have reviewed the triage vital signs and the nursing notes.  Pertinent labs & imaging results that were available during my care of the patient were reviewed by me and considered in my medical decision making (see chart for details).  Clinical Course    Blood patient's blood pressure here is currently 137/74. [name] has a normal neurological exam. We have discussed trending her blood pressure at home and keep a log. We have discussed return precautions and need for follow-up and [name] voices understanding   Final Clinical Impressions(s) / ED Diagnoses   Final diagnoses:  Hypertension, unspecified type    New Prescriptions New Prescriptions   No medications on file     Margarita Grizzle, MD 08/12/16 1644

## 2016-08-12 NOTE — ED Provider Notes (Signed)
Not in room   Margarita Grizzleanielle Gerarda Conklin, MD 08/12/16 [id number]

## 2016-08-25 ENCOUNTER — Encounter (HOSPITAL_COMMUNITY): Payer: Self-pay | Admitting: *Deleted

## 2016-08-25 ENCOUNTER — Emergency Department (HOSPITAL_COMMUNITY)
Admission: EM | Admit: 2016-08-25 | Discharge: 2016-08-26 | Disposition: A | Discharge status: Home / Self Care | Payer: Medicaid Other | Attending: Emergency Medicine | Admitting: Emergency Medicine

## 2016-08-25 DIAGNOSIS — R1013 Epigastric pain: Secondary | ICD-10-CM | POA: Diagnosis present

## 2016-08-25 DIAGNOSIS — Z8719 Personal history of other diseases of the digestive system: Secondary | ICD-10-CM | POA: Insufficient documentation

## 2016-08-25 DIAGNOSIS — K30 Functional dyspepsia: Secondary | ICD-10-CM | POA: Diagnosis not present

## 2016-08-25 DIAGNOSIS — E039 Hypothyroidism, unspecified: Secondary | ICD-10-CM | POA: Diagnosis not present

## 2016-08-25 DIAGNOSIS — I10 Essential (primary) hypertension: Secondary | ICD-10-CM | POA: Diagnosis not present

## 2016-08-25 DIAGNOSIS — F17210 Nicotine dependence, cigarettes, uncomplicated: Secondary | ICD-10-CM | POA: Diagnosis not present

## 2016-08-25 LAB — COMPREHENSIVE METABOLIC PANEL
ALT: 14 U/L (ref 14–54)
AST: 18 U/L (ref 15–41)
Albumin: 4.2 g/dL (ref 3.5–5.0)
Alkaline Phosphatase: 74 U/L (ref 38–126)
Anion gap: 14 (ref 5–15)
BUN: <5 mg/dL — ABNORMAL LOW (ref 6–20)
CO2: 24 mmol/L (ref 22–32)
Calcium: 10.3 mg/dL (ref 8.9–10.3)
Chloride: 96 mmol/L — ABNORMAL LOW (ref 101–111)
Creatinine, Ser: 0.98 mg/dL (ref 0.44–1.00)
GFR calc Af Amer: >60 mL/min (ref >60)
GFR calc non Af Amer: >60 mL/min (ref >60)
Glucose, Bld: 127 mg/dL — ABNORMAL HIGH (ref 65–99)
Potassium: 3.1 mmol/L — ABNORMAL LOW (ref 3.5–5.1)
Sodium: 134 mmol/L — ABNORMAL LOW (ref 135–145)
Total Bilirubin: 0.7 mg/dL (ref 0.3–1.2)
Total Protein: 7.2 g/dL (ref 6.5–8.1)

## 2016-08-25 LAB — CBC
HCT: 43.2 % (ref 36.0–46.0)
Hemoglobin: 14.7 g/dL (ref 12.0–15.0)
MCH: 28.5 pg (ref 26.0–34.0)
MCHC: 34 g/dL (ref 30.0–36.0)
MCV: 83.7 fL (ref 78.0–100.0)
Platelets: 262 10*3/uL (ref 150–400)
RBC: 5.16 MIL/uL — ABNORMAL HIGH (ref 3.87–5.11)
RDW: 12.9 % (ref 11.5–15.5)
WBC: 6.7 10*3/uL (ref 4.0–10.5)

## 2016-08-25 LAB — URINALYSIS, ROUTINE W REFLEX MICROSCOPIC
Bilirubin Urine: NEGATIVE
Glucose, UA: NEGATIVE mg/dL
Hgb urine dipstick: NEGATIVE
Ketones, ur: NEGATIVE mg/dL
Leukocytes, UA: NEGATIVE
Nitrite: NEGATIVE
Protein, ur: NEGATIVE mg/dL
Specific Gravity, Urine: 1.001 — ABNORMAL LOW (ref 1.005–1.030)
pH: 7 (ref 5.0–8.0)

## 2016-08-25 LAB — LIPASE, BLOOD: Lipase: 15 U/L (ref 11–51)

## 2016-08-25 NOTE — ED Triage Notes (Signed)
The pt is c/o abd pain for 2 months  She has hot burning liquid up into her chest and her throat

## 2016-08-26 ENCOUNTER — Emergency Department (HOSPITAL_COMMUNITY): Payer: Medicaid Other

## 2016-08-26 LAB — TROPONIN I: Troponin I: <0.03 ng/mL (ref <0.03)

## 2016-08-26 MED ORDER — PANTOPRAZOLE SODIUM 20 MG PO TBEC: 20.0000 mg | DELAYED_RELEASE_TABLET | Freq: Every day | ORAL | 0 refills | Status: DC

## 2016-08-26 MED ORDER — GI COCKTAIL ~~LOC~~
30.0000 mL | Freq: Once | ORAL | Status: AC
  Administered 2016-08-26: 30 mL via ORAL
  Filled 2016-08-26: qty 30

## 2016-08-26 NOTE — ED Notes (Signed)
Delay in lab draw,  Pt not in room 

## 2016-08-26 NOTE — ED Notes (Signed)
Pt c/o mid upper chest burning intermittent x 2 months. Pt states burning begins in stomach and moves to throat, +nausea, burning worse after eating or drinking water.

## 2016-08-26 NOTE — ED Provider Notes (Signed)
[facility] Provider Note   CSN: [id number] Arrival date & time: 08/25/16  2048     History   Chief Complaint Chief Complaint  Patient presents with  . Abdominal Pain    HPI [name] is a 55 y.o. female.  Patient with a history of HTN, HLD, anxiety, GERD, hypothyroidism, depression presents with burning type epigastric pain radiating up into chest to throat. When it is radiating into chest, it also goes through to the back. She states symptoms started 2 months ago but have progressively worsened.  Worse with certain foods and when lying down. She reports history of GERD, not on medications for "years", currently taking TUMs and Gas-ex without relief. No fever. She reports nausea without vomiting. No change in bowel movements. She denies fever. No SOB or cough. She has been seen by her doctor, Dr. Shana ChuteSpruill, and has been referred to GI for further evaluation.    The history is provided by the patient. No language interpreter was used.    Past Medical History:  Diagnosis Date  . Anemia   . Anxiety   . Arthritis   . Bipolar 1 disorder (HCC)   . Bursitis    hx of  . Chronic bronchitis (HCC)   . Complication of anesthesia    difficult to wake up and blood pressure drops  . Depression   . Diverticula of intestine   . Dysrhythmia    no medication treatment  . Eczema   . GERD (gastroesophageal reflux disease)   . Headache(784.0)   . Heart murmur   . Hypertension    sees Dr. Shana ChuteSpruill, is patients primary  . Hypothyroidism    takes synthroid  . Mental disorder    bipolar  . Pneumonia    hx    Patient Active Problem List   Diagnosis Date Noted  . Bipolar disorder, curr episode mixed, severe, w/o psychotic features (HCC) 04/03/2016  . Alcohol use disorder, severe, dependence (HCC) 03/30/2016  . Alcohol abuse 01/09/2016  . 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 Diverticulitis 08/15/2015  . Diverticulitis of large intestine with abscess   11/23/2014  . Acute diverticulitis 11/23/2014  . Leukocytosis 11/17/2014  . Essential hypertension 11/17/2014  . Heart murmur   . Chronic bronchitis (HCC)   . Hypothyroidism   . Anxiety   . Depression   . Mental disorder   . GERD (gastroesophageal reflux disease)   . Simple chronic bronchitis (HCC)     Past Surgical History:  Procedure Laterality Date  . ABDOMINAL HYSTERECTOMY     has both of ovaries  . CESAREAN SECTION    . COLONOSCOPY    . LUMBAR LAMINECTOMY  08/22/2011   Procedure: MICRODISCECTOMY LUMBAR LAMINECTOMY;  Surgeon: Dorian HeckleJoseph D Stern, MD;  Location: [facility];  Service: Neurosurgery;  Laterality: Right;  RIGHT Lumbar Five-Sacral One microdiskectomy  . MOUTH SURGERY    . MULTIPLE EXTRACTIONS WITH ALVEOLOPLASTY N/A 08/20/2015   Procedure: Extraction of tooth #'s 1-12,14,20-27, 29 with alveoloplasty;  Surgeon: Charlynne Panderonald F Kulinski, DDS;  [facility];  Service: Oral Surgery;  Laterality: N/A;  . TUBAL LIGATION      OB History    No data available       Home Medications    Prior to Admission medications   Medication Sig Start Date End Date Taking? Authorizing Provider  albuterol (PROVENTIL HFA;VENTOLIN HFA) 108 (90 Base) MCG/ACT inhaler Inhale 2 puffs into the lungs every 4 (four) hours as needed for wheezing or shortness  of breath. 04/04/16   Sanjuana KavaAgnes I Nwoko, NP  amLODipine (NORVASC) 10 MG tablet Take 1 tablet (10 mg total) by mouth daily. Take this instead of your LOTREL until you see your regular doctor: For high blood pressure 04/04/16   Sanjuana KavaAgnes I Nwoko, NP  atenolol (TENORMIN) 50 MG tablet Take 1 tablet (50 mg total) by mouth daily. Need Dr appt for future refills: For high blood pressure 04/04/16   Sanjuana KavaAgnes I Nwoko, NP  clonazePAM (KLONOPIN) 0.5 MG tablet Take 1 tablet (0.5 mg total) by mouth 2 (two) times daily as needed for anxiety. 06/25/16   Arby BarretteMarcy Pfeiffer, MD  doxycycline (VIBRAMYCIN) 100 MG capsule Take 1 capsule (100 mg total) by mouth 2 (two) times daily. 07/27/16    Audry Piliyler Mohr, PA-C  fluticasone (FLONASE) 50 MCG/ACT nasal spray Place 1 spray into both nostrils daily. 07/27/16   Audry Piliyler Mohr, PA-C  hydrOXYzine (ATARAX/VISTARIL) 25 MG tablet Take 1 tablet (25 mg total) by mouth every 6 (six) hours as needed for anxiety. Patient not taking: Reported on 06/25/2016 04/04/16   Sanjuana KavaAgnes I Nwoko, NP  lamoTRIgine (LAMICTAL) 25 MG tablet Take 1 tablet (25 mg total) by mouth daily. For mood stabilization Patient not taking: Reported on 06/25/2016 04/04/16   Sanjuana KavaAgnes I Nwoko, NP  levothyroxine (SYNTHROID, LEVOTHROID) 125 MCG tablet Take 1 tablet (125 mcg total) by mouth daily. For hypothyroidism 04/04/16   Sanjuana KavaAgnes I Nwoko, NP  polycarbophil (FIBERCON) 625 MG tablet Take 1 tablet (625 mg total) by mouth daily as needed (for regularity). Constipation 04/04/16   Sanjuana KavaAgnes I Nwoko, NP  polyethylene glycol (MIRALAX / GLYCOLAX) packet Take 17 g by mouth daily as needed for mild constipation. 04/04/16   Sanjuana KavaAgnes I Nwoko, NP  QUEtiapine (SEROQUEL XR) 50 MG TB24 24 hr tablet Take 2 tablets (100 mg total) by mouth at bedtime. For mood control Patient taking differently: Take 100 mg by mouth 2 (two) times daily. For mood control 04/04/16   Sanjuana KavaAgnes I Nwoko, NP    Family History Family History  Problem Relation Age of Onset  . Anesthesia problems Mother   . Cancer Mother     thyroid  . Hypertension Mother   . Anesthesia problems Sister   . Hypertension Sister   . Cancer Maternal Aunt   . Cancer Maternal Aunt   . Colon cancer Sister   . Stroke Sister   . Hypertension Brother     Social History Social History  Substance Use Topics  . Smoking status: Current Every Day Smoker    Packs/day: 0.25    Years: 20.00    Types: Cigarettes  . Smokeless tobacco: Never Used     Comment: 4-6 cigs/day  . Alcohol use 0.0 oz/week     Comment: occ     Allergies   Benazepril; Penicillins; Sulfa antibiotics; Ciprofloxacin hcl; Clindamycin/lincomycin; Erythromycin; Other; Zithromax [azithromycin]; and  Sulfamethoxazole   Review of Systems Review of Systems  Constitutional: Negative for chills and fever.  HENT: Negative.  Negative for trouble swallowing.   Respiratory: Negative.  Negative for cough and shortness of breath.   Cardiovascular: Positive for chest pain (Burning).  Gastrointestinal: Positive for abdominal pain (Burning).  Musculoskeletal: Positive for back pain (See HPI.).  Neurological: Negative.  Negative for syncope, weakness and light-headedness.     Physical Exam Updated Vital Signs BP 118/65 (BP Location: Left Arm)   Pulse 68   Temp 98.5 F (36.9 C) (Oral)   Resp 14   Ht 5\' 7"  (1.702 m)  Wt 88.9 kg   SpO2 96%   BMI 30.71 kg/m   Physical Exam  Constitutional: She appears well-developed and well-nourished.  HENT:  Head: Normocephalic.  Neck: Normal range of motion. Neck supple.  Cardiovascular: Normal rate and regular rhythm.   No murmur heard. Pulmonary/Chest: Effort normal and breath sounds normal. She has no wheezes. She has no rales.  Abdominal: Soft. Bowel sounds are normal. There is tenderness (Epigastric tenderness. ). There is no rebound and no guarding.  Musculoskeletal: Normal range of motion.  Neurological: She is alert. No cranial nerve deficit.  Skin: Skin is warm and dry. No rash noted.  Psychiatric: She has a normal mood and affect.     ED Treatments / Results  Labs (all labs ordered are listed, but only abnormal results are displayed) Labs Reviewed  COMPREHENSIVE METABOLIC PANEL - Abnormal; Notable for the following:       Result Value   Sodium 134 (*)    Potassium 3.1 (*)    Chloride 96 (*)    Glucose, Bld 127 (*)    BUN <5 (*)    All other components within normal limits  CBC - Abnormal; Notable for the following:    RBC 5.16 (*)    All other components within normal limits  URINALYSIS, ROUTINE W REFLEX MICROSCOPIC - Abnormal; Notable for the following:    Color, Urine COLORLESS (*)    Specific Gravity, Urine 1.001 (*)      All other components within normal limits  LIPASE, BLOOD  TROPONIN I    EKG  EKG Interpretation  Date/Time:  Friday August 25 2016 21:04:44 EST Ventricular Rate:  89 PR Interval:  158 QRS Duration: 94 QT Interval:  342 QTC Calculation: 416 R Axis:   83 Text Interpretation:  Normal sinus rhythm Right atrial enlargement Left ventricular hypertrophy with repolarization abnormality Abnormal ECG When compared with ECG of 06/05/2016, Nonspecific ST and T wave abnormality is now Present Confirmed by [facility]  MD, DAVID ([id number]) on 08/26/2016 12:37:13 AM       Radiology No results found.  Procedures Procedures (including critical care time)  Medications Ordered in ED Medications  gi cocktail (Maalox,Lidocaine,Donnatal) (not administered)     Initial Impression / Assessment and Plan / ED Course  I have reviewed the triage vital signs and the nursing notes.  Pertinent labs & imaging results that were available during my care of the patient were reviewed by me and considered in my medical decision making (see chart for details).  Clinical Course     Patient complaining of symptoms of heartburn with history of GERD, not on regular medications. She has been taking TUMs and Gas-Ex without improvement.   EKG with nonspecific changes since last tracing. Troponin pending. She is given a GI cocktail with relief of symptoms.   Discussed with Dr. Preston Fleeting who feels the patient is stable for discharge home. Will provide Rx Prilosec  Final Clinical Impressions(s) / ED Diagnoses   Final diagnoses:  None   1. GERD  New Prescriptions New Prescriptions   No medications on file     Elpidio Anis, Cordelia Poche 08/26/16 0358    Dione Booze, MD 08/26/16 [phone]

## 2016-09-11 ENCOUNTER — Other Ambulatory Visit: Payer: Self-pay | Admitting: Gastroenterology

## 2016-09-11 DIAGNOSIS — R634 Abnormal weight loss: Secondary | ICD-10-CM

## 2016-09-18 ENCOUNTER — Ambulatory Visit
Admission: RE | Admit: 2016-09-18 | Discharge: 2016-09-18 | Disposition: A | Discharge status: Home / Self Care | Payer: Medicaid Other | Source: Ambulatory Visit | Attending: Gastroenterology | Admitting: Gastroenterology

## 2016-09-18 DIAGNOSIS — R634 Abnormal weight loss: Secondary | ICD-10-CM

## 2016-10-04 ENCOUNTER — Encounter (HOSPITAL_COMMUNITY): Payer: Self-pay

## 2016-10-04 ENCOUNTER — Emergency Department (HOSPITAL_COMMUNITY): Payer: Medicaid Other

## 2016-10-04 ENCOUNTER — Emergency Department (HOSPITAL_COMMUNITY)
Admission: EM | Admit: 2016-10-04 | Discharge: 2016-10-04 | Disposition: A | Discharge status: Home / Self Care | Payer: Medicaid Other | Attending: Emergency Medicine | Admitting: Emergency Medicine

## 2016-10-04 DIAGNOSIS — F17210 Nicotine dependence, cigarettes, uncomplicated: Secondary | ICD-10-CM | POA: Diagnosis not present

## 2016-10-04 DIAGNOSIS — I10 Essential (primary) hypertension: Secondary | ICD-10-CM | POA: Insufficient documentation

## 2016-10-04 DIAGNOSIS — Z79899 Other long term (current) drug therapy: Secondary | ICD-10-CM | POA: Diagnosis not present

## 2016-10-04 DIAGNOSIS — R634 Abnormal weight loss: Secondary | ICD-10-CM | POA: Diagnosis not present

## 2016-10-04 DIAGNOSIS — R110 Nausea: Secondary | ICD-10-CM | POA: Diagnosis present

## 2016-10-04 DIAGNOSIS — E039 Hypothyroidism, unspecified: Secondary | ICD-10-CM | POA: Diagnosis not present

## 2016-10-04 LAB — URINALYSIS, ROUTINE W REFLEX MICROSCOPIC
Bacteria, UA: NONE SEEN
Bilirubin Urine: NEGATIVE
Glucose, UA: NEGATIVE mg/dL
Hgb urine dipstick: NEGATIVE
Ketones, ur: 5 mg/dL — AB
Nitrite: NEGATIVE
Protein, ur: NEGATIVE mg/dL
Specific Gravity, Urine: 1.005 (ref 1.005–1.030)
pH: 6 (ref 5.0–8.0)

## 2016-10-04 LAB — COMPREHENSIVE METABOLIC PANEL
ALT: 14 U/L (ref 14–54)
AST: 20 U/L (ref 15–41)
Albumin: 4.2 g/dL (ref 3.5–5.0)
Alkaline Phosphatase: 55 U/L (ref 38–126)
Anion gap: 14 (ref 5–15)
BUN: <5 mg/dL — ABNORMAL LOW (ref 6–20)
CO2: 25 mmol/L (ref 22–32)
Calcium: 10.1 mg/dL (ref 8.9–10.3)
Chloride: 98 mmol/L — ABNORMAL LOW (ref 101–111)
Creatinine, Ser: 0.9 mg/dL (ref 0.44–1.00)
GFR calc Af Amer: >60 mL/min (ref >60)
GFR calc non Af Amer: >60 mL/min (ref >60)
Glucose, Bld: 105 mg/dL — ABNORMAL HIGH (ref 65–99)
Potassium: 3.2 mmol/L — ABNORMAL LOW (ref 3.5–5.1)
Sodium: 137 mmol/L (ref 135–145)
Total Bilirubin: 0.7 mg/dL (ref 0.3–1.2)
Total Protein: 6.9 g/dL (ref 6.5–8.1)

## 2016-10-04 LAB — CBC
HCT: 43.2 % (ref 36.0–46.0)
Hemoglobin: 14.4 g/dL (ref 12.0–15.0)
MCH: 28.1 pg (ref 26.0–34.0)
MCHC: 33.3 g/dL (ref 30.0–36.0)
MCV: 84.2 fL (ref 78.0–100.0)
Platelets: 281 10*3/uL (ref 150–400)
RBC: 5.13 MIL/uL — ABNORMAL HIGH (ref 3.87–5.11)
RDW: 13.5 % (ref 11.5–15.5)
WBC: 6.3 10*3/uL (ref 4.0–10.5)

## 2016-10-04 LAB — LIPASE, BLOOD: Lipase: 14 U/L (ref 11–51)

## 2016-10-04 MED ORDER — POTASSIUM CHLORIDE CRYS ER 20 MEQ PO TBCR
20.0000 meq | EXTENDED_RELEASE_TABLET | Freq: Once | ORAL | Status: AC
  Administered 2016-10-04: 20 meq via ORAL
  Filled 2016-10-04 (×2): qty 1

## 2016-10-04 MED ORDER — ONDANSETRON HCL 4 MG PO TABS: 4.0000 mg | ORAL_TABLET | Freq: Four times a day (QID) | ORAL | 0 refills | Status: AC

## 2016-10-04 MED ORDER — ONDANSETRON 4 MG PO TBDP
4.0000 mg | ORAL_TABLET | Freq: Once | ORAL | Status: AC
  Administered 2016-10-04: 4 mg via ORAL
  Filled 2016-10-04: qty 1

## 2016-10-04 NOTE — ED Provider Notes (Signed)
[facility]-EMER[facility]ENCY DEPT Provider Note   CSN: [id number] Arrival date & time: 10/04/16  1524     History   Chief Complaint Chief Complaint  Patient presents with  . Nausea  . Weight Loss    HPI [name] is a 56 y.o. female.  The history is provided by the patient. No language interpreter was used.   [name] is a 56 y.o. female who presents to the Emergency Department complaining of nausea, weight loss.  She reports intermittent nausea and ongoing weight loss since October 2017. She was previously 218 pounds and is now down to 196 with unintentional weight loss. She endorses sweats at times but nothing consistent. She has decreased appetite and nausea with decreased oral intake. No fevers, vomiting, abdominal pain. She recently had endoscopy and was diagnosed with H. pylori and has completed her treatment. She occasionally has a dry cough that is nonproductive as well as a burning sensation throughout her back. Her last mammogram was one year ago and she had a colonoscopy 2 years ago. Past Medical History:  Diagnosis Date  . Anemia   . Anxiety   . Arthritis   . Bipolar 1 disorder (HCC)   . Bursitis    hx of  . Chronic bronchitis (HCC)   . Complication of anesthesia    difficult to wake up and blood pressure drops  . Depression   . Diverticula of intestine   . Dysrhythmia    no medication treatment  . Eczema   . GERD (gastroesophageal reflux disease)   . Headache(784.0)   . Heart murmur   . Hypertension    sees Dr. Shana ChuteSpruill, is patients primary  . Hypothyroidism    takes synthroid  . Mental disorder    bipolar  . Pneumonia    hx    Patient Active Problem List   Diagnosis Date Noted  . Bipolar disorder, curr episode mixed, severe, w/o psychotic features (HCC) 04/03/2016  . Alcohol use disorder, severe, dependence (HCC) 03/30/2016  . Alcohol abuse 01/09/2016  . 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 Diverticulitis 08/15/2015  . Diverticulitis  of large intestine with abscess  11/23/2014  . Acute diverticulitis 11/23/2014  . Leukocytosis 11/17/2014  . Essential hypertension 11/17/2014  . Heart murmur   . Chronic bronchitis (HCC)   . Hypothyroidism   . Anxiety   . Depression   . Mental disorder   . GERD (gastroesophageal reflux disease)   . Simple chronic bronchitis (HCC)     Past Surgical History:  Procedure Laterality Date  . ABDOMINAL HYSTERECTOMY     has both of ovaries  . CESAREAN SECTION    . COLONOSCOPY    . LUMBAR LAMINECTOMY  08/22/2011   Procedure: MICRODISCECTOMY LUMBAR LAMINECTOMY;  Surgeon: Dorian HeckleJoseph D Stern, MD;  Location: [facility];  Service: Neurosurgery;  Laterality: Right;  RIGHT Lumbar Five-Sacral One microdiskectomy  . MOUTH SURGERY    . MULTIPLE EXTRACTIONS WITH ALVEOLOPLASTY N/A 08/20/2015   Procedure: Extraction of tooth #'s 1-12,14,20-27, 29 with alveoloplasty;  Surgeon: Charlynne Panderonald F Kulinski, DDS;  [facility];  Service: Oral Surgery;  Laterality: N/A;  . TUBAL LIGATION      OB History    No data available       Home Medications    Prior to Admission medications   Medication Sig Start Date End Date Taking? Authorizing Provider  albuterol (PROVENTIL HFA;VENTOLIN HFA) 108 (90 Base) MCG/ACT inhaler Inhale 2 puffs into the lungs every 4 (four) hours  as needed for wheezing or shortness of breath. 04/04/16  Yes Sanjuana Kava, NP  amLODipine (NORVASC) 10 MG tablet Take 1 tablet (10 mg total) by mouth daily. Take this instead of your LOTREL until you see your regular doctor: For high blood pressure Patient taking differently: Take 10 mg by mouth daily.  04/04/16  Yes Sanjuana Kava, NP  atenolol (TENORMIN) 50 MG tablet Take 1 tablet (50 mg total) by mouth daily. Need Dr appt for future refills: For high blood pressure Patient taking differently: Take 50 mg by mouth daily.  04/04/16  Yes Sanjuana Kava, NP  citalopram (CELEXA) 20 MG tablet Take 20 mg by mouth daily as needed (depression/anxiety).   Yes  Historical Provider, MD  hydrochlorothiazide (MICROZIDE) 12.5 MG capsule Take 12.5 mg by mouth daily.   Yes Historical Provider, MD  levothyroxine (SYNTHROID, LEVOTHROID) 125 MCG tablet Take 1 tablet (125 mcg total) by mouth daily. For hypothyroidism 04/04/16  Yes Sanjuana Kava, NP  neomycin-bacitracin-polymyxin (NEOSPORIN) OINT Apply 1 application topically 2 (two) times daily as needed (for skin irritation on hands).    Yes Historical Provider, MD  omeprazole (PRILOSEC) 40 MG capsule Take 40 mg by mouth 2 (two) times daily.   Yes Historical Provider, MD  PYLERA [phone] MG capsule Take 1 capsule by mouth daily. FOR 10 DAYS 09/25/16  Yes Historical Provider, MD  QUEtiapine (SEROQUEL) 25 MG tablet Take 6.25 mg by mouth at bedtime.   Yes Historical Provider, MD  ondansetron (ZOFRAN) 4 MG tablet Take 1 tablet (4 mg total) by mouth every 6 (six) hours. 10/04/16   Tilden Fossa, MD  pantoprazole (PROTONIX) 20 MG tablet Take 1 tablet (20 mg total) by mouth daily. Patient not taking: Reported on 10/04/2016 08/26/16   Elpidio Anis, PA-C  polycarbophil (FIBERCON) 625 MG tablet Take 1 tablet (625 mg total) by mouth daily as needed (for regularity). Constipation Patient not taking: Reported on 10/04/2016 04/04/16   Sanjuana Kava, NP  polyethylene glycol (MIRALAX / GLYCOLAX) packet Take 17 g by mouth daily as needed for mild constipation. Patient not taking: Reported on 10/04/2016 04/04/16   Sanjuana Kava, NP  QUEtiapine (SEROQUEL XR) 50 MG TB24 24 hr tablet Take 2 tablets (100 mg total) by mouth at bedtime. For mood control Patient not taking: Reported on 10/04/2016 04/04/16   Sanjuana Kava, NP    Family History Family History  Problem Relation Age of Onset  . Anesthesia problems Mother   . Cancer Mother     thyroid  . Hypertension Mother   . Anesthesia problems Sister   . Hypertension Sister   . Cancer Maternal Aunt   . Cancer Maternal Aunt   . Colon cancer Sister   . Stroke Sister   . Hypertension  Brother     Social History Social History  Substance Use Topics  . Smoking status: Current Every Day Smoker    Packs/day: 0.25    Years: 20.00    Types: Cigarettes  . Smokeless tobacco: Never Used     Comment: 4-6 cigs/day  . Alcohol use 0.0 oz/week     Comment: occ     Allergies   Benazepril; Penicillins; Sulfa antibiotics; Ciprofloxacin hcl; Clindamycin/lincomycin; Erythromycin; Other; Zithromax [azithromycin]; Sulfamethoxazole; and Tape   Review of Systems Review of Systems  All other systems reviewed and are negative.    Physical Exam Updated Vital Signs BP 132/77 (BP Location: Right Arm)   Pulse 65   Temp 99.1 F (37.3  C) (Oral)   Resp 22   SpO2 99%   Physical Exam  Constitutional: She is oriented to person, place, and time. She appears well-developed and well-nourished.  HENT:  Head: Normocephalic and atraumatic.  Cardiovascular: Normal rate and regular rhythm.   No murmur heard. Pulmonary/Chest: Effort normal and breath sounds normal. No respiratory distress.  Abdominal: Soft. There is no tenderness. There is no rebound and no guarding.  Musculoskeletal: She exhibits no edema or tenderness.  Neurological: She is alert and oriented to person, place, and time.  Skin: Skin is warm and dry.  Psychiatric: She has a normal mood and affect. Her behavior is normal.  Nursing note and vitals reviewed.    ED Treatments / Results  Labs (all labs ordered are listed, but only abnormal results are displayed) Labs Reviewed  COMPREHENSIVE METABOLIC PANEL - Abnormal; Notable for the following:       Result Value   Potassium 3.2 (*)    Chloride 98 (*)    Glucose, Bld 105 (*)    BUN <5 (*)    All other components within normal limits  CBC - Abnormal; Notable for the following:    RBC 5.13 (*)    All other components within normal limits  URINALYSIS, ROUTINE W REFLEX MICROSCOPIC - Abnormal; Notable for the following:    Ketones, ur 5 (*)    Leukocytes, UA TRACE  (*)    Squamous Epithelial / LPF 0-5 (*)    All other components within normal limits  LIPASE, BLOOD    EKG  EKG Interpretation None       Radiology Dg Chest 2 View  Result Date: 10/04/2016 CLINICAL DATA:  Cough common nausea, and 20 pound weight loss during past 4 months. EXAM: CHEST  2 VIEW COMPARISON:  08/26/2016 FINDINGS: The heart size and mediastinal contours are within normal limits. Both lungs are clear. The visualized skeletal structures are unremarkable. IMPRESSION: No active cardiopulmonary disease. Electronically Signed   By: Myles Rosenthal M.D.   On: 10/04/2016 19:11    Procedures Procedures (including critical care time)  Medications Ordered in ED Medications  potassium chloride SA (K-DUR,KLOR-CON) CR tablet 20 mEq (20 mEq Oral Given 10/04/16 1812)  ondansetron (ZOFRAN-ODT) disintegrating tablet 4 mg (4 mg Oral Given 10/04/16 1817)     Initial Impression / Assessment and Plan / ED Course  I have reviewed the triage vital signs and the nursing notes.  Pertinent labs & imaging results that were available during my care of the patient were reviewed by me and considered in my medical decision making (see chart for details).     Patient here for evaluation of nausea and weight loss. She is nontoxic appearing on examination in no  Acute distress. Electrolytes demonstrate mild hypokalemia but no other significant allergies. Discussed with patient unclear source of symptoms, recommend PCP and GI follow-up for close outpatient operative precautions.  Final Clinical Impressions(s) / ED Diagnoses   Final diagnoses:  Nausea    New Prescriptions Discharge Medication List as of 10/04/2016  7:39 PM    START taking these medications   Details  ondansetron (ZOFRAN) 4 MG tablet Take 1 tablet (4 mg total) by mouth every 6 (six) hours., Starting Wed 10/04/2016, Print         Tilden Fossa, MD 10/05/16 [phone]

## 2016-10-04 NOTE — ED Triage Notes (Signed)
Pt states loss of appetite, and weight loss since October. Pt states lost ~20 lbs. Pt also complaining of nausea ad generalized malaise.

## 2016-10-23 ENCOUNTER — Other Ambulatory Visit (HOSPITAL_COMMUNITY): Payer: Self-pay | Admitting: Gastroenterology

## 2016-10-23 DIAGNOSIS — K2970 Gastritis, unspecified, without bleeding: Secondary | ICD-10-CM

## 2016-10-24 ENCOUNTER — Other Ambulatory Visit: Payer: Self-pay | Admitting: Cardiology

## 2016-10-24 DIAGNOSIS — Z1231 Encounter for screening mammogram for malignant neoplasm of breast: Secondary | ICD-10-CM

## 2016-10-31 ENCOUNTER — Encounter (HOSPITAL_COMMUNITY)
Admission: RE | Admit: 2016-10-31 | Discharge: 2016-10-31 | Disposition: A | Discharge status: Home / Self Care | Payer: Medicaid Other | Source: Ambulatory Visit | Attending: Gastroenterology | Admitting: Gastroenterology

## 2016-10-31 DIAGNOSIS — K2970 Gastritis, unspecified, without bleeding: Secondary | ICD-10-CM | POA: Diagnosis not present

## 2016-10-31 MED ORDER — TECHNETIUM TC 99M SULFUR COLLOID
2.0000 | Freq: Once | INTRAVENOUS | Status: AC | PRN
  Administered 2016-10-31: 2 via ORAL

## 2016-11-07 ENCOUNTER — Ambulatory Visit
Admission: RE | Admit: 2016-11-07 | Discharge: 2016-11-07 | Disposition: A | Discharge status: Home / Self Care | Payer: Medicaid Other | Source: Ambulatory Visit | Attending: Cardiology | Admitting: Cardiology

## 2016-11-07 DIAGNOSIS — Z1231 Encounter for screening mammogram for malignant neoplasm of breast: Secondary | ICD-10-CM

## 2016-11-09 ENCOUNTER — Other Ambulatory Visit: Payer: Self-pay | Admitting: Cardiology

## 2016-11-09 DIAGNOSIS — R079 Chest pain, unspecified: Secondary | ICD-10-CM

## 2016-11-24 ENCOUNTER — Other Ambulatory Visit: Payer: Self-pay | Admitting: Gastroenterology

## 2016-11-24 ENCOUNTER — Other Ambulatory Visit (HOSPITAL_COMMUNITY): Payer: Self-pay | Admitting: Gastroenterology

## 2016-11-24 DIAGNOSIS — R109 Unspecified abdominal pain: Secondary | ICD-10-CM

## 2016-11-24 DIAGNOSIS — R634 Abnormal weight loss: Secondary | ICD-10-CM

## 2016-11-29 ENCOUNTER — Other Ambulatory Visit: Payer: Self-pay

## 2016-12-01 ENCOUNTER — Ambulatory Visit
Admission: RE | Admit: 2016-12-01 | Discharge: 2016-12-01 | Disposition: A | Discharge status: Home / Self Care | Payer: Medicaid Other | Source: Ambulatory Visit | Attending: Gastroenterology | Admitting: Gastroenterology

## 2016-12-01 DIAGNOSIS — R634 Abnormal weight loss: Secondary | ICD-10-CM

## 2016-12-01 DIAGNOSIS — R109 Unspecified abdominal pain: Secondary | ICD-10-CM

## 2016-12-01 MED ORDER — IOPAMIDOL (ISOVUE-300) INJECTION 61%
100.0000 mL | Freq: Once | INTRAVENOUS | Status: AC | PRN
  Administered 2016-12-01: 100 mL via INTRAVENOUS

## 2016-12-04 ENCOUNTER — Ambulatory Visit (HOSPITAL_COMMUNITY)
Admission: RE | Admit: 2016-12-04 | Discharge: 2016-12-04 | Disposition: A | Discharge status: Home / Self Care | Payer: Medicaid Other | Source: Ambulatory Visit | Attending: Gastroenterology | Admitting: Gastroenterology

## 2016-12-06 ENCOUNTER — Encounter (HOSPITAL_COMMUNITY): Payer: Medicaid Other

## 2016-12-12 ENCOUNTER — Ambulatory Visit (HOSPITAL_COMMUNITY)
Admission: RE | Admit: 2016-12-12 | Discharge: 2016-12-12 | Disposition: A | Discharge status: Home / Self Care | Payer: Medicaid Other | Source: Ambulatory Visit | Attending: Gastroenterology | Admitting: Gastroenterology

## 2016-12-12 DIAGNOSIS — R109 Unspecified abdominal pain: Secondary | ICD-10-CM | POA: Insufficient documentation

## 2016-12-12 IMAGING — CT CT ABD-PELV W/ CM
2 of 5 series · 11 of 46 positions shown, 12 images · IV contrast (Iodine)
Comparison: 12/24/2014

CLINICAL DATA: Left lower quadrant abdominal pain starting
[REDACTED]. History of diverticulitis.

EXAM:
CT ABDOMEN AND PELVIS WITH CONTRAST
TECHNIQUE: Multidetector CT imaging of the abdomen and pelvis was performed
using the standard protocol following bolus administration of
intravenous contrast.
CONTRAST:  100mL OMNIPAQUE IOHEXOL 300 MG/ML  SOLN

[Series 201: routine, idose (2) · axial · 0.70mm/px · z∈[-599,-259]mm · 8 of 86 slices shown, 9 images]
[im 9/86  soft-tissue]
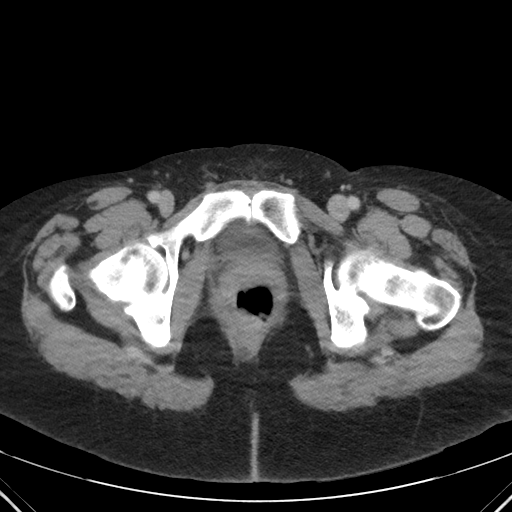
[im 9/86  bone]
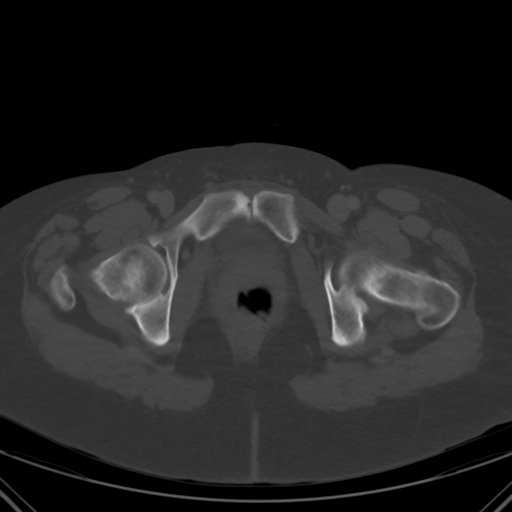
[im 18/86  soft-tissue]
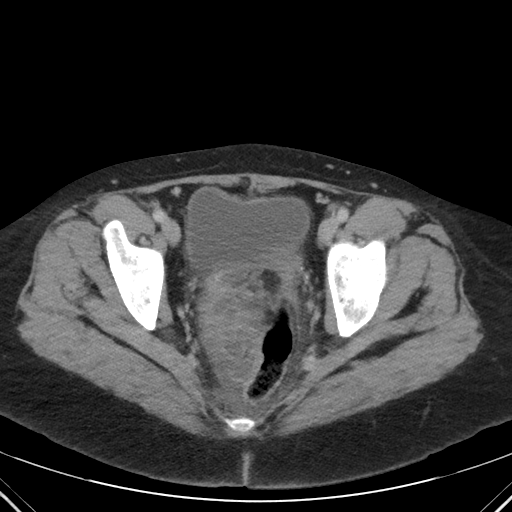
[im 27/86  soft-tissue]
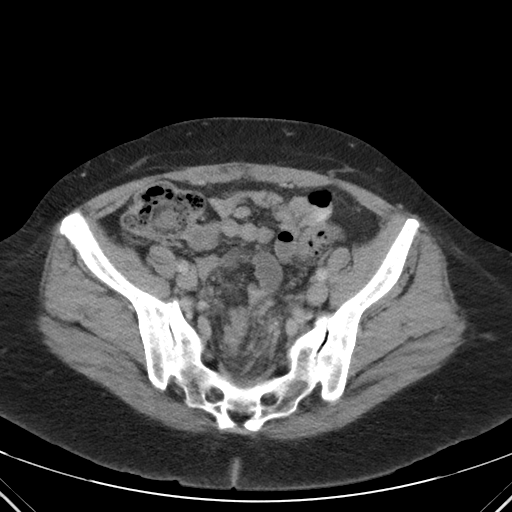
[im 36/86  soft-tissue]
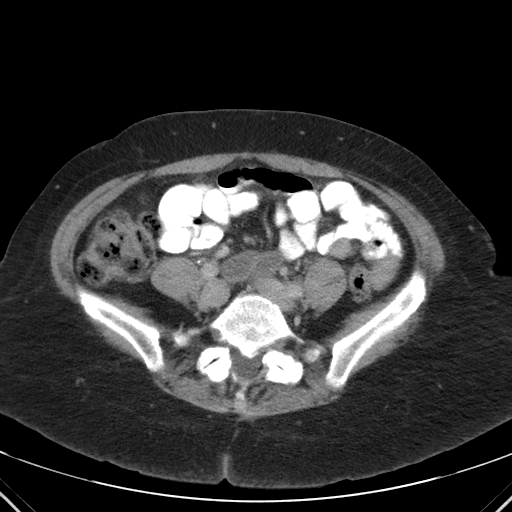
[im 50/86  soft-tissue]
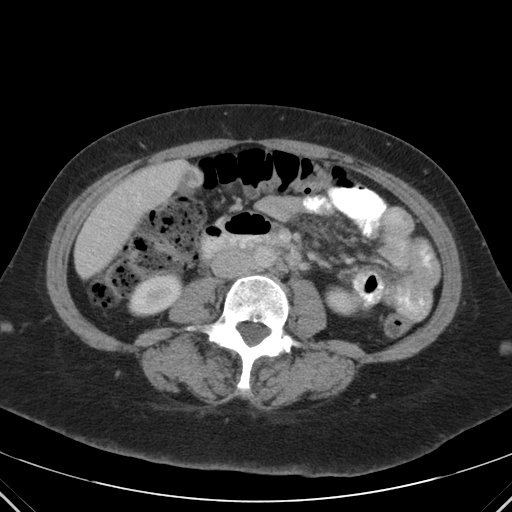
[im 59/86  soft-tissue]
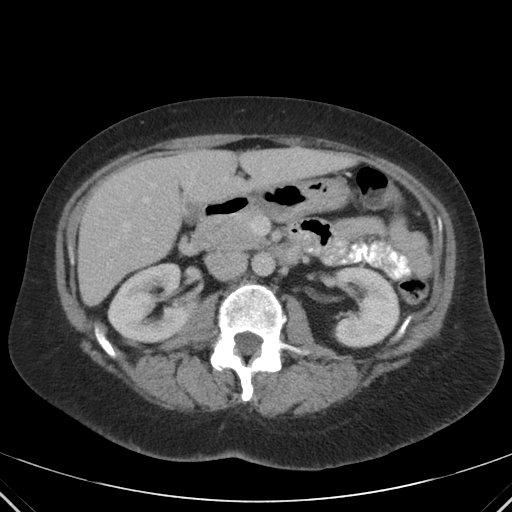
[im 68/86  soft-tissue]
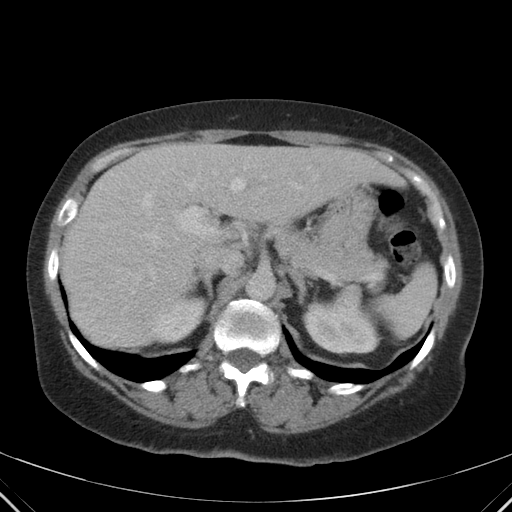
[im 77/86  soft-tissue]
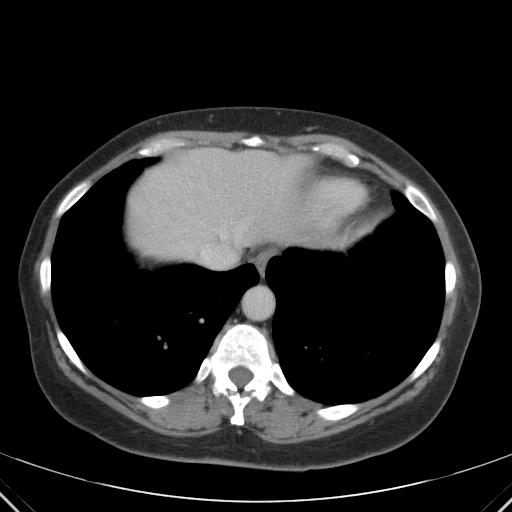

[Series 203: coronals, idose (2) · coronal · 0.45mm/px · 3 of 95 slices shown]
[im 32/95  soft-tissue]
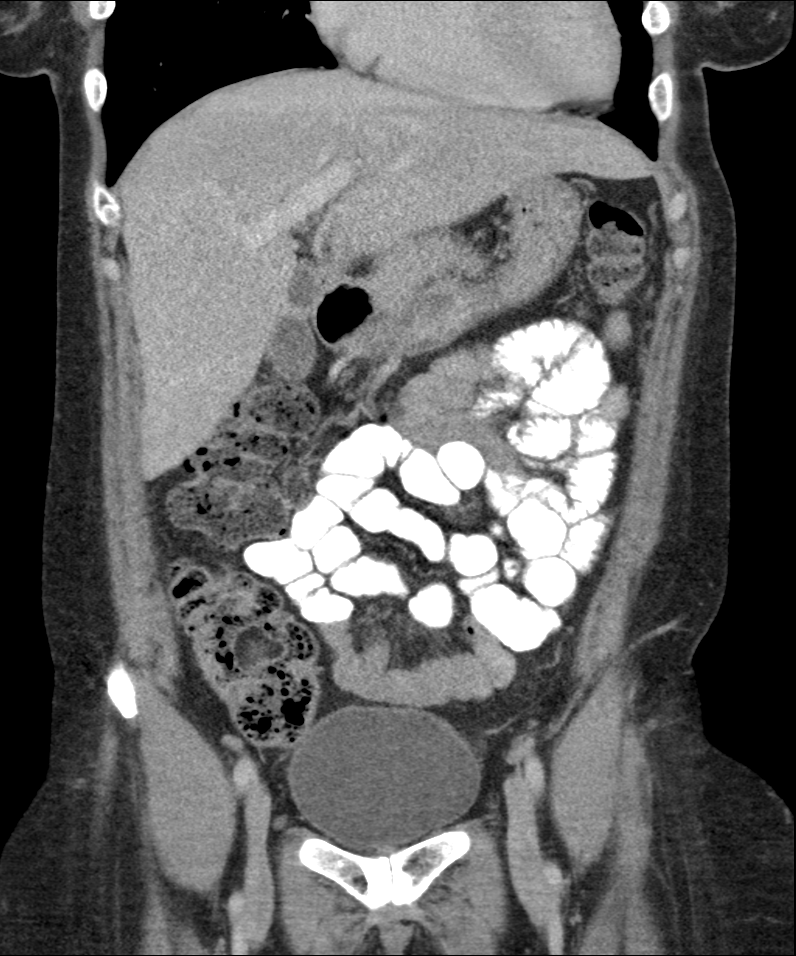
[im 42/95  soft-tissue]
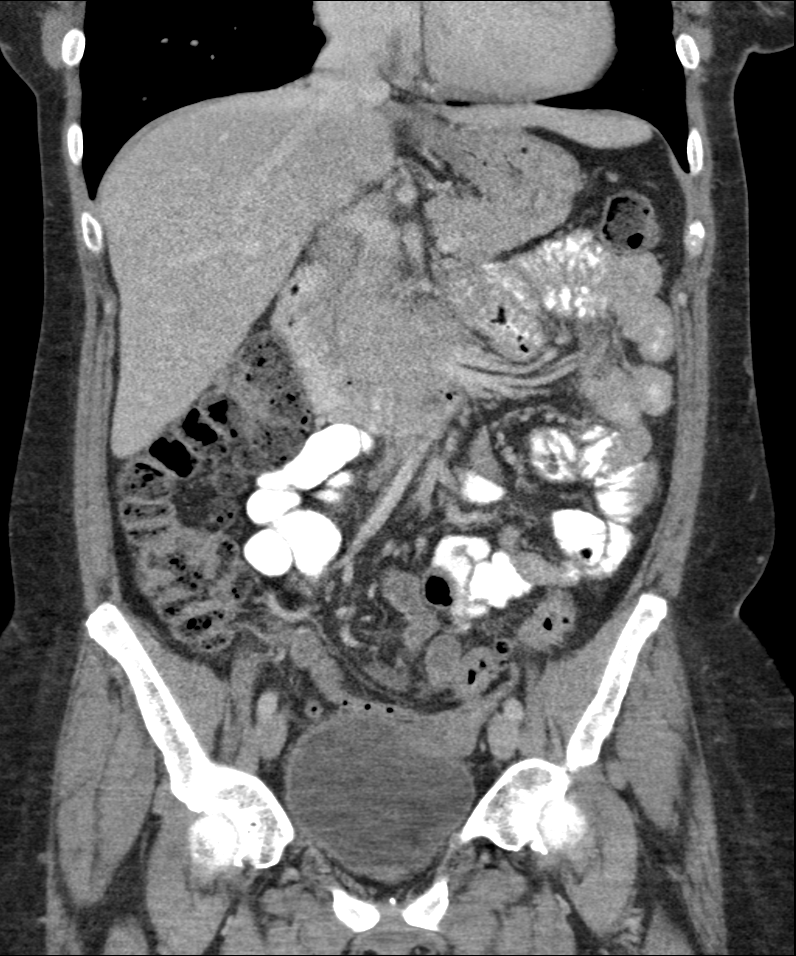
[im 53/95  soft-tissue]
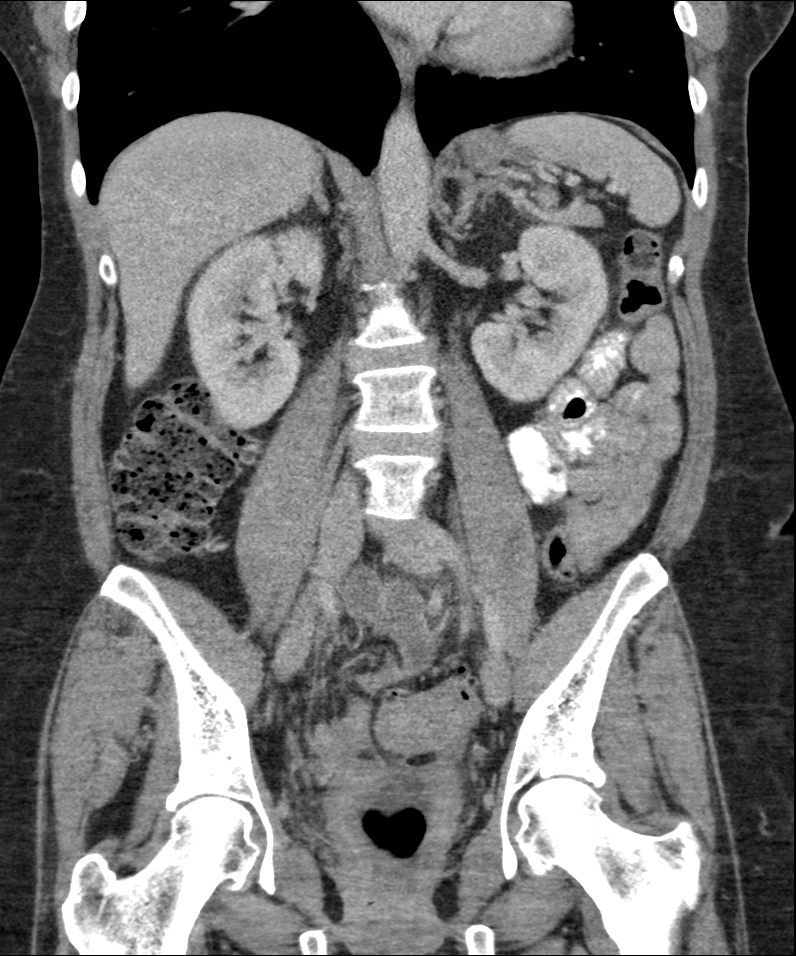

[11 of 46 positions shown; findings below may reference images not displayed]

FINDINGS: Lower chest:  Minimal scarring in the lingula and right middle lobe.

Hepatobiliary: Punctate calcification along the anterior dome of the
left hepatic lobe, likely from remote inflammatory reaction. There
is a fold causing narrowing in the central gallbladder very similar
in appearance to 12/24/14, but probably incidental. No biliary
dilatation or significant focal hepatic lesion identified.

Pancreas: Unremarkable

Spleen: Unremarkable

Adrenals/Urinary Tract: 5 mm hypodense lesion in the right mid
kidney posteriorly, image 7 series 301, not appreciably changed from
prior. This is statistically likely to be a cyst but technically
nonspecific.

Stomach/Bowel: There is sigmoid diverticulosis along with marked
wall thickening in the distal sigmoid colon near the rectal
junction, with abnormal density and fluid along the right side of
the sigmoid colon, poor definition of fat planes, and questionable
loculations of extraluminal gas. A loop of small bowel appears to be
matted down immediately in this vicinity and confused is the
appearance. There is abnormal adjacent presacral edema and edema
tracking along the adjacent fat planes. I am suspicious for early
abscess formation adjacent to the distal sigmoid, measuring about
2.0 by 1.2 cm on image 67 series 201. Possible additional abscess to
the right of the rectosigmoid junction measuring about 2.7 by 1.4 cm
on image 78 series 203, although very indistinct. There is
previously wall thickening in this region but much less striking.

The appendix appears normal.

Vascular/Lymphatic: Small pelvic lymph nodes are not pathologically
enlarged by size criteria.

Reproductive: Uterus absent.  Ovaries unremarkable.

Other: No supplemental non-categorized findings.

Musculoskeletal: Sclerosis of the pubic bodies. Lower lumbar
spondylosis noted causing mild left foraminal stenosis at L5-S1 and
potentially at L4-5.
IMPRESSION: 1. Marked wall thickening in the distal sigmoid colon with tiny
locules of extraluminal gas suggesting local micro perforation, as
well as vague ill-defined fluid densities along the or right wall of
the distal sigmoid suspicious for early abscesses. There is
extensive confluent stranding and poor definition of fat planes in
this vicinity, and also presacral edema. This could represent
recurrent sigmoid diverticulitis with microperforation and early
abscess formation. Please note that a distal sigmoid colon tumor
with perforation is a differential diagnostic consideration and
following or during treatment of the patient's current acute
process, the possibility of malignancy should be investigated.
2. Other imaging findings of potential clinical significance: Mild
osteitis pubis. Lower lumbar spondylosis causing mild left foraminal
stenosis at L5-S1 and perhaps L4-5.

## 2016-12-12 MED ORDER — TECHNETIUM TC 99M MEBROFENIN IV KIT
5.0000 | PACK | Freq: Once | INTRAVENOUS | Status: AC | PRN
  Administered 2016-12-12: 5 via INTRAVENOUS

## 2016-12-20 ENCOUNTER — Ambulatory Visit (HOSPITAL_COMMUNITY): Payer: Medicaid Other

## 2016-12-25 ENCOUNTER — Ambulatory Visit (HOSPITAL_COMMUNITY): Payer: Medicaid Other

## 2017-01-15 ENCOUNTER — Encounter (HOSPITAL_COMMUNITY): Payer: Self-pay | Admitting: Dentistry

## 2017-01-19 ENCOUNTER — Encounter (HOSPITAL_COMMUNITY): Admission: RE | Admit: 2017-01-19 | Payer: Medicaid Other | Source: Ambulatory Visit

## 2017-01-25 ENCOUNTER — Ambulatory Visit (HOSPITAL_COMMUNITY): Payer: Self-pay | Admitting: Dentistry

## 2017-01-25 ENCOUNTER — Encounter (HOSPITAL_COMMUNITY): Payer: Self-pay | Admitting: Dentistry

## 2017-01-25 VITALS — BP 112/54 | HR 55 | Temp 98.6°F

## 2017-01-25 DIAGNOSIS — Z972 Presence of dental prosthetic device (complete) (partial): Secondary | ICD-10-CM

## 2017-01-25 DIAGNOSIS — K08109 Complete loss of teeth, unspecified cause, unspecified class: Secondary | ICD-10-CM

## 2017-01-25 DIAGNOSIS — R634 Abnormal weight loss: Secondary | ICD-10-CM

## 2017-01-25 DIAGNOSIS — K0820 Unspecified atrophy of edentulous alveolar ridge: Secondary | ICD-10-CM | POA: Diagnosis not present

## 2017-01-25 DIAGNOSIS — Z463 Encounter for fitting and adjustment of dental prosthetic device: Secondary | ICD-10-CM

## 2017-01-25 DIAGNOSIS — R220 Localized swelling, mass and lump, head: Secondary | ICD-10-CM

## 2017-01-25 NOTE — Patient Instructions (Addendum)
Plan/recommendations: 1. Keep dentures out if sore spots arise. 2. Use salt water rinses as needed to aid healing. 3. Return to clinic as scheduled or call if problems arise before then. 4. We did discuss possible referral for implant therapy if patient so desires. Patient currently refuses referral at this time.   Charlynne Panderonald F. Aaryan Essman, DDS

## 2017-01-25 NOTE — Progress Notes (Signed)
01/25/2017  Patient Name:   [name] Date of Birth:   [birth year] Medical Record Number: [id number]   BP (!) 112/54 (BP Location: Left Arm)   Pulse (!) 55   Temp 98.6 F (37 C) (Oral)   [name] is a 56 year old female that presents for periodic oral exam and evaluation of upper and lower complete dentures. Patient was initially seen in December 2016 for evaluation of facial swelling. Patient had full mouth extractions on 08/20/2015 in the operating room. Upper and lower complete dentures were then fabricated and inserted on 11/02/2015. She has been seen for multiple denture adjustment appointments since then.  Patient now presents for oral examination and evaluation of upper and lower complete dentures.  Patient Active Problem List   Diagnosis Date Noted  . Bipolar disorder, curr episode mixed, severe, w/o psychotic features (HCC) 04/03/2016  . Alcohol use disorder, severe, dependence (HCC) 03/30/2016  . Alcohol abuse 01/09/2016  . 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 Diverticulitis 08/15/2015  . Diverticulitis of large intestine with abscess  11/23/2014  . Acute diverticulitis 11/23/2014  . Leukocytosis 11/17/2014  . Essential hypertension 11/17/2014  . Heart murmur   . Chronic bronchitis (HCC)   . Hypothyroidism   . Anxiety   . Depression   . Mental disorder   . GERD (gastroesophageal reflux disease)   . Simple chronic bronchitis (HCC)      Medical Hx Update:  Past Medical History:  Diagnosis Date  . Anemia   . Anxiety   . Arthritis   . Bipolar 1 disorder (HCC)   . Bursitis    hx of  . Chronic bronchitis (HCC)   . Complication of anesthesia    difficult to wake up and blood pressure drops  . Depression   . Diverticula of intestine   . Dysrhythmia    no medication treatment  . Eczema   . GERD (gastroesophageal reflux disease)   . Headache(784.0)   . Heart murmur   . Hypertension    sees Dr. Shana Chute, is patients primary  . Hypothyroidism     takes synthroid  . Mental disorder    bipolar  . Pneumonia    hx  .  Past Surgical History:  Procedure Laterality Date  . ABDOMINAL HYSTERECTOMY     has both of ovaries  . CESAREAN SECTION    . COLONOSCOPY    . LUMBAR LAMINECTOMY  08/22/2011   Procedure: MICRODISCECTOMY LUMBAR LAMINECTOMY;  Surgeon: Dorian Heckle, MD;  Location: [facility];  Service: Neurosurgery;  Laterality: Right;  RIGHT Lumbar Five-Sacral One microdiskectomy  . MOUTH SURGERY    . MULTIPLE EXTRACTIONS WITH ALVEOLOPLASTY N/A 08/20/2015   Procedure: Extraction of tooth #'s 1-12,14,20-27, 29 with alveoloplasty;  Surgeon: Charlynne Pander, DDS;  Location: [facility] OR;  Service: Oral Surgery;  Laterality: N/A;  . TUBAL LIGATION      ALLERGIES/ADVERSE DRUG REACTIONS: Allergies  Allergen Reactions  . Benazepril Anaphylaxis, Hives and Itching  . Penicillins Hives, Itching and Swelling    Face: itching/swelling Has patient had a PCN reaction causing immediate rash, facial/tongue/throat swelling, SOB or lightheadedness with hypotension: Yes Has patient had a PCN reaction causing severe rash involving mucus membranes or skin necrosis: Unk Has patient had a PCN reaction that required hospitalization: No Has patient had a PCN reaction occurring within the last 10 years: No If all of the above answers are "NO", then may proceed with Cephalosporin use.   . Sulfa Antibiotics Hives  Itching and facial swelling  . Ciprofloxacin Hcl Hives    "funny sensation in gums"  . Clindamycin/Lincomycin Hives and Itching    Welts (also)  . Erythromycin Hives and Itching  . Other     Pt has problems with being awaken from anesthesia, Her blood pressure drops very low  . Zithromax [Azithromycin] Hives and Itching  . Sulfamethoxazole Rash  . Tape Itching and Rash    MEDICATIONS: Current Outpatient Prescriptions  Medication Sig Dispense Refill  . albuterol (PROVENTIL HFA;VENTOLIN HFA) 108 (90 Base) MCG/ACT inhaler Inhale 2  puffs into the lungs every 4 (four) hours as needed for wheezing or shortness of breath. 1 Inhaler 0  . amLODipine (NORVASC) 10 MG tablet Take 1 tablet (10 mg total) by mouth daily. Take this instead of your LOTREL until you see your regular doctor: For high blood pressure (Patient taking differently: Take 10 mg by mouth daily. ) 1 tablet 0  . atenolol (TENORMIN) 50 MG tablet Take 1 tablet (50 mg total) by mouth daily. Need Dr appt for future refills: For high blood pressure (Patient taking differently: Take 50 mg by mouth daily. ) 1 tablet 0  . citalopram (CELEXA) 20 MG tablet Take 20 mg by mouth daily as needed (depression/anxiety).    . hydrochlorothiazide (MICROZIDE) 12.5 MG capsule Take 12.5 mg by mouth daily.    Marland Kitchen. levothyroxine (SYNTHROID, LEVOTHROID) 125 MCG tablet Take 1 tablet (125 mcg total) by mouth daily. For hypothyroidism 1 tablet 0  . neomycin-bacitracin-polymyxin (NEOSPORIN) OINT Apply 1 application topically 2 (two) times daily as needed (for skin irritation on hands).     Marland Kitchen. omeprazole (PRILOSEC) 40 MG capsule Take 40 mg by mouth 2 (two) times daily.    . ondansetron (ZOFRAN) 4 MG tablet Take 1 tablet (4 mg total) by mouth every 6 (six) hours. 12 tablet 0  . pantoprazole (PROTONIX) 20 MG tablet Take 1 tablet (20 mg total) by mouth daily. (Patient not taking: Reported on 10/04/2016) 15 tablet 0  . polycarbophil (FIBERCON) 625 MG tablet Take 1 tablet (625 mg total) by mouth daily as needed (for regularity). Constipation (Patient not taking: Reported on 10/04/2016)    . polyethylene glycol (MIRALAX / GLYCOLAX) packet Take 17 g by mouth daily as needed for mild constipation. (Patient not taking: Reported on 10/04/2016) 14 each 0  . PYLERA 140-125-125 MG capsule Take 1 capsule by mouth daily. FOR 10 DAYS  0  . QUEtiapine (SEROQUEL XR) 50 MG TB24 24 hr tablet Take 2 tablets (100 mg total) by mouth at bedtime. For mood control (Patient not taking: Reported on 10/04/2016) 60 each 0  . QUEtiapine  (SEROQUEL) 25 MG tablet Take 6.25 mg by mouth at bedtime.     No current facility-administered medications for this visit.     C/C: Patient Is complaining of several areas of irritation since her recent " 50 pound weight loss " and history of nausea and vomiting.   HPI:  [name] is a 56 year old female that presents for periodic oral exam and evaluation of upper and lower complete dentures. Patient was initially seen in December 2016 for evaluation of facial swelling. Patient had full mouth extractions on 08/20/2015 in the operating room. Upper and lower complete dentures were then fabricated and inserted on 11/02/2015. She has been seen for multiple denture adjustment appointments since then.  Patient now presents for oral examination and evaluation of upper and lower complete dentures.   DENTAL EXAM: General: Patient is a  well-developed, well-nourished female in no acute distress. Vitals: BP (!) 112/54 (BP Location: Left Arm)   Pulse (!) 55   Temp 98.6 F (37 C) (Oral)  Extraoral Exam: There is no neck lymphadenopathy palpated. The patient denies acute TMJ symptoms. Intraoral  Exam: Patient has normal saliva. There is no evidence of denture irritation or erythema. Dentition: Patient is edentulous. There is atrophy of the lower edentulous alveolar ridge. Prosthodontic: Patient has an upper lower complete dentures that are stable and retentive. Pressure indicating paste was applied to the upper and lower dentures. Significant denture adjustments were made on the intaglio surface. Thick PIP applied to lower denture borders. Adjustments made as needed. Dentures were then polished. Occlusion:  Occlusion was evaluated and is acceptable in centric relation and protrusive strokes. Patient with tendency to protrude lower jaw in and end to end position.  Patient was instructed on finding maximum intercuspation position. Patient expressed understanding of why she needs to find the maximum  intercuspation position to prevent instability of the dentures during occlusion.  Assessments: 1. Patient is edentulous 2. There is atrophy of the edentulous alveolar ridges. 3. Upper and lower complete dentures are stable and retentive.  Plan/recommendations: 1. Keep dentures out if sore spots arise. 2. Use salt water rinses as needed to aid healing. 3. Return to clinic as scheduled or call if problems arise before then. 4. We did discuss possible referral for implant therapy if patient so desires. Patient currently refuses referral at this time.   Charlynne Pander, DDS

## 2017-01-28 DIAGNOSIS — R630 Anorexia: Secondary | ICD-10-CM | POA: Insufficient documentation

## 2017-01-28 DIAGNOSIS — R634 Abnormal weight loss: Secondary | ICD-10-CM | POA: Insufficient documentation

## 2017-01-28 DIAGNOSIS — G8929 Other chronic pain: Secondary | ICD-10-CM | POA: Insufficient documentation

## 2017-01-28 DIAGNOSIS — M549 Dorsalgia, unspecified: Secondary | ICD-10-CM | POA: Insufficient documentation

## 2017-01-28 DIAGNOSIS — F410 Panic disorder [episodic paroxysmal anxiety] without agoraphobia: Secondary | ICD-10-CM | POA: Insufficient documentation

## 2017-01-30 ENCOUNTER — Encounter (HOSPITAL_COMMUNITY): Payer: Self-pay | Admitting: Dentistry

## 2017-02-05 ENCOUNTER — Ambulatory Visit (INDEPENDENT_AMBULATORY_CARE_PROVIDER_SITE_OTHER): Payer: Medicaid Other | Admitting: Obstetrics

## 2017-02-05 ENCOUNTER — Encounter: Payer: Self-pay | Admitting: Obstetrics

## 2017-02-05 ENCOUNTER — Other Ambulatory Visit (HOSPITAL_COMMUNITY)
Admission: RE | Admit: 2017-02-05 | Discharge: 2017-02-05 | Disposition: A | Discharge status: Home / Self Care | Payer: Medicaid Other | Source: Ambulatory Visit | Attending: Obstetrics | Admitting: Obstetrics

## 2017-02-05 VITALS — BP 107/72 | HR 61 | Wt 155.8 lb

## 2017-02-05 DIAGNOSIS — Z0000 Encounter for general adult medical examination without abnormal findings: Secondary | ICD-10-CM

## 2017-02-05 DIAGNOSIS — Z01419 Encounter for gynecological examination (general) (routine) without abnormal findings: Secondary | ICD-10-CM | POA: Diagnosis not present

## 2017-02-05 DIAGNOSIS — Z113 Encounter for screening for infections with a predominantly sexual mode of transmission: Secondary | ICD-10-CM

## 2017-02-05 DIAGNOSIS — R52 Pain, unspecified: Secondary | ICD-10-CM

## 2017-02-05 DIAGNOSIS — R634 Abnormal weight loss: Secondary | ICD-10-CM

## 2017-02-05 MED ORDER — IBUPROFEN 800 MG PO TABS: 800.0000 mg | ORAL_TABLET | Freq: Three times a day (TID) | ORAL | 5 refills | Status: DC | PRN

## 2017-02-05 MED ORDER — OXYCODONE-ACETAMINOPHEN 10-325 MG PO TABS: 1.0000 | ORAL_TABLET | ORAL | 0 refills | Status: AC | PRN

## 2017-02-05 NOTE — Addendum Note (Signed)
Addended by: Coral CeoHARPER, Amirr Achord A on: 02/05/2017 12:33 PM   Modules accepted: Orders

## 2017-02-05 NOTE — Progress Notes (Addendum)
Subjective:        [name] is a 56 y.o. female here for a routine exam.  Current complaints: Recent unexplained weight loss.  Has had a significant work up by her PCP and Gastroenterologist which has been negative except for H. Pylori and Hypothyroidism, treated.   She is S/P TAH for fibroids.  Personal health questionnaire:  Is patient [name], have a family history of breast and/or ovarian cancer: yes, breast Is there a family history of uterine cancer diagnosed at age < 2450, gastrointestinal cancer, urinary tract cancer, family member who is a Personnel officerLynch syndrome-associated carrier: no Is the patient overweight and hypertensive, family history of diabetes, personal history of gestational diabetes, preeclampsia or PCOS: no Is patient over 6055, have PCOS,  family history of premature CHD under age 57, diabetes, smoke, have hypertension or peripheral artery disease:  Yes, smokes cigarettes, has HTN At any time, has a partner hit, kicked or otherwise hurt or frightened you?: no Over the past 2 weeks, have you felt down, depressed or hopeless?: yes Over the past 2 weeks, have you felt little interest or pleasure in doing things?:yes   Gynecologic History No LMP recorded. Patient has had a hysterectomy. Contraception: status post hysterectomy Last Pap: n/a. Results were: normal Last mammogram: 2018. Results were: normal  Obstetric History OB History  No data available    Past Medical History:  Diagnosis Date  . Anemia   . Anxiety   . Arthritis   . Bipolar 1 disorder (HCC)   . Bursitis    hx of  . Chronic bronchitis (HCC)   . Complication of anesthesia    difficult to wake up and blood pressure drops  . Depression   . Diverticula of intestine   . Dysrhythmia    no medication treatment  . Eczema   . GERD (gastroesophageal reflux disease)   . H. pylori infection   . Headache(784.0)   . Heart murmur   . Hypertension    sees Dr. Shana ChuteSpruill, is patients primary  .  Hypothyroidism    takes synthroid  . Mental disorder    bipolar  . Pneumonia    hx    Past Surgical History:  Procedure Laterality Date  . ABDOMINAL HYSTERECTOMY     has both of ovaries  . CESAREAN SECTION    . COLONOSCOPY    . LUMBAR LAMINECTOMY  08/22/2011   Procedure: MICRODISCECTOMY LUMBAR LAMINECTOMY;  Surgeon: Dorian HeckleJoseph D Stern, MD;  Location: [facility];  Service: Neurosurgery;  Laterality: Right;  RIGHT Lumbar Five-Sacral One microdiskectomy  . MOUTH SURGERY    . MOUTH SURGERY    . MULTIPLE EXTRACTIONS WITH ALVEOLOPLASTY N/A 08/20/2015   Procedure: Extraction of tooth #'s 1-12,14,20-27, 29 with alveoloplasty;  Surgeon: Charlynne Panderonald F Kulinski, DDS;  [facility];  Service: Oral Surgery;  Laterality: N/A;  . TUBAL LIGATION       Current Outpatient Prescriptions:  .  albuterol (PROVENTIL HFA;VENTOLIN HFA) 108 (90 Base) MCG/ACT inhaler, Inhale 2 puffs into the lungs every 4 (four) hours as needed for wheezing or shortness of breath., Disp: 1 Inhaler, Rfl: 0 .  atenolol (TENORMIN) 50 MG tablet, Take 1 tablet (50 mg total) by mouth daily. Need Dr appt for future refills: For high blood pressure (Patient taking differently: Take 50 mg by mouth daily. ), Disp: 1 tablet, Rfl: 0 .  citalopram (CELEXA) 20 MG tablet, Take 20 mg by mouth daily as needed (depression/anxiety)., Disp: , Rfl:  .  hydrochlorothiazide (MICROZIDE) 12.5 MG capsule, Take 12.5 mg by mouth daily., Disp: , Rfl:  .  levothyroxine (SYNTHROID, LEVOTHROID) 125 MCG tablet, Take 1 tablet (125 mcg total) by mouth daily. For hypothyroidism, Disp: 1 tablet, Rfl: 0 .  neomycin-bacitracin-polymyxin (NEOSPORIN) OINT, Apply 1 application topically 2 (two) times daily as needed (for skin irritation on hands). , Disp: , Rfl:  .  polycarbophil (FIBERCON) 625 MG tablet, Take 1 tablet (625 mg total) by mouth daily as needed (for regularity). Constipation, Disp: , Rfl:  .  polyethylene glycol (MIRALAX / GLYCOLAX) packet, Take 17 g by mouth  daily as needed for mild constipation., Disp: 14 each, Rfl: 0 .  amLODipine (NORVASC) 10 MG tablet, Take 1 tablet (10 mg total) by mouth daily. Take this instead of your LOTREL until you see your regular doctor: For high blood pressure (Patient taking differently: Take 10 mg by mouth daily. ), Disp: 1 tablet, Rfl: 0 .  ibuprofen (ADVIL,MOTRIN) 800 MG tablet, Take 1 tablet (800 mg total) by mouth every 8 (eight) hours as needed., Disp: 30 tablet, Rfl: 5 .  ondansetron (ZOFRAN) 4 MG tablet, Take 1 tablet (4 mg total) by mouth every 6 (six) hours. (Patient not taking: Reported on 02/05/2017), Disp: 12 tablet, Rfl: 0 .  oxyCODONE-acetaminophen (PERCOCET) 10-325 MG tablet, Take 1 tablet by mouth every 4 (four) hours as needed for pain., Disp: 30 tablet, Rfl: 0 Allergies  Allergen Reactions  . Benazepril Anaphylaxis, Hives and Itching  . Penicillins Hives, Itching and Swelling    Face: itching/swelling Has patient had a PCN reaction causing immediate rash, facial/tongue/throat swelling, SOB or lightheadedness with hypotension: Yes Has patient had a PCN reaction causing severe rash involving mucus membranes or skin necrosis: Unk Has patient had a PCN reaction that required hospitalization: No Has patient had a PCN reaction occurring within the last 10 years: No If all of the above answers are "NO", then may proceed with Cephalosporin use.   . Sulfa Antibiotics Hives    Itching and facial swelling  . Ciprofloxacin Hcl Hives    "funny sensation in gums"  . Clindamycin/Lincomycin Hives and Itching    Welts (also)  . Erythromycin Hives and Itching  . Other     Pt has problems with being awaken from anesthesia, Her blood pressure drops very low  . Zithromax [Azithromycin] Hives and Itching  . Sulfamethoxazole Rash  . Tape Itching and Rash    Social History  Substance Use Topics  . Smoking status: Current Every Day Smoker    Packs/day: 0.25    Years: 20.00    Types: Cigarettes  . Smokeless  tobacco: Never Used     Comment: 4-6 cigs/day  . Alcohol use 0.0 oz/week     Comment: occ    Family History  Problem Relation Age of Onset  . Anesthesia problems Mother   . Cancer Mother        thyroid  . Hypertension Mother   . Anesthesia problems Sister   . Hypertension Sister   . Cancer Maternal Aunt   . Cancer Maternal Aunt   . Colon cancer Sister   . Stroke Sister   . Hypertension Brother       Review of Systems  Constitutional: negative for fatigue and weight loss Respiratory: negative for cough and wheezing Cardiovascular: negative for chest pain, fatigue and palpitations Gastrointestinal: negative for abdominal pain and change in bowel habits Musculoskeletal:negative for myalgias Neurological: negative for gait problems and tremors Behavioral/Psych: negative for abusive  relationship, depression Endocrine: negative for temperature intolerance    Genitourinary:negative for abnormal menstrual periods, genital lesions, hot flashes, sexual problems and vaginal discharge Integument/breast: negative for breast lump, breast tenderness, nipple discharge and skin lesion(s)    Objective:       BP 107/72   Pulse 61   Wt 155 lb 12.8 oz (70.7 kg)   BMI 24.40 kg/m  General:   alert  Skin:   no rash or abnormalities  Lungs:   clear to auscultation bilaterally  Heart:   regular rate and rhythm, S1, S2 normal, no murmur, click, rub or gallop  Breasts:   normal without suspicious masses, skin or nipple changes or axillary nodes  Abdomen:  normal findings: no organomegaly, soft, non-tender and no hernia  Pelvis:  External genitalia: normal general appearance Urinary system: urethral meatus normal and bladder without fullness, nontender Vaginal: normal without tenderness, induration or masses Cervix: absent Adnexa: normal bimanual exam Uterus: absent   Lab Review Urine pregnancy test Labs reviewed yes Radiologic studies reviewed yes  50% of 20 min visit spent on  counseling and coordination of care.    Assessment:    Healthy female exam.    Unexplained weight loss, currently being worked up by PCP, etc.  Osteoarthritis    Plan:   Percocet / Ibuprofen Rx for the pain of her arthritis Follow up in 2 years  Education reviewed: calcium supplements, depression evaluation, low fat, low cholesterol diet, self breast exams, smoking cessation and weight bearing exercise. Follow up in: 2 years.   Meds ordered this encounter  Medications  . oxyCODONE-acetaminophen (PERCOCET) 10-325 MG tablet    Sig: Take 1 tablet by mouth every 4 (four) hours as needed for pain.    Dispense:  30 tablet    Refill:  0  . ibuprofen (ADVIL,MOTRIN) 800 MG tablet    Sig: Take 1 tablet (800 mg total) by mouth every 8 (eight) hours as needed.    Dispense:  30 tablet    Refill:  5   No orders of the defined types were placed in this encounter.    Patient ID: STARLIT RABURN, female   DOB: [birth year], 56 y.o.   MRN: [id number]

## 2017-02-06 LAB — CERVICOVAGINAL ANCILLARY ONLY
Bacterial vaginitis: NEGATIVE
Candida vaginitis: NEGATIVE
Chlamydia: NEGATIVE
Neisseria Gonorrhea: NEGATIVE
Trichomonas: NEGATIVE

## 2017-02-13 ENCOUNTER — Telehealth: Payer: Self-pay

## 2017-02-13 ENCOUNTER — Other Ambulatory Visit: Payer: Self-pay

## 2017-02-13 DIAGNOSIS — R52 Pain, unspecified: Secondary | ICD-10-CM

## 2017-02-13 MED ORDER — IBUPROFEN 800 MG PO TABS: 800.0000 mg | ORAL_TABLET | Freq: Three times a day (TID) | ORAL | 3 refills | Status: DC | PRN

## 2017-02-13 NOTE — Telephone Encounter (Signed)
Pt called requesting for a referral to pain management. Please advise

## 2017-02-13 NOTE — Telephone Encounter (Signed)
Pt came into the office wanting a refill on her oxycodone. Pt advised that this could not be refilled, and per Dr. Clearance CootsHarper okay to rx ibuprofen 800 mg. Rx sent to pharmacy

## 2017-02-14 NOTE — Telephone Encounter (Signed)
Referral should be done through her PCP.

## 2017-02-14 NOTE — Telephone Encounter (Signed)
Pt informed

## 2017-02-26 ENCOUNTER — Ambulatory Visit
Admission: RE | Admit: 2017-02-26 | Discharge: 2017-02-26 | Disposition: A | Discharge status: Home / Self Care | Payer: Medicaid Other | Source: Ambulatory Visit | Attending: Internal Medicine | Admitting: Internal Medicine

## 2017-02-26 ENCOUNTER — Other Ambulatory Visit: Payer: Self-pay | Admitting: Internal Medicine

## 2017-02-26 DIAGNOSIS — F17200 Nicotine dependence, unspecified, uncomplicated: Secondary | ICD-10-CM

## 2017-02-26 DIAGNOSIS — R05 Cough: Secondary | ICD-10-CM

## 2017-02-26 DIAGNOSIS — R634 Abnormal weight loss: Secondary | ICD-10-CM

## 2017-02-26 DIAGNOSIS — R053 Chronic cough: Secondary | ICD-10-CM

## 2017-03-05 ENCOUNTER — Ambulatory Visit: Payer: Self-pay | Admitting: Certified Nurse Midwife

## 2017-04-25 ENCOUNTER — Other Ambulatory Visit: Payer: Self-pay | Admitting: Internal Medicine

## 2017-04-25 ENCOUNTER — Ambulatory Visit
Admission: RE | Admit: 2017-04-25 | Discharge: 2017-04-25 | Disposition: A | Discharge status: Home / Self Care | Payer: Medicaid Other | Source: Ambulatory Visit | Attending: Internal Medicine | Admitting: Internal Medicine

## 2017-04-25 DIAGNOSIS — T1490XA Injury, unspecified, initial encounter: Secondary | ICD-10-CM

## 2017-07-28 IMAGING — CR DG HAND COMPLETE 3+V*R*
3 series · 3 of 3 positions shown · non-contrast
Comparison: None.

CLINICAL DATA: 55-year-old female with right hand pain and swelling
at the first metacarpal region.

EXAM:
RIGHT HAND - COMPLETE 3+ VIEW

[x hand pa right]
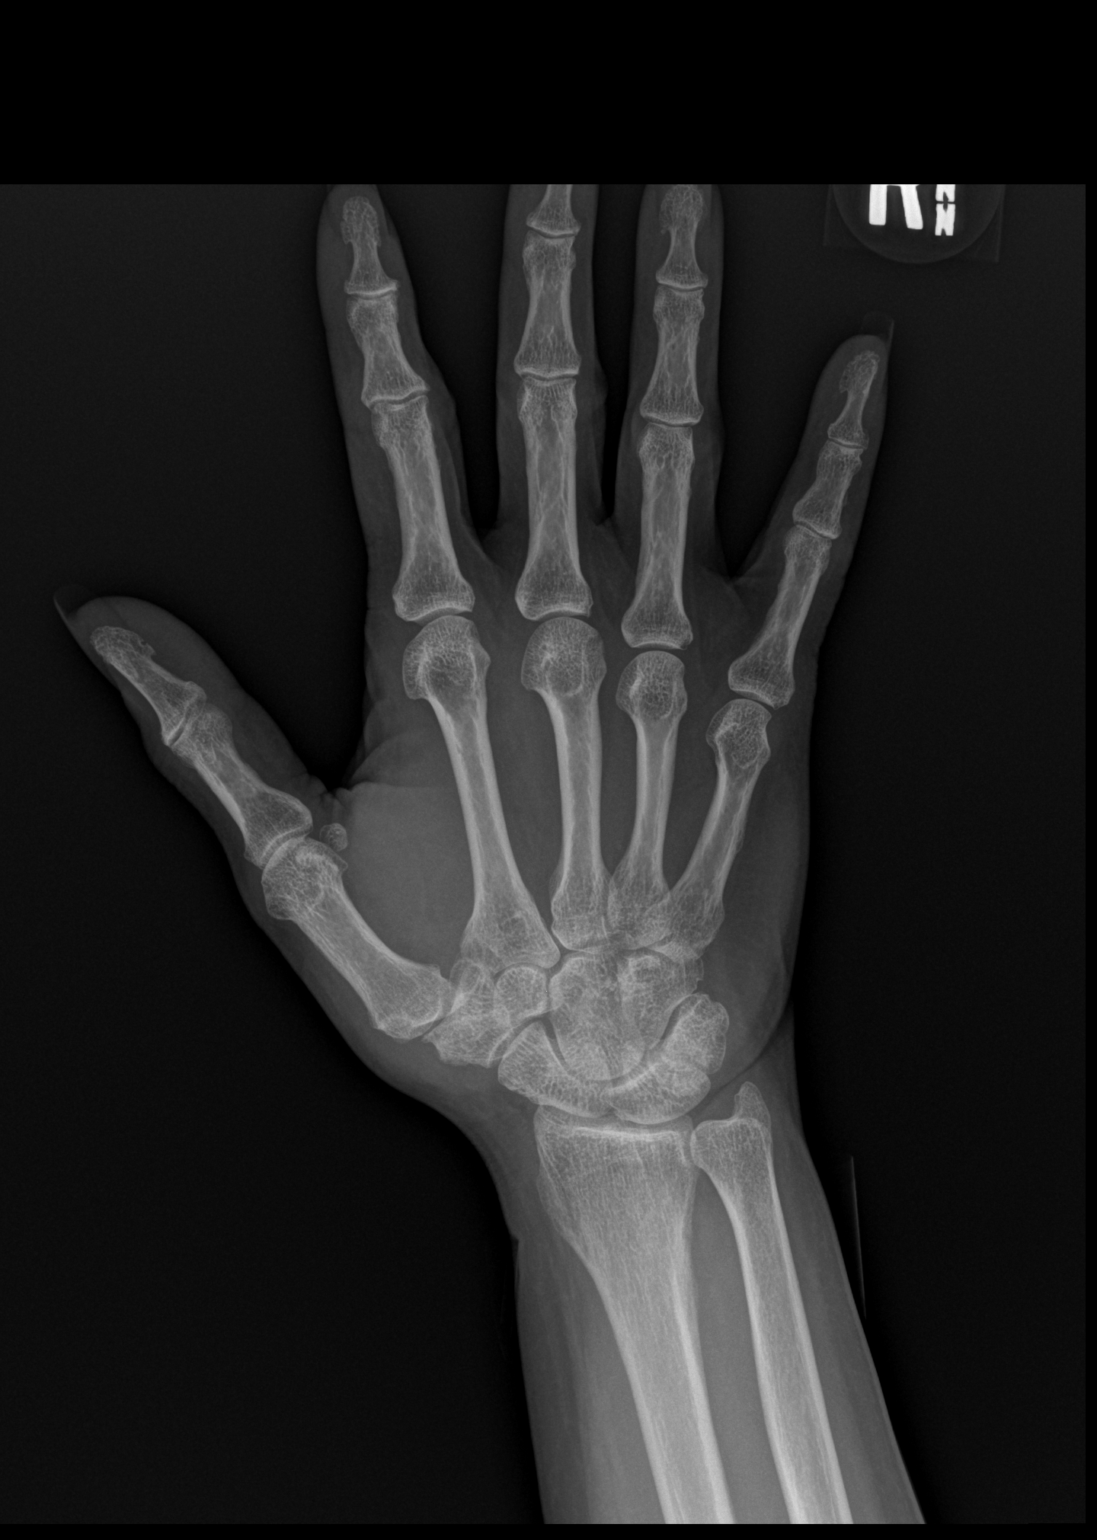

[x hand obl right]
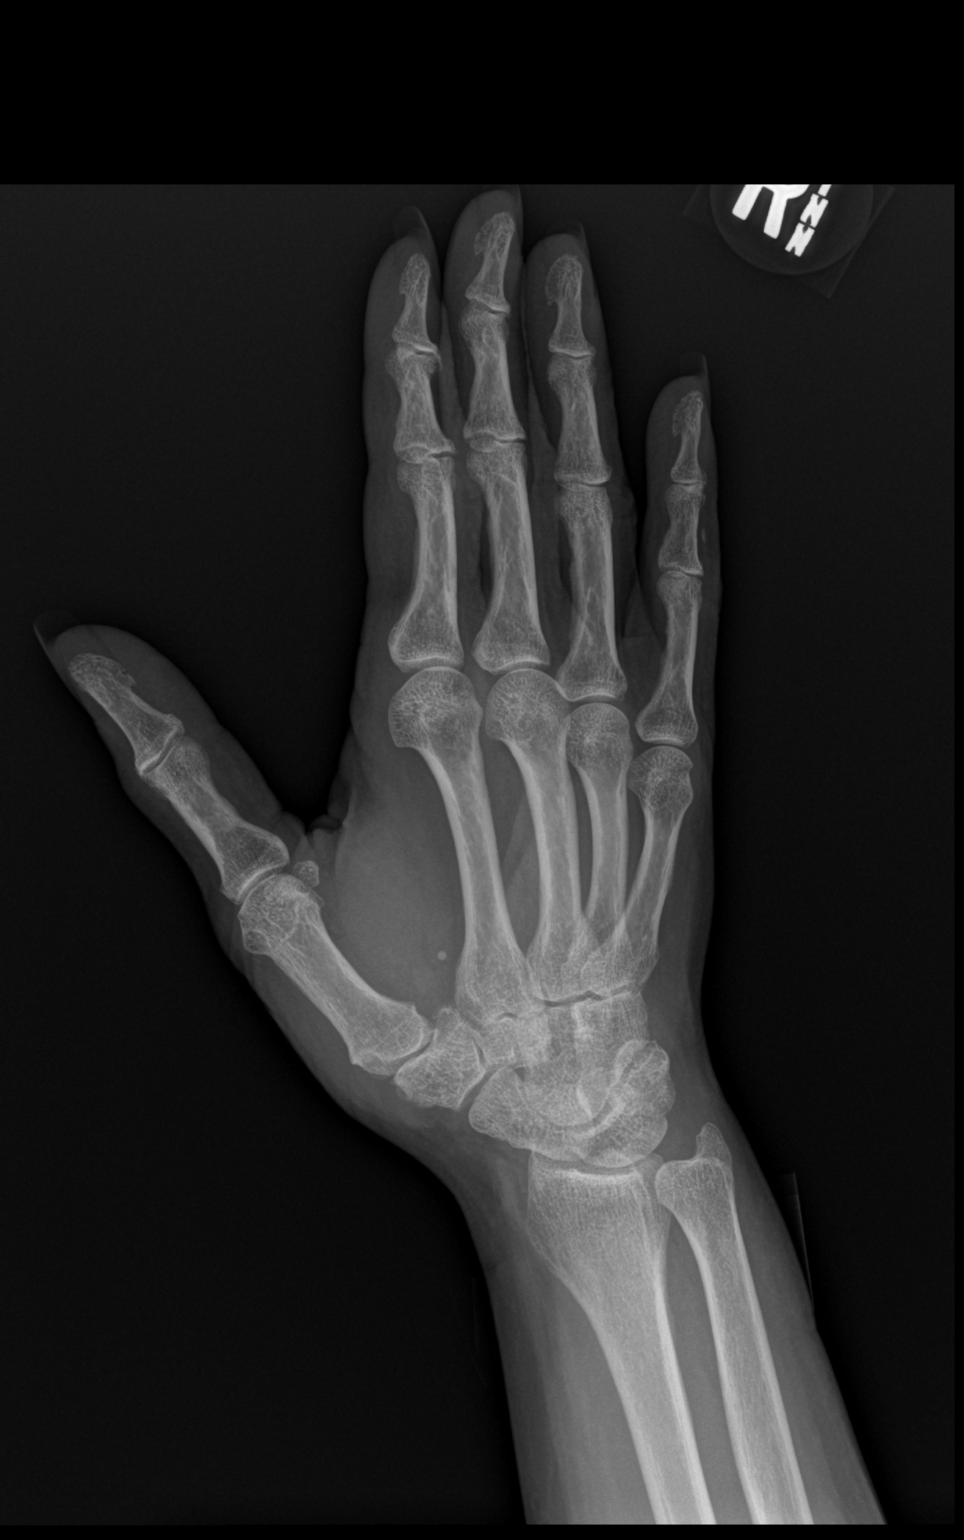

[x hand lat right]
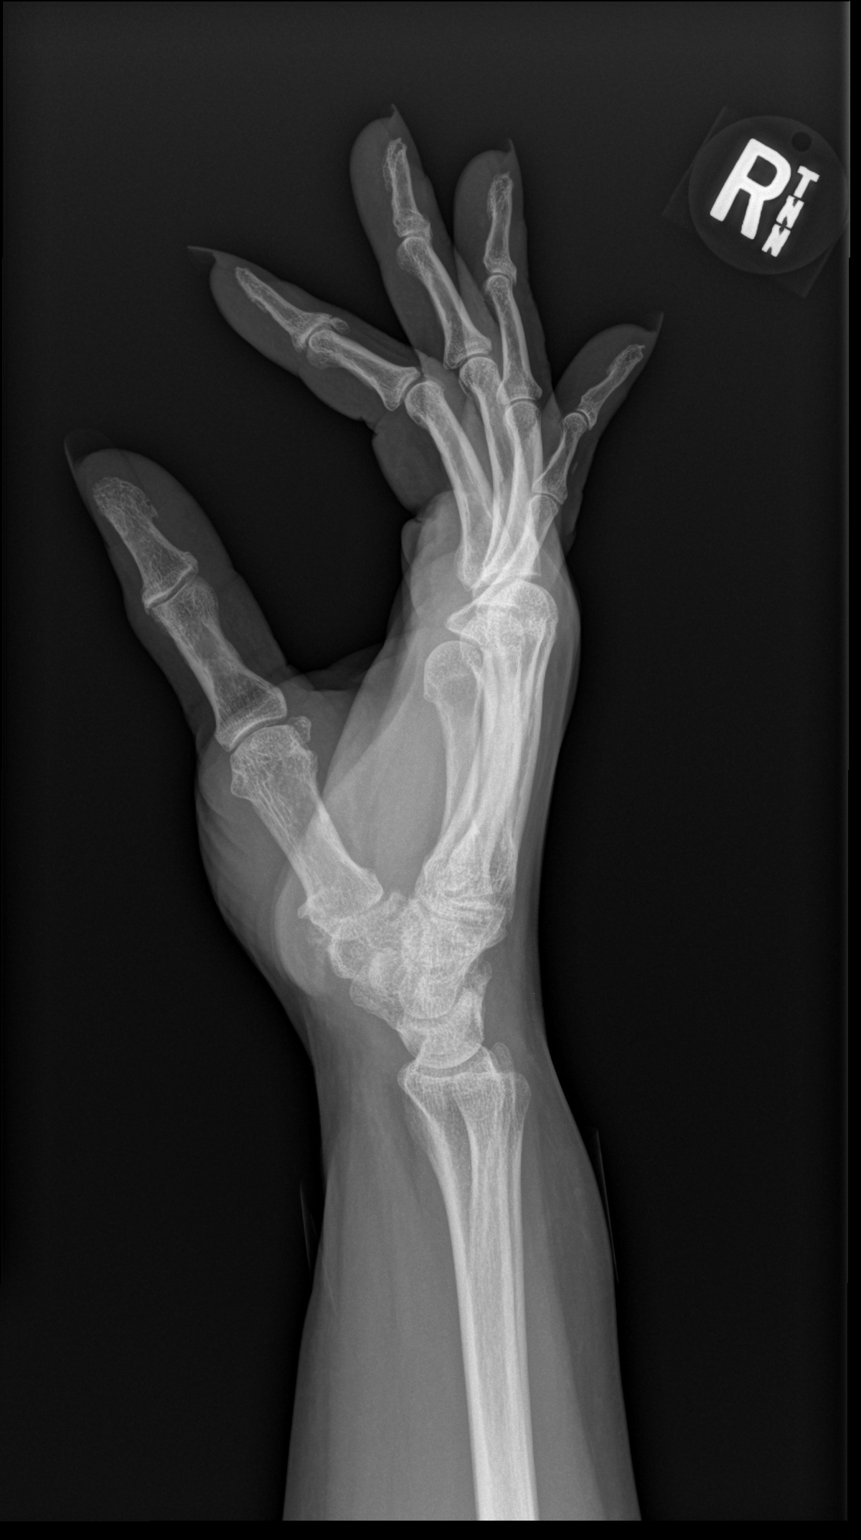

[3 of 3 positions shown; findings below may reference images not displayed]

FINDINGS: There is no acute fracture or dislocation. There is mild osteopenia.
No significant arthritic changes. Soft tissues appear unremarkable.
No radiopaque foreign object.
IMPRESSION: Negative.

## 2017-08-06 ENCOUNTER — Ambulatory Visit: Payer: Medicaid Other | Attending: Orthopedic Surgery | Admitting: Occupational Therapy

## 2017-08-06 DIAGNOSIS — M79645 Pain in left finger(s): Secondary | ICD-10-CM | POA: Diagnosis present

## 2017-08-06 DIAGNOSIS — R600 Localized edema: Secondary | ICD-10-CM

## 2017-08-06 DIAGNOSIS — M6281 Muscle weakness (generalized): Secondary | ICD-10-CM | POA: Insufficient documentation

## 2017-08-06 DIAGNOSIS — M25642 Stiffness of left hand, not elsewhere classified: Secondary | ICD-10-CM | POA: Insufficient documentation

## 2017-08-06 NOTE — Patient Instructions (Signed)
1. Flexor Tendon Gliding (Active Hook Fist)   With fingers and knuckles straight, bend middle and tip joints. Do not bend large knuckles. "Hook" Repeat _15___ times. Do _6___ sessions per day.     2. PIP / DIP Composite Flexion (Passive Stretch)    Use other hand to bend middle and tip joints of __RING____ finger. Hold __5__ seconds. Then remove other hand and try and hold the NEW position and hold 5 more sec. Repeat __5__ times. Do _6___ sessions per day.   3. Pen rolling exercise from fingertips to palm, and palm back to fingertips x 10. Keep ring finger on pen at all times. Do 6 sessions per day.

## 2017-08-06 NOTE — Therapy (Signed)
[facility] [address] [place], [place], [address] Phone: [phone]   Fax:  [phone]  Occupational Therapy Evaluation  Patient Details  Name: [name] MRN: [id number] Date of Birth: [birth year] Referring Provider: Dr. Lunette Stands   Encounter Date: 08/06/2017  OT End of Session - 08/06/17 1210    Visit Number  1    Number of Visits  4    Authorization Type  MCD - awaiting approval    OT Start Time  1100    OT Stop Time  1145    OT Time Calculation (min)  45 min    Activity Tolerance  Patient tolerated treatment well    Behavior During Therapy  [facility] for tasks assessed/performed       Past Medical History:  Diagnosis Date  . Anemia   . Anxiety   . Arthritis   . Bipolar 1 disorder (HCC)   . Bursitis    hx of  . Chronic bronchitis (HCC)   . Complication of anesthesia    difficult to wake up and blood pressure drops  . Depression   . Diverticula of intestine   . Dysrhythmia    no medication treatment  . Eczema   . GERD (gastroesophageal reflux disease)   . H. pylori infection   . Headache(784.0)   . Heart murmur   . Hypertension    sees Dr. Shana Chute, is patients primary  . Hypothyroidism    takes synthroid  . Mental disorder    bipolar  . Pneumonia    hx    Past Surgical History:  Procedure Laterality Date  . ABDOMINAL HYSTERECTOMY     has both of ovaries  . CESAREAN SECTION    . COLONOSCOPY    . LUMBAR LAMINECTOMY  08/22/2011   Procedure: MICRODISCECTOMY LUMBAR LAMINECTOMY;  Surgeon: Dorian Heckle, MD;  Location: [facility];  Service: Neurosurgery;  Laterality: Right;  RIGHT Lumbar Five-Sacral One microdiskectomy  . MOUTH SURGERY    . MOUTH SURGERY    . MULTIPLE EXTRACTIONS WITH ALVEOLOPLASTY N/A 08/20/2015   Procedure: Extraction of tooth #'s 1-12,14,20-27, 29 with alveoloplasty;  Surgeon: Charlynne Pander, DDS;  Location: [facility] OR;  Service: Oral Surgery;  Laterality: N/A;  . TUBAL  LIGATION      There were no vitals filed for this visit.  Subjective Assessment - 08/06/17 1108    Patient is accompained by:  Family member    Pertinent History  OA, HTN, Bipolar, anxiety    Patient Stated Goals  Be able to use my hand without pain    Currently in Pain?  Yes    Pain Score  4  up to 10/10    Pain Location  -- RING finger   Pain Orientation  Left    Pain Descriptors / Indicators  Throbbing;Aching    Pain Onset  More than a month ago    Pain Frequency  Constant    Aggravating Factors   moving it, hot water, cold packs    Pain Relieving Factors  elevation        [facility] OT Assessment - 08/06/17 0001      Assessment   Diagnosis  s/p middle phalanx fx Lt 4th finger with persistent swelling and stiffness (no surgery)    Referring Provider  Dr. Lunette Stands    Onset Date  -- 04/2017    Assessment  Pt did not have surgery and no splinting, but reports the fracture in now  healed.      Precautions   Precautions  None      Balance Screen   Has the patient fallen in the past 6 months  No    Has the patient had a decrease in activity level because of a fear of falling?   No    Is the patient reluctant to leave their home because of a fear of falling?   No      Home  Environment   Lives With  Family son and friend      Prior Function   Level of Independence  Independent    Vocation  On disability    Leisure  board games, ipad      ADL   Eating/Feeding  Modified independent    Grooming  Modified independent    Engineer, drillingUpper Body Bathing  Independent    Lower Body Bathing  Independent    Upper Body Dressing  Independent    Lower Body Dressing  Independent    Toilet Transfer  Independent    Toileting - Recruitment consultantClothing Manipulation  Independent    Toileting -  Geneticist, molecularHygiene  Independent    Tub/Shower Transfer  Independent      IADL   Shopping  Needs to be accompanied on any shopping trip    Light Housekeeping  Performs light daily tasks such as dishwashing, bed making    Meal Prep   Plans, prepares and serves adequate meals independently    Data processing managerCommunity Mobility  Relies on family or friends for transportation    Medication Management  Is responsible for taking medication in correct dosages at correct time    Physicist, medicalinancial Management  Manages financial matters independently (budgets, writes checks, pays rent, bills goes to bank), collects and keeps track of income      Mobility   Mobility Status  Independent      Written Expression   Dominant Hand  Right    Handwriting  -- no changes      Sensation   Additional Comments  Pt reports numbness in Lt finger      Coordination   Coordination  WFL's      Edema   Edema  moderate Lt ring PIP joint      ROM / Strength   AROM / PROM / Strength  AROM      AROM   Overall AROM Comments  BUE AROM WNL's except Lt ring finger PIP and DIP joint - see below      Left Hand AROM   L Ring  MCP 0-90  -- WNL's    L Ring PIP 0-100  85 Degrees -5 to -10 but mostly d/t swelling    L Ring DIP 0-70  45 Degrees      Hand Function   Right Hand Grip (lbs)  53    Left Hand Grip (lbs)  40               OT Treatments/Exercises (OP) - 08/06/17 0001      ADLs   ADL Comments  Pt educated in edema management through finger gel compression sleeve and where to purchase. Pt instructed to wear at night      Exercises   Exercises  -- SEE PT INSTRUCTIONS            OT Education - 08/06/17 1145    Education provided  Yes    Education Details  A/ROM and P/ROM HEP    Person(s) Educated  Patient;Other (comment) FRIEND  FRIEND   Methods  Explanation;Demonstration;Handout    Comprehension  Verbalized understanding;Verbal cues required          OT Long Term Goals - 08/06/17 1214      OT LONG TERM GOAL #1   Title  Independent with HEP     Baseline  issued initial HEP, will need updates for strengthening    Time  4    Period  Weeks    Status  New      OT LONG TERM GOAL #2   Title  Pt to verbalize understanding with pain  management and edema management strategies    Baseline  dependent    Time  4    Period  Weeks    Status  New      OT LONG TERM GOAL #3   Title  Pt to improve Lt ring PIP flexion to 92 degrees or greater    Baseline  85*    Time  4    Period  Weeks    Status  New      OT LONG TERM GOAL #4   Title  Lt grip strength to be 48 lbs or greater to assist in opening jars/containers    Baseline  40 lbs    Time  4    Period  Weeks    Status  New            Plan - 08/06/17 1211    Clinical Impression Statement  Pt is a 57 y.o. female who presents to outpatient rehab s/p Lt ring middle phalanx fracture in August 2018. Pt reports fracture has healed now, but did not have surgery or splinting. Pt presents today with localized swelling, pain, and stiffness of Lt ring PIP joint and decr. strength Lt hand.     Occupational Profile and client history currently impacting functional performance  Current pain and edema limiting functional use of Lt non dominant hand    Occupational performance deficits (Please refer to evaluation for details):  ADL's;IADL's    Rehab Potential  Good    OT Frequency  -- 3 visits through 08/31/17 (1x/wk for 3 weeks)     OT Treatment/Interventions  Self-care/ADL training;Moist Heat;Fluidtherapy;DME and/or AE instruction;Splinting;Contrast Bath;Therapeutic activities;Therapeutic exercise;Scar mobilization;Passive range of motion;Cryotherapy;Electrical Stimulation;Paraffin;Manual Therapy    Plan  review HEP, issue putty HEP, Consider fluidotherapy    Clinical Decision Making  Limited treatment options, no task modification necessary    Consulted and Agree with Plan of Care  Patient;Family member/caregiver    Family Member Consulted  Friend       Patient will benefit from skilled therapeutic intervention in order to improve the following deficits and impairments:  Decreased range of motion, Impaired flexibility, Increased edema, Impaired sensation, Impaired UE functional  use, Pain, Decreased strength  Visit Diagnosis: Stiffness of left hand, not elsewhere classified - Plan: Ot plan of care cert/re-cert  Pain in left finger(s) - Plan: Ot plan of care cert/re-cert  Muscle weakness (generalized) - Plan: Ot plan of care cert/re-cert  Localized edema - Plan: Ot plan of care cert/re-cert    Problem List Patient Active Problem List   Diagnosis Date Noted  . Bipolar disorder, curr episode mixed, severe, w/o psychotic features (HCC) 04/03/2016  . Alcohol use disorder, severe, dependence (HCC) 03/30/2016  . Alcohol abuse 01/09/2016  . 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 Diverticulitis 08/15/2015  . Diverticulitis of large intestine with abscess  11/23/2014  . Acute diverticulitis 11/23/2014  . Leukocytosis 11/17/2014  .  Essential hypertension 11/17/2014  . Heart murmur   . Chronic bronchitis (HCC)   . Hypothyroidism   . Anxiety   . Depression   . Mental disorder   . GERD (gastroesophageal reflux disease)   . Simple chronic bronchitis (HCC)     Kelli ChurnBallie, Dorothymae Maciver Johnson, OTR/L 08/06/2017, 12:19 PM  [facility] [address] [place], [place], [address] Phone: [phone]   Fax:  [phone]  Name: [name] MRN: [id number] Date of Birth: 10/21/1960

## 2017-08-13 ENCOUNTER — Encounter (HOSPITAL_COMMUNITY): Payer: Self-pay | Admitting: Dentistry

## 2017-08-21 ENCOUNTER — Ambulatory Visit: Payer: Medicaid Other | Attending: Orthopedic Surgery | Admitting: Occupational Therapy

## 2017-08-21 DIAGNOSIS — M25642 Stiffness of left hand, not elsewhere classified: Secondary | ICD-10-CM | POA: Insufficient documentation

## 2017-08-21 DIAGNOSIS — M79645 Pain in left finger(s): Secondary | ICD-10-CM

## 2017-08-21 DIAGNOSIS — R600 Localized edema: Secondary | ICD-10-CM

## 2017-08-21 DIAGNOSIS — M6281 Muscle weakness (generalized): Secondary | ICD-10-CM | POA: Insufficient documentation

## 2017-08-21 NOTE — Therapy (Signed)
[facility] [address] [place], [place], [address] Phone: [phone]   Fax:  [phone]  Occupational Therapy Treatment  Patient Details  Name: [name] MRN: [id number] Date of Birth: 1961/04/20 Referring Provider (Historical): Dr. Almedia Balls   Encounter Date: 08/21/2017  OT End of Session - 08/21/17 0832    Visit Number  2    Number of Visits  4    Authorization Type  MCD - approved 3 visits     Authorization Time Period  12/10 - 08/26/17    Authorization - Visit Number  1    Authorization - Number of Visits  3    OT Start Time  0800    OT Stop Time  0840    OT Time Calculation (min)  40 min    Activity Tolerance  Patient tolerated treatment well    Behavior During Therapy  [facility] for tasks assessed/performed       Past Medical History:  Diagnosis Date  . Anemia   . Anxiety   . Arthritis   . Bipolar 1 disorder ([place])   . Bursitis    hx of  . Chronic bronchitis ([place])   . Complication of anesthesia    difficult to wake up and blood pressure drops  . Depression   . Diverticula of intestine   . Dysrhythmia    no medication treatment  . Eczema   . GERD (gastroesophageal reflux disease)   . H. pylori infection   . Headache(784.0)   . Heart murmur   . Hypertension    sees Dr. Montez Morita, is patients primary  . Hypothyroidism    takes synthroid  . Mental disorder    bipolar  . Pneumonia    hx    Past Surgical History:  Procedure Laterality Date  . ABDOMINAL HYSTERECTOMY     has both of ovaries  . CESAREAN SECTION    . COLONOSCOPY    . LUMBAR LAMINECTOMY  08/22/2011   Procedure: MICRODISCECTOMY LUMBAR LAMINECTOMY;  Surgeon: Peggyann Shoals, MD;  Location: [facility];  Service: Neurosurgery;  Laterality: Right;  RIGHT Lumbar Five-Sacral One microdiskectomy  . MOUTH SURGERY    . MOUTH SURGERY    . MULTIPLE EXTRACTIONS WITH ALVEOLOPLASTY N/A 08/20/2015   Procedure: Extraction of tooth #'s  1-12,14,20-27, 29 with alveoloplasty;  Surgeon: Lenn Cal, DDS;  Location: [facility];  Service: Oral Surgery;  Laterality: N/A;  . TUBAL LIGATION      There were no vitals filed for this visit.  Subjective Assessment - 08/21/17 0801    Patient is accompained by:  Family member    Pertinent History  OA, HTN, Bipolar, anxiety    Patient Stated Goals  Be able to use my hand without pain    Currently in Pain?  Yes    Pain Score  7     Pain Location  -- ring finger    Pain Orientation  Left    Pain Descriptors / Indicators  Aching    Pain Type  Acute pain    Pain Onset  More than a month ago    Pain Frequency  Intermittent    Aggravating Factors   cold weather    Pain Relieving Factors  elevation         [facility] OT Assessment - 08/21/17 0001      Left Hand AROM   L Ring PIP 0-100  90 Degrees    L Ring DIP 0-70  55 Degrees      Hand Function   Right Hand Grip (lbs)  70 lbs    Left Hand Grip (lbs)  60 lbs               OT Treatments/Exercises (OP) - 08/21/17 0001      ADLs   ADL Comments  Assessed progress towards goals including updates in ROM and grip strength Lt hand      Exercises   Exercises  Hand      Hand Exercises   Other Hand Exercises  Reviewed A/ROM and P/ROM HEP for Lt hand and Lt ring finger - pt demo each x 10 reps    Other Hand Exercises  Pt issued strengthening HEP with green resistance putty for Lt hand. Issued putty for HEP - see pt instructions for details      Modalities   Modalities  Fluidotherapy      LUE Fluidotherapy   Number Minutes Fluidotherapy  10 Minutes    LUE Fluidotherapy Location  Hand    Comments  for pain management and stiffness             OT Education - 08/21/17 0829    Education provided  Yes    Education Details  Putty HEP    Person(s) Educated  Patient Friend   Friend   Methods  Explanation;Demonstration;Handout    Comprehension  Verbalized understanding;Returned demonstration          OT Long  Term Goals - 08/21/17 0834      OT LONG TERM GOAL #1   Title  Independent with HEP     Baseline  issued initial HEP, will need updates for strengthening    Time  4    Period  Weeks    Status  Achieved      OT LONG TERM GOAL #2   Title  Pt to verbalize understanding with pain management and edema management strategies    Baseline  dependent    Time  4    Period  Weeks    Status  Achieved      OT LONG TERM GOAL #3   Title  Pt to improve Lt ring PIP flexion to 92 degrees or greater    Baseline  85*    Time  4    Period  Weeks    Status  On-going 08/21/17: 90 degrees      OT LONG TERM GOAL #4   Title  Lt grip strength to be 48 lbs or greater to assist in opening jars/containers    Baseline  40 lbs    Time  4    Period  Weeks    Status  Achieved 08/21/17: 60 lbs      OT LONG TERM GOAL #5   Title  Lt grip strength to be 65 lbs or greater to assist in opening tight jars    Baseline  Eval = 40 lbs, 08/21/17 = 60 lbs    Time  4    Period  Weeks    Status  New            Plan - 08/21/17 3300    Clinical Impression Statement  Pt has met LTG's #1, #2. Pt approximating LTG #3. Pt met original LTG #4, therefore updated grip strength for a new goal (LTG #5). Pt has improved in ROM Lt ring finger and grip strength Lt hand. Pt needs 2-3 more visits to meet remaining goals.  (pt was approved  3 visits initially with requested frequency at evaluation 1x/wk for 3 weeks, but Medicaid only approved 2 weeks)   Occupational performance deficits (Please refer to evaluation for details):  ADL's;IADL's    Rehab Potential  Good    OT Frequency  1x / week    OT Duration  2 weeks Beginning January 2019    OT Treatment/Interventions  Self-care/ADL training;Moist Heat;Fluidtherapy;DME and/or AE instruction;Splinting;Contrast Bath;Therapeutic activities;Therapeutic exercise;Scar mobilization;Passive range of motion;Cryotherapy;Electrical Stimulation;Paraffin;Manual Therapy    Plan  continue  fluidotherapy, continue ROM Lt ring finger and strengthening, try gripper activity     Consulted and Agree with Plan of Care  Patient;Family member/caregiver    Family Member Consulted  Friend       Patient will benefit from skilled therapeutic intervention in order to improve the following deficits and impairments:  Decreased range of motion, Impaired flexibility, Increased edema, Impaired sensation, Impaired UE functional use, Pain, Decreased strength  Visit Diagnosis: Stiffness of left hand, not elsewhere classified  Pain in left finger(s)  Muscle weakness (generalized)  Localized edema    Problem List Patient Active Problem List   Diagnosis Date Noted  . Bipolar disorder, curr episode mixed, severe, w/o psychotic features ([place]) 04/03/2016  . Alcohol use disorder, severe, dependence ([place]) 03/30/2016  . Alcohol abuse 01/09/2016  . Alcohol-induced mood disorder ([place]) 01/09/2016  . Diverticulitis 08/15/2015  . Diverticulitis of large intestine with abscess  11/23/2014  . Acute diverticulitis 11/23/2014  . Leukocytosis 11/17/2014  . Essential hypertension 11/17/2014  . Heart murmur   . Chronic bronchitis ([place])   . Hypothyroidism   . Anxiety   . Depression   . Mental disorder   . GERD (gastroesophageal reflux disease)   . Simple chronic bronchitis ([place])     Carey Bullocks, OTR/L 08/21/2017, 8:49 AM  [facility] [address] [place], [place], [address] Phone: [phone]   Fax:  [phone]  Name: [name] MRN: [id number] Date of Birth: 11-27-1960

## 2017-08-21 NOTE — Patient Instructions (Signed)
  1. Grip Strengthening (Resistive Putty)   Squeeze putty using thumb and all fingers. Repeat _15___ times. Do __2__ sessions per day.   2. IP Fisting (Resistive Putty)    Keeping big knuckles straight, bend fingertips to squeeze putty. Repeat _10___ times. Do _2___ sessions per day.

## 2017-08-30 ENCOUNTER — Encounter: Payer: Self-pay | Admitting: Occupational Therapy

## 2017-09-10 ENCOUNTER — Encounter: Payer: Self-pay | Admitting: Occupational Therapy

## 2017-09-11 ENCOUNTER — Ambulatory Visit (HOSPITAL_COMMUNITY): Payer: Medicaid - Dental | Admitting: Dentistry

## 2017-09-11 ENCOUNTER — Encounter (HOSPITAL_COMMUNITY): Payer: Self-pay | Admitting: Dentistry

## 2017-09-11 ENCOUNTER — Encounter (INDEPENDENT_AMBULATORY_CARE_PROVIDER_SITE_OTHER): Payer: Self-pay

## 2017-09-11 VITALS — BP 115/56 | HR 57 | Temp 98.2°F

## 2017-09-11 DIAGNOSIS — Z972 Presence of dental prosthetic device (complete) (partial): Secondary | ICD-10-CM

## 2017-09-11 DIAGNOSIS — Z463 Encounter for fitting and adjustment of dental prosthetic device: Secondary | ICD-10-CM

## 2017-09-11 DIAGNOSIS — K08109 Complete loss of teeth, unspecified cause, unspecified class: Secondary | ICD-10-CM

## 2017-09-11 DIAGNOSIS — K0820 Unspecified atrophy of edentulous alveolar ridge: Secondary | ICD-10-CM

## 2017-09-11 DIAGNOSIS — R220 Localized swelling, mass and lump, head: Secondary | ICD-10-CM

## 2017-09-11 NOTE — Patient Instructions (Signed)
Plan/recommendations: 1. Keep dentures out if sore spots arise. 2. Use salt water rinses as needed to aid healing. 3. Return to clinic as scheduled or call if problems arise before then.  Dr. Kristin BruinsKulinski

## 2017-09-11 NOTE — Progress Notes (Signed)
09/11/2017  Patient Name:   [name] Date of Birth:   06/29/1961 Medical Record Number: [id number]   BP (!) 115/56 (BP Location: Left Arm)   Pulse (!) 57   Temp 98.2 F (36.8 C) (Oral)   [name] is a 57 year old female that presents for periodic oral exam and evaluation of upper and lower complete dentures. Patient was initially seen in December 2016 for evaluation of facial swelling. Patient had full mouth extractions on 08/20/2015 in the operating room. Upper and lower complete dentures were then fabricated and inserted on 11/02/2015. She has been seen for multiple denture adjustment appointments since then.  Patient now presents for periodic oral examination and evaluation of upper and lower complete dentures.  Patient Active Problem List   Diagnosis Date Noted  . Bipolar disorder, curr episode mixed, severe, w/o psychotic features ([facility]) 04/03/2016  . Alcohol use disorder, severe, dependence (H[facility]) 03/30/2016  . Alcohol abuse 01/09/2016  . 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 Diverticulitis 08/15/2015  . Diverticulitis of large intestine with abscess  11/23/2014  . Acute diverticulitis 11/23/2014  . Leukocytosis 11/17/2014  . Essential hypertension 11/17/2014  . Heart murmur   . Chronic bronchitis ([facility])   . Hypothyroidism   . Anxiety   . Depression   . Mental disorder   . GERD (gastroesophageal reflux disease)   . Simple chronic bronchitis ([facility])      Medical Hx Update:  Past Medical History:  Diagnosis Date  . Anemia   . Anxiety   . Arthritis   . Bipolar 1 disorder ([facility])   . Bursitis    hx of  . Chronic bronchitis ([facility])   . Complication of anesthesia    difficult to wake up and blood pressure drops  . Depression   . Diverticula of intestine   . Dysrhythmia    no medication treatment  . Eczema   . GERD (gastroesophageal reflux disease)   . H. pylori infection   . Headache(784.0)   . Heart murmur   . Hypertension    sees Dr. Shana Chute, is  patients primary  . Hypothyroidism    takes synthroid  . Mental disorder    bipolar  . Pneumonia    hx  .  Past Surgical History:  Procedure Laterality Date  . ABDOMINAL HYSTERECTOMY     has both of ovaries  . CESAREAN SECTION    . COLONOSCOPY    . LUMBAR LAMINECTOMY  08/22/2011   Procedure: MICRODISCECTOMY LUMBAR LAMINECTOMY;  Surgeon: Dorian Heckle, MD;  Location: [facility];  Service: Neurosurgery;  Laterality: Right;  RIGHT Lumbar Five-Sacral One microdiskectomy  . MOUTH SURGERY    . MOUTH SURGERY    . MULTIPLE EXTRACTIONS WITH ALVEOLOPLASTY N/A 08/20/2015   Procedure: Extraction of tooth #'s 1-12,14,20-27, 29 with alveoloplasty;  Surgeon: Charlynne Pander, DDS;  Location: [facility];  Service: Oral Surgery;  Laterality: N/A;  . TUBAL LIGATION      ALLERGIES/ADVERSE DRUG REACTIONS: Allergies  Allergen Reactions  . Benazepril Anaphylaxis, Hives and Itching  . Penicillins Hives, Itching and Swelling    Face: itching/swelling Has patient had a PCN reaction causing immediate rash, facial/tongue/throat swelling, SOB or lightheadedness with hypotension: Yes Has patient had a PCN reaction causing severe rash involving mucus membranes or skin necrosis: Unk Has patient had a PCN reaction that required hospitalization: No Has patient had a PCN reaction occurring within the last 10 years: No If all of the above answers are "  NO", then may proceed with Cephalosporin use.   . Sulfa Antibiotics Hives    Itching and facial swelling  . Ciprofloxacin Hcl Hives    "funny sensation in gums"  . Clindamycin/Lincomycin Hives and Itching    Welts (also)  . Erythromycin Hives and Itching  . Other     Pt has problems with being awaken from anesthesia, Her blood pressure drops very low  . Zithromax [Azithromycin] Hives and Itching  . Sulfamethoxazole Rash  . Tape Itching and Rash    MEDICATIONS: Current Outpatient Medications  Medication Sig Dispense Refill  . albuterol (PROVENTIL  HFA;VENTOLIN HFA) 108 (90 Base) MCG/ACT inhaler Inhale 2 puffs into the lungs every 4 (four) hours as needed for wheezing or shortness of breath. 1 Inhaler 0  . amLODipine (NORVASC) 10 MG tablet Take 1 tablet (10 mg total) by mouth daily. Take this instead of your LOTREL until you see your regular doctor: For high blood pressure (Patient taking differently: Take 10 mg by mouth daily. ) 1 tablet 0  . atenolol (TENORMIN) 50 MG tablet Take 1 tablet (50 mg total) by mouth daily. Need Dr appt for future refills: For high blood pressure (Patient taking differently: Take 50 mg by mouth daily. ) 1 tablet 0  . citalopram (CELEXA) 20 MG tablet Take 20 mg by mouth daily as needed (depression/anxiety).    . hydrochlorothiazide (MICROZIDE) 12.5 MG capsule Take 12.5 mg by mouth daily.    Marland Kitchen ibuprofen (ADVIL,MOTRIN) 800 MG tablet Take 1 tablet (800 mg total) by mouth every 8 (eight) hours as needed. (Patient not taking: Reported on 08/06/2017) 30 tablet 5  . ibuprofen (ADVIL,MOTRIN) 800 MG tablet Take 1 tablet (800 mg total) by mouth 3 (three) times daily with meals as needed for headache or moderate pain. (Patient not taking: Reported on 08/06/2017) 30 tablet 3  . levothyroxine (SYNTHROID, LEVOTHROID) 125 MCG tablet Take 1 tablet (125 mcg total) by mouth daily. For hypothyroidism 1 tablet 0  . neomycin-bacitracin-polymyxin (NEOSPORIN) OINT Apply 1 application topically 2 (two) times daily as needed (for skin irritation on hands).     . ondansetron (ZOFRAN) 4 MG tablet Take 1 tablet (4 mg total) by mouth every 6 (six) hours. (Patient not taking: Reported on 02/05/2017) 12 tablet 0  . oxyCODONE-acetaminophen (PERCOCET) 10-325 MG tablet Take 1 tablet by mouth every 4 (four) hours as needed for pain. (Patient not taking: Reported on 08/06/2017) 30 tablet 0  . polycarbophil (FIBERCON) 625 MG tablet Take 1 tablet (625 mg total) by mouth daily as needed (for regularity). Constipation    . polyethylene glycol (MIRALAX / GLYCOLAX)  packet Take 17 g by mouth daily as needed for mild constipation. 14 each 0   No current facility-administered medications for this visit.     C/C: Patient is complaining of some minor denture irritation.   HPI:  GENEVIVE PRINTUP is a 57 year old female that presents for periodic oral exam and evaluation of upper and lower complete dentures. Patient was initially seen in December 2016 for evaluation of facial swelling. Patient had full mouth extractions on 08/20/2015 in the operating room. Upper and lower complete dentures were then fabricated and inserted on 11/02/2015. She has been seen for multiple denture adjustment appointments since then.  Patient now presents for oral examination and evaluation of upper and lower complete dentures.   DENTAL EXAM: General: Patient is a well-developed, well-nourished female in no acute distress. Vitals: BP (!) 115/56 (BP Location: Left Arm)   Pulse Marland Kitchen)  57   Temp 98.2 F (36.8 C) (Oral)  Extraoral Exam: There is no neck lymphadenopathy palpated. The patient denies acute TMJ symptoms. Intraoral  Exam: Patient has normal saliva. There is no evidence of denture irritation or erythema. Dentition: Patient is edentulous. There is atrophy of the lower edentulous alveolar ridge. Prosthodontic: Patient has an upper lower complete dentures that are stable and retentive. Pressure indicating paste was applied to the upper and lower dentures. Significant denture adjustments were made on the intaglio surface of the upper and lower complete dentures. Thick PIP applied to lower denture borders. Adjustments made as needed. Dentures were then polished. Occlusion:  Occlusion was evaluated and is acceptable in centric relation and protrusive strokes. Patient with tendency to protrude lower jaw in an end to end position.  Patient was again instructed on finding maximum intercuspation position. Patient expressed understanding of why she needs to find the maximum intercuspation  position to prevent instability of the dentures during occlusion.  Assessments: 1. Patient is edentulous 2. There is atrophy of the edentulous alveolar ridges. 3. Upper and lower complete dentures are stable and retentive.  Plan/recommendations: 1. Keep dentures out if sore spots arise. 2. Use salt water rinses as needed to aid healing. 3. Return to clinic as scheduled or call if problems arise before then.   Charlynne Panderonald F. Skylur Fuston, DDS

## 2017-09-13 ENCOUNTER — Ambulatory Visit: Payer: Medicaid Other | Attending: Orthopedic Surgery | Admitting: Occupational Therapy

## 2017-09-13 ENCOUNTER — Encounter: Payer: Self-pay | Admitting: Occupational Therapy

## 2017-09-13 DIAGNOSIS — M6281 Muscle weakness (generalized): Secondary | ICD-10-CM | POA: Diagnosis present

## 2017-09-13 DIAGNOSIS — M25642 Stiffness of left hand, not elsewhere classified: Secondary | ICD-10-CM | POA: Insufficient documentation

## 2017-09-13 DIAGNOSIS — M79645 Pain in left finger(s): Secondary | ICD-10-CM

## 2017-09-13 DIAGNOSIS — R600 Localized edema: Secondary | ICD-10-CM | POA: Insufficient documentation

## 2017-09-13 NOTE — Therapy (Signed)
[facility] [address] [place], [place], [address] Phone: [phone]   Fax:  [phone]  Occupational Therapy Treatment  Patient Details  Name: [name] MRN: [id number] Date of Birth: 1961/08/17 No Data Recorded  Encounter Date: 09/13/2017  OT End of Session - 09/13/17 0942    Visit Number  3    Number of Visits  4    Authorization Type  MCD - approved 3 visits     Authorization Time Period  12/10 - 09/11/17-10/01/17    Authorization - Visit Number  1    Authorization - Number of Visits  3    OT Start Time  [phone]    OT Stop Time  1017    OT Time Calculation (min)  40 min    Activity Tolerance  Patient tolerated treatment well    Behavior During Therapy  [facility] for tasks assessed/performed       Past Medical History:  Diagnosis Date  . Anemia   . Anxiety   . Arthritis   . Bipolar 1 disorder ([facility])   . Bursitis    hx of  . Chronic bronchitis ([facility])   . Complication of anesthesia    difficult to wake up and blood pressure drops  . Depression   . Diverticula of intestine   . Dysrhythmia    no medication treatment  . Eczema   . GERD (gastroesophageal reflux disease)   . H. pylori infection   . Headache(784.0)   . Heart murmur   . Hypertension    sees Dr. Shana Chute, is patients primary  . Hypothyroidism    takes synthroid  . Mental disorder    bipolar  . Pneumonia    hx    Past Surgical History:  Procedure Laterality Date  . ABDOMINAL HYSTERECTOMY     has both of ovaries  . CESAREAN SECTION    . COLONOSCOPY    . LUMBAR LAMINECTOMY  08/22/2011   Procedure: MICRODISCECTOMY LUMBAR LAMINECTOMY;  Surgeon: Dorian Heckle, MD;  Location: [facility];  Service: Neurosurgery;  Laterality: Right;  RIGHT Lumbar Five-Sacral One microdiskectomy  . MOUTH SURGERY    . MOUTH SURGERY    . MULTIPLE EXTRACTIONS WITH ALVEOLOPLASTY N/A 08/20/2015   Procedure: Extraction of tooth #'s 1-12,14,20-27, 29 with  alveoloplasty;  Surgeon: Charlynne Pander, DDS;  Location: [facility];  Service: Oral Surgery;  Laterality: N/A;  . TUBAL LIGATION      There were no vitals filed for this visit.  Subjective Assessment - 09/13/17 0940    Subjective   Pt reports pain when doing putty at home, but no pain currently    Patient is accompained by:  Family member    Pertinent History  OA, HTN, Bipolar, anxiety    Patient Stated Goals  Be able to use my hand without pain    Currently in Pain?  No/denies    Pain Onset  More than a month ago       Fludio x to L hand with no adverse reactions for pain/stiffness.   Green putty exercises for grip and IP flex and pinches with 4th digit.  Removing pegs from putty with 3-4th digits.    PROM to 4th digit IP flex.    AROM:  Tendon glides x10 with min cueing   Picking up blocks with gripper set on level 2 (black spring) for sustained grip strength with mod difficulty.  OT Long Term Goals - 08/21/17 0834      OT LONG TERM GOAL #1   Title  Independent with HEP     Baseline  issued initial HEP, will need updates for strengthening    Time  4    Period  Weeks    Status  Achieved      OT LONG TERM GOAL #2   Title  Pt to verbalize understanding with pain management and edema management strategies    Baseline  dependent    Time  4    Period  Weeks    Status  Achieved      OT LONG TERM GOAL #3   Title  Pt to improve Lt ring PIP flexion to 92 degrees or greater    Baseline  85*    Time  4    Period  Weeks    Status  On-going 08/21/17: 90 degrees      OT LONG TERM GOAL #4   Title  Lt grip strength to be 48 lbs or greater to assist in opening jars/containers    Baseline  40 lbs    Time  4    Period  Weeks    Status  Achieved 08/21/17: 60 lbs      OT LONG TERM GOAL #5   Title  Lt grip strength to be 65 lbs or greater to assist in opening tight jars    Baseline  Eval = 40 lbs, 08/21/17 = 60 lbs    Time  4     Period  Weeks    Status  New            Plan - 09/13/17 1610    Clinical Impression Statement  Pt is making good progress towards goals with improving pain, strength, and ROM.      Occupational performance deficits (Please refer to evaluation for details):  ADL's;IADL's    Rehab Potential  Good    OT Frequency  1x / week    OT Duration  2 weeks Beginning January 2019    OT Treatment/Interventions  Self-care/ADL training;Moist Heat;Fluidtherapy;DME and/or AE instruction;Splinting;Contrast Bath;Therapeutic activities;Therapeutic exercise;Scar mobilization;Passive range of motion;Cryotherapy;Electrical Stimulation;Paraffin;Manual Therapy    Plan  continue fluidotherapy, begin assessing progress towards goals with d/c anticipated in next 1-2 visits    Consulted and Agree with Plan of Care  Patient;Family member/caregiver    Family Member Consulted  Friend       Patient will benefit from skilled therapeutic intervention in order to improve the following deficits and impairments:  Decreased range of motion, Impaired flexibility, Increased edema, Impaired sensation, Impaired UE functional use, Pain, Decreased strength  Visit Diagnosis: Stiffness of left hand, not elsewhere classified  Pain in left finger(s)  Muscle weakness (generalized)  Localized edema    Problem List Patient Active Problem List   Diagnosis Date Noted  . Bipolar disorder, curr episode mixed, severe, w/o psychotic features (HCC) 04/03/2016  . Alcohol use disorder, severe, dependence (HCC) 03/30/2016  . Alcohol abuse 01/09/2016  . 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 Diverticulitis 08/15/2015  . Diverticulitis of large intestine with abscess  11/23/2014  . Acute diverticulitis 11/23/2014  . Leukocytosis 11/17/2014  . Essential hypertension 11/17/2014  . Heart murmur   . Chronic bronchitis (HCC)   . Hypothyroidism   . Anxiety   . Depression   . Mental disorder   . GERD (gastroesophageal  reflux disease)   . Simple chronic bronchitis (HCC)     Lymon Kidney 09/13/2017,  9:45 AM  [facility] [address] [place], [place], [address] Phone: [phone]   Fax:  [phone]  Name: [name] MRN: [id number] Date of Birth: 04/12/1961   Willa FraterAngela Chanteria Haggard, OTR/L [facility] [address] [place], [place]  [id number] [phone] phone [phone] 09/13/17 12:21 PM

## 2017-09-17 ENCOUNTER — Ambulatory Visit: Payer: Medicaid Other | Admitting: Occupational Therapy

## 2017-09-24 ENCOUNTER — Ambulatory Visit: Payer: Medicaid Other | Admitting: Occupational Therapy

## 2017-09-24 DIAGNOSIS — M79645 Pain in left finger(s): Secondary | ICD-10-CM

## 2017-09-24 DIAGNOSIS — R600 Localized edema: Secondary | ICD-10-CM

## 2017-09-24 DIAGNOSIS — M25642 Stiffness of left hand, not elsewhere classified: Secondary | ICD-10-CM | POA: Diagnosis not present

## 2017-09-24 DIAGNOSIS — M6281 Muscle weakness (generalized): Secondary | ICD-10-CM

## 2017-09-24 NOTE — Therapy (Signed)
[facility] [address] [place], [place], [address] Phone: [phone]   Fax:  [phone]  Occupational Therapy Treatment  Patient Details  Name: [name] MRN: [id number] Date of Birth: 06/14/1961 No Data Recorded  Encounter Date: 09/24/2017  OT End of Session - 09/24/17 0922    Visit Number  4    Authorization Type  MCD - approved 3 visits     Authorization Time Period  12/10 - 09/11/17-10/01/17    Authorization - Visit Number  2    Authorization - Number of Visits  3    OT Start Time  0835    OT Stop Time  0920    OT Time Calculation (min)  45 min    Activity Tolerance  Patient tolerated treatment well       Past Medical History:  Diagnosis Date  . Anemia   . Anxiety   . Arthritis   . Bipolar 1 disorder (HCC)   . Bursitis    hx of  . Chronic bronchitis (HCC)   . Complication of anesthesia    difficult to wake up and blood pressure drops  . Depression   . Diverticula of intestine   . Dysrhythmia    no medication treatment  . Eczema   . GERD (gastroesophageal reflux disease)   . H. pylori infection   . Headache(784.0)   . Heart murmur   . Hypertension    sees Dr. Spruill, is patients primary  . Hypothyroidism    takes synthroid  . Mental disorder    bipolar  . Pneumonia    hx    Past Surgical History:  Procedure Laterality Date  . ABDOMINAL HYSTERECTOMY     has both of ovaries  . CESAREAN SECTION    . COLONOSCOPY    . LUMBAR LAMINECTOMY  08/22/2011   Procedure: MICRODISCECTOMY LUMBAR LAMINECTOMY;  Surgeon: Joseph D Stern, MD;  Location: [facility];  Service: Neurosurgery;  Laterality: Right;  RIGHT Lumbar Five-Sacral One microdiskectomy  . MOUTH SURGERY    . MOUTH SURGERY    . MULTIPLE EXTRACTIONS WITH ALVEOLOPLASTY N/A 08/20/2015   Procedure: Extraction of tooth #'s 1-12,14,20-27, 29 with alveoloplasty;  Surgeon: Ronald F Kulinski, DDS;  Location: [facility] OR;  Service: Oral Surgery;   Laterality: N/A;  . TUBAL LIGATION      There were no vitals filed for this visit.  Subjective Assessment - 09/24/17 0841    Subjective   I haven't been doing my exercises like I should. It usually only hurts when I push on it.     Patient is accompained by:  Family member    Pertinent History  OA, HTN, Bipolar, anxiety    Patient Stated Goals  Be able to use my hand without pain    Currently in Pain?  Yes    Pain Score  7     Pain Location  -- ring finger    Pain Orientation  Left    Pain Descriptors / Indicators  Aching    Pain Type  Acute pain    Pain Onset  More than a month ago    Pain Frequency  Intermittent    Aggravating Factors   cold weather, P/ROM    Pain Relieving Factors  elevation                   OT Treatments/Exercises (OP) - 09/24/17 0001      Hand Exercises   Other Hand Exercises    Reviewed recommended repetitions and frequency per day of HEP's. Pt advised to do ex's as instructed giving at least 2 hours b/t each session. Pt had been doing ex's too frequently making finger sore, and then she admitted she quit doing ex's. (Pt advised only to do putty HEP 2x/day as instructed on HEP handout)    Other Hand Exercises  Pt/therapist went through each ex to make sure she was doing properly. Pt demo each 5-10 reps (as instructed)       LUE Fluidotherapy   Number Minutes Fluidotherapy  12 Minutes    LUE Fluidotherapy Location  Hand    Comments  for pain management and stiffness                  OT Long Term Goals - 09/24/17 0923      OT LONG TERM GOAL #1   Title  Independent with HEP     Baseline  issued initial HEP, will need updates for strengthening    Time  4    Period  Weeks    Status  Achieved      OT LONG TERM GOAL #2   Title  Pt to verbalize understanding with pain management and edema management strategies    Baseline  dependent    Time  4    Period  Weeks    Status  Achieved      OT LONG TERM GOAL #3   Title  Pt to  improve Lt ring PIP flexion to 92 degrees or greater    Baseline  85*    Time  4    Period  Weeks    Status  Achieved 08/21/17: 90 degrees, 09/24/17: 94 degrees      OT LONG TERM GOAL #4   Title  Lt grip strength to be 48 lbs or greater to assist in opening jars/containers    Baseline  40 lbs    Time  4    Period  Weeks    Status  Achieved 08/21/17: 60 lbs      OT LONG TERM GOAL #5   Title  Lt grip strength to be 65 lbs or greater to assist in opening tight jars    Baseline  Eval = 40 lbs, 08/21/17 = 60 lbs    Time  4    Period  Weeks    Status  On-going            Plan - 09/24/17 0923    Clinical Impression Statement  Pt met LTG #3. Pt still remains stiff do to not doing exercises at home    Rehab Potential  Good    OT Frequency  1x / week    OT Duration  -- 3 weeks (beginning Jan 2019)    OT Treatment/Interventions  Self-care/ADL training;Moist Heat;Fluidtherapy;DME and/or AE instruction;Splinting;Contrast Bath;Therapeutic activities;Therapeutic exercise;Scar mobilization;Passive range of motion;Cryotherapy;Electrical Stimulation;Paraffin;Manual Therapy    Plan  re-assess grip strength, assess pain, fluidotherapy, and d/c next session    Consulted and Agree with Plan of Care  Patient;Family member/caregiver    Family Member Consulted  Friend       Patient will benefit from skilled therapeutic intervention in order to improve the following deficits and impairments:  Decreased range of motion, Impaired flexibility, Increased edema, Impaired sensation, Impaired UE functional use, Pain, Decreased strength  Visit Diagnosis: Stiffness of left hand, not elsewhere classified  Pain in left finger(s)  Muscle weakness (generalized)  Localized edema    Problem List Patient   Active Problem List   Diagnosis Date Noted  . Bipolar disorder, curr episode mixed, severe, w/o psychotic features ([place]) 04/03/2016  . Alcohol use disorder, severe, dependence ([place]) 03/30/2016  .  Alcohol abuse 01/09/2016  . Alcohol-induced mood disorder ([place]) 01/09/2016  . Diverticulitis 08/15/2015  . Diverticulitis of large intestine with abscess  11/23/2014  . Acute diverticulitis 11/23/2014  . Leukocytosis 11/17/2014  . Essential hypertension 11/17/2014  . Heart murmur   . Chronic bronchitis ([place])   . Hypothyroidism   . Anxiety   . Depression   . Mental disorder   . GERD (gastroesophageal reflux disease)   . Simple chronic bronchitis ([place])     Carey Bullocks, OTR/L 09/24/2017, 9:26 AM  [facility] [address] [place], [place], [address] Phone: [phone]   Fax:  [phone]  Name: [name] MRN: [id number] Date of Birth: 23-Mar-1961

## 2017-10-01 ENCOUNTER — Ambulatory Visit: Payer: Medicaid Other | Admitting: Occupational Therapy

## 2017-10-01 DIAGNOSIS — M25642 Stiffness of left hand, not elsewhere classified: Secondary | ICD-10-CM

## 2017-10-01 DIAGNOSIS — M79645 Pain in left finger(s): Secondary | ICD-10-CM

## 2017-10-01 DIAGNOSIS — M6281 Muscle weakness (generalized): Secondary | ICD-10-CM

## 2017-10-01 NOTE — Therapy (Signed)
[facility] [address] [place], [place], [address] Phone: [phone]   Fax:  [phone]  Occupational Therapy Treatment  Patient Details  Name: [name] MRN: [id number] Date of Birth: 05/05/1961 No Data Recorded  Encounter Date: 10/01/2017  OT End of Session - 10/01/17 1257    Authorization Type  MCD - approved 3 visits     Authorization Time Period  12/10 - 09/11/17-10/01/17    Authorization - Visit Number  3    Authorization - Number of Visits  3    OT Start Time  1230    OT Stop Time  1255    OT Time Calculation (min)  25 min    Activity Tolerance  Patient tolerated treatment well    Behavior During Therapy  [facility] for tasks assessed/performed       Past Medical History:  Diagnosis Date  . Anemia   . Anxiety   . Arthritis   . Bipolar 1 disorder ([place])   . Bursitis    hx of  . Chronic bronchitis ([place])   . Complication of anesthesia    difficult to wake up and blood pressure drops  . Depression   . Diverticula of intestine   . Dysrhythmia    no medication treatment  . Eczema   . GERD (gastroesophageal reflux disease)   . H. pylori infection   . Headache(784.0)   . Heart murmur   . Hypertension    sees Dr. Montez Morita, is patients primary  . Hypothyroidism    takes synthroid  . Mental disorder    bipolar  . Pneumonia    hx    Past Surgical History:  Procedure Laterality Date  . ABDOMINAL HYSTERECTOMY     has both of ovaries  . CESAREAN SECTION    . COLONOSCOPY    . LUMBAR LAMINECTOMY  08/22/2011   Procedure: MICRODISCECTOMY LUMBAR LAMINECTOMY;  Surgeon: Peggyann Shoals, MD;  Location: [facility];  Service: Neurosurgery;  Laterality: Right;  RIGHT Lumbar Five-Sacral One microdiskectomy  . MOUTH SURGERY    . MOUTH SURGERY    . MULTIPLE EXTRACTIONS WITH ALVEOLOPLASTY N/A 08/20/2015   Procedure: Extraction of tooth #'s 1-12,14,20-27, 29 with alveoloplasty;  Surgeon: Lenn Cal, DDS;   Location: [facility];  Service: Oral Surgery;  Laterality: N/A;  . TUBAL LIGATION      There were no vitals filed for this visit.  Subjective Assessment - 10/01/17 1233    Subjective   It's doing better now that I'm spacing out my ex's every 2 hours. It's mainly sore now first thing in the morning    Patient is accompained by:  Family member    Pertinent History  OA, HTN, Bipolar, anxiety    Patient Stated Goals  Be able to use my hand without pain    Currently in Pain?  No/denies                   OT Treatments/Exercises (OP) - 10/01/17 0001      ADLs   ADL Comments  Reviewed goals and progress to date. Pt had no further questions      Hand Exercises   Other Hand Exercises  IP flexion exercise x 15 reps digits 2-5 Lt hand.     Other Hand Exercises  Gripper set at level 2 resistance to pick up blocks for sustained grip strength Lt hand. Pt was able to tolerate increased resistance to level 3 for last 1/3  amt of blocks      LUE Fluidotherapy   Number Minutes Fluidotherapy  10 Minutes    LUE Fluidotherapy Location  Hand    Comments  for stiffness at beginnin of session                  OT Long Term Goals - 10/01/17 1258      OT LONG TERM GOAL #1   Title  Independent with HEP     Baseline  issued initial HEP, will need updates for strengthening    Time  4    Period  Weeks    Status  Achieved      OT LONG TERM GOAL #2   Title  Pt to verbalize understanding with pain management and edema management strategies    Baseline  dependent    Time  4    Period  Weeks    Status  Achieved      OT LONG TERM GOAL #3   Title  Pt to improve Lt ring PIP flexion to 92 degrees or greater    Baseline  85*    Time  4    Period  Weeks    Status  Achieved 08/21/17: 90 degrees, 09/24/17: 94 degrees      OT LONG TERM GOAL #4   Title  Lt grip strength to be 48 lbs or greater to assist in opening jars/containers    Baseline  40 lbs    Time  4    Period  Weeks    Status   Achieved 08/21/17: 60 lbs      OT LONG TERM GOAL #5   Title  Lt grip strength to be 65 lbs or greater to assist in opening tight jars    Baseline  Eval = 40 lbs, 08/21/17 = 60 lbs    Time  4    Period  Weeks    Status  Not Met 60 lbs            Plan - 10/01/17 1258    Clinical Impression Statement  Pt met 4/5 LTG's.     OT Treatment/Interventions  Self-care/ADL training;Moist Heat;Fluidtherapy;DME and/or AE instruction;Splinting;Contrast Bath;Therapeutic activities;Therapeutic exercise;Scar mobilization;Passive range of motion;Cryotherapy;Electrical Stimulation;Paraffin;Manual Therapy    Plan  D/C O.T.     Consulted and Agree with Plan of Care  Patient;Family member/caregiver       Patient will benefit from skilled therapeutic intervention in order to improve the following deficits and impairments:  Decreased range of motion, Impaired flexibility, Increased edema, Impaired sensation, Impaired UE functional use, Pain, Decreased strength  Visit Diagnosis: Stiffness of left hand, not elsewhere classified  Pain in left finger(s)  Muscle weakness (generalized)    Problem List Patient Active Problem List   Diagnosis Date Noted  . Bipolar disorder, curr episode mixed, severe, w/o psychotic features (HCC) 04/03/2016  . Alcohol use disorder, severe, dependence (HCC) 03/30/2016  . Alcohol abuse 01/09/2016  . 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 Diverticulitis 08/15/2015  . Diverticulitis of large intestine with abscess  11/23/2014  . Acute diverticulitis 11/23/2014  . Leukocytosis 11/17/2014  . Essential hypertension 11/17/2014  . Heart murmur   . Chronic bronchitis (HCC)   . Hypothyroidism   . Anxiety   . Depression   . Mental disorder   . GERD (gastroesophageal reflux disease)   . Simple chronic bronchitis (HCC)      OCCUPATIONAL THERAPY DISCHARGE SUMMARY  Visits from Start of Care: 5  Current functional level   related to goals / functional  outcomes: SEE ABOVE - pt met all goals except updated LTG #5   Remaining deficits: Stiffness Lt ring PIP joint Occasional pain   Education / Equipment: HEP's  Plan: Patient agrees to discharge.  Patient goals were met. Patient is being discharged due to meeting the stated rehab goals.  ?????        Carey Bullocks, OTR/L 10/01/2017, 12:59 PM  [facility] [address] [place], [place], [address] Phone: [phone]   Fax:  [phone]  Name: [name] MRN: [id number] Date of Birth: August 11, 1961

## 2017-10-25 ENCOUNTER — Other Ambulatory Visit: Payer: Self-pay | Admitting: Obstetrics

## 2017-10-25 DIAGNOSIS — R52 Pain, unspecified: Secondary | ICD-10-CM

## 2017-10-26 ENCOUNTER — Other Ambulatory Visit: Payer: Self-pay

## 2017-10-26 NOTE — Progress Notes (Signed)
Responded to pharmacy regarding error in e-prescribing Rx.  They did not get order. Gave Pharmacist order via phone

## 2017-10-29 ENCOUNTER — Other Ambulatory Visit: Payer: Self-pay | Admitting: Obstetrics

## 2017-10-29 DIAGNOSIS — R52 Pain, unspecified: Secondary | ICD-10-CM

## 2017-10-30 ENCOUNTER — Telehealth: Payer: Self-pay

## 2017-10-30 NOTE — Telephone Encounter (Signed)
Patient notified to pick up RX.

## 2017-11-02 ENCOUNTER — Other Ambulatory Visit: Payer: Self-pay | Admitting: Internal Medicine

## 2017-11-02 ENCOUNTER — Ambulatory Visit
Admission: RE | Admit: 2017-11-02 | Discharge: 2017-11-02 | Disposition: A | Discharge status: Home / Self Care | Payer: Medicaid Other | Source: Ambulatory Visit | Attending: Internal Medicine | Admitting: Internal Medicine

## 2017-11-02 DIAGNOSIS — M25511 Pain in right shoulder: Secondary | ICD-10-CM

## 2017-11-02 DIAGNOSIS — M25512 Pain in left shoulder: Secondary | ICD-10-CM

## 2017-11-04 NOTE — Telephone Encounter (Signed)
Ibuprofen RX

## 2017-11-06 ENCOUNTER — Other Ambulatory Visit: Payer: Self-pay | Admitting: Internal Medicine

## 2017-11-06 DIAGNOSIS — Z1239 Encounter for other screening for malignant neoplasm of breast: Secondary | ICD-10-CM

## 2017-11-13 ENCOUNTER — Ambulatory Visit
Admission: RE | Admit: 2017-11-13 | Discharge: 2017-11-13 | Disposition: A | Discharge status: Home / Self Care | Payer: Medicaid Other | Source: Ambulatory Visit | Attending: Internal Medicine | Admitting: Internal Medicine

## 2017-11-13 DIAGNOSIS — Z1239 Encounter for other screening for malignant neoplasm of breast: Secondary | ICD-10-CM

## 2018-03-18 ENCOUNTER — Encounter (HOSPITAL_COMMUNITY): Payer: Self-pay | Admitting: Dentistry

## 2018-03-18 ENCOUNTER — Ambulatory Visit (HOSPITAL_COMMUNITY): Payer: Medicaid Other | Admitting: Dentistry

## 2018-03-18 VITALS — BP 122/66 | HR 56 | Temp 98.9°F

## 2018-03-18 DIAGNOSIS — K08109 Complete loss of teeth, unspecified cause, unspecified class: Secondary | ICD-10-CM

## 2018-03-18 DIAGNOSIS — Z463 Encounter for fitting and adjustment of dental prosthetic device: Secondary | ICD-10-CM | POA: Diagnosis not present

## 2018-03-18 DIAGNOSIS — K0820 Unspecified atrophy of edentulous alveolar ridge: Secondary | ICD-10-CM | POA: Diagnosis not present

## 2018-03-18 DIAGNOSIS — R220 Localized swelling, mass and lump, head: Secondary | ICD-10-CM

## 2018-03-18 DIAGNOSIS — Z972 Presence of dental prosthetic device (complete) (partial): Secondary | ICD-10-CM

## 2018-03-18 NOTE — Progress Notes (Signed)
03/18/2018  Patient Name:   [name] Date of Birth:   April 17, 1961 Medical Record Number: [id number]   BP 122/66 (BP Location: Left Arm)   Pulse (!) 56   Temp 98.9 F (37.2 C)   [name] is a 57 year old female that presents for periodic oral exam and evaluation of upper and lower complete dentures. Patient was initially seen in December 2016 for evaluation of facial swelling. Patient had full mouth extractions on 08/20/2015 in the operating room. Upper and lower complete dentures were then fabricated and inserted on 11/02/2015. She has been seen for multiple denture adjustment appointments since then.  Patient now presents for periodic oral examination and evaluation of upper and lower complete dentures.  Patient Active Problem List   Diagnosis Date Noted  . Bipolar disorder, curr episode mixed, severe, w/o psychotic features (HCC) 04/03/2016  . Alcohol use disorder, severe, dependence (HCC) 03/30/2016  . Alcohol abuse 01/09/2016  . 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 Diverticulitis 08/15/2015  . Diverticulitis of large intestine with abscess  11/23/2014  . Acute diverticulitis 11/23/2014  . Leukocytosis 11/17/2014  . Essential hypertension 11/17/2014  . Heart murmur   . Chronic bronchitis (HCC)   . Hypothyroidism   . Anxiety   . Depression   . Mental disorder   . GERD (gastroesophageal reflux disease)   . Simple chronic bronchitis (HCC)      Medical Hx Update:  Past Medical History:  Diagnosis Date  . Anemia   . Anxiety   . Arthritis   . Bipolar 1 disorder (HCC)   . Bursitis    hx of  . Chronic bronchitis (HCC)   . Complication of anesthesia    difficult to wake up and blood pressure drops  . Depression   . Diverticula of intestine   . Dysrhythmia    no medication treatment  . Eczema   . GERD (gastroesophageal reflux disease)   . H. pylori infection   . Headache(784.0)   . Heart murmur   . Hypertension    sees Dr. Shana Chute, is patients  primary  . Hypothyroidism    takes synthroid  . Mental disorder    bipolar  . Pneumonia    hx  .  Past Surgical History:  Procedure Laterality Date  . ABDOMINAL HYSTERECTOMY     has both of ovaries  . CESAREAN SECTION    . COLONOSCOPY    . LUMBAR LAMINECTOMY  08/22/2011   Procedure: MICRODISCECTOMY LUMBAR LAMINECTOMY;  Surgeon: Dorian Heckle, MD;  Location: [facility];  Service: Neurosurgery;  Laterality: Right;  RIGHT Lumbar Five-Sacral One microdiskectomy  . MOUTH SURGERY    . MOUTH SURGERY    . MULTIPLE EXTRACTIONS WITH ALVEOLOPLASTY N/A 08/20/2015   Procedure: Extraction of tooth #'s 1-12,14,20-27, 29 with alveoloplasty;  Surgeon: Charlynne Pander, DDS;  Location: [facility] OR;  Service: Oral Surgery;  Laterality: N/A;  . TUBAL LIGATION      ALLERGIES/ADVERSE DRUG REACTIONS: Allergies  Allergen Reactions  . Benazepril Anaphylaxis, Hives and Itching  . Penicillins Hives, Itching and Swelling    Face: itching/swelling Has patient had a PCN reaction causing immediate rash, facial/tongue/throat swelling, SOB or lightheadedness with hypotension: Yes Has patient had a PCN reaction causing severe rash involving mucus membranes or skin necrosis: Unk Has patient had a PCN reaction that required hospitalization: No Has patient had a PCN reaction occurring within the last 10 years: No If all of the above answers are "NO", then  may proceed with Cephalosporin use.   . Sulfa Antibiotics Hives    Itching and facial swelling  . Ciprofloxacin Hcl Hives    "funny sensation in gums"  . Clindamycin/Lincomycin Hives and Itching    Welts (also)  . Erythromycin Hives and Itching  . Other     Pt has problems with being awaken from anesthesia, Her blood pressure drops very low  . Zithromax [Azithromycin] Hives and Itching  . Sulfamethoxazole Rash  . Tape Itching and Rash    MEDICATIONS: Current Outpatient Medications  Medication Sig Dispense Refill  . albuterol (PROVENTIL HFA;VENTOLIN  HFA) 108 (90 Base) MCG/ACT inhaler Inhale 2 puffs into the lungs every 4 (four) hours as needed for wheezing or shortness of breath. 1 Inhaler 0  . amLODipine (NORVASC) 10 MG tablet Take 1 tablet (10 mg total) by mouth daily. Take this instead of your LOTREL until you see your regular doctor: For high blood pressure (Patient taking differently: Take 10 mg by mouth daily. ) 1 tablet 0  . atenolol (TENORMIN) 50 MG tablet Take 1 tablet (50 mg total) by mouth daily. Need Dr appt for future refills: For high blood pressure (Patient taking differently: Take 50 mg by mouth daily. ) 1 tablet 0  . citalopram (CELEXA) 20 MG tablet Take 20 mg by mouth daily as needed (depression/anxiety).    . hydrochlorothiazide (MICROZIDE) 12.5 MG capsule Take 12.5 mg by mouth daily.    Marland Kitchen ibuprofen (ADVIL,MOTRIN) 800 MG tablet Take 1 tablet (800 mg total) by mouth 3 (three) times daily with meals as needed for headache or moderate pain. (Patient not taking: Reported on 08/06/2017) 30 tablet 3  . ibuprofen (ADVIL,MOTRIN) 800 MG tablet TAKE 1 TABLET (800 MG TOTAL) BY MOUTH EVERY EIGHT HOURS AS NEEDED. 30 tablet 5  . levothyroxine (SYNTHROID, LEVOTHROID) 125 MCG tablet Take 1 tablet (125 mcg total) by mouth daily. For hypothyroidism 1 tablet 0  . neomycin-bacitracin-polymyxin (NEOSPORIN) OINT Apply 1 application topically 2 (two) times daily as needed (for skin irritation on hands).     . ondansetron (ZOFRAN) 4 MG tablet Take 1 tablet (4 mg total) by mouth every 6 (six) hours. (Patient not taking: Reported on 02/05/2017) 12 tablet 0  . oxyCODONE-acetaminophen (PERCOCET) 10-325 MG tablet Take 1 tablet by mouth every 4 (four) hours as needed for pain. (Patient not taking: Reported on 08/06/2017) 30 tablet 0  . polycarbophil (FIBERCON) 625 MG tablet Take 1 tablet (625 mg total) by mouth daily as needed (for regularity). Constipation    . polyethylene glycol (MIRALAX / GLYCOLAX) packet Take 17 g by mouth daily as needed for mild  constipation. 14 each 0   No current facility-administered medications for this visit.     C/C: Patient is complaining of some minor denture irritation.   HPI:  [name] is a 57 year old female that presents for periodic oral exam and evaluation of upper and lower complete dentures. Patient was initially seen in December 2016 for evaluation of facial swelling. Patient had full mouth extractions on 08/20/2015 in the operating room. Upper and lower complete dentures were then fabricated and inserted on 11/02/2015. She has been seen for multiple denture adjustment appointments since then.  Patient now presents for oral examination and evaluation of upper and lower complete dentures.   DENTAL EXAM: General: Patient is a well-developed, well-nourished female in no acute distress. Vitals: BP 122/66 (BP Location: Left Arm)   Pulse (!) 56   Temp 98.9 F (37.2 C)  Extraoral Exam: There is no neck lymphadenopathy palpated. The patient denies acute TMJ symptoms. Intraoral  Exam: Patient has normal saliva. There is no evidence of denture irritation or erythema. Dentition: Patient is edentulous. There is atrophy of the lower edentulous alveolar ridge. Prosthodontic: Patient has an upper lower complete dentures that are stable and retentive. Pressure indicating paste was applied to the upper and lower dentures. Significant denture adjustments were made on the intaglio surface of the lower complete denture. Adjustments made as needed. Dentures were then polished. Occlusion:  Occlusion was evaluated and is acceptable in centric relation and protrusive strokes. Patient with tendency to protrude lower jaw in an end to end position but is able to hit the maxillary intercuspation position today without problem. Patient expressed understanding of why she needs to find the maximum intercuspation position to prevent instability of the dentures during occlusion.  Assessments: 1. Patient is edentulous 2.  There is atrophy of the edentulous alveolar ridges. 3. Upper and lower complete dentures are stable and retentive.  Plan/recommendations: 1. Keep dentures out if sore spots arise. 2. Use salt water rinses as needed to aid healing. 3. Return to clinic as scheduled or call if problems arise before then.   Charlynne Panderonald F. Keni Wafer, DDS

## 2018-03-18 NOTE — Patient Instructions (Signed)
Plan/recommendations: 1. Keep dentures out if sore spots arise. 2. Use salt water rinses as needed to aid healing. 3. Return to clinic as scheduled or call if problems arise before then.   Ronald F. Kulinski, DDS  

## 2018-05-31 ENCOUNTER — Ambulatory Visit
Admission: RE | Admit: 2018-05-31 | Discharge: 2018-05-31 | Disposition: A | Discharge status: Home / Self Care | Payer: Medicaid Other | Source: Ambulatory Visit | Attending: Physician Assistant | Admitting: Physician Assistant

## 2018-05-31 ENCOUNTER — Other Ambulatory Visit: Payer: Self-pay | Admitting: Physician Assistant

## 2018-05-31 DIAGNOSIS — M545 Low back pain: Secondary | ICD-10-CM

## 2018-05-31 DIAGNOSIS — M25561 Pain in right knee: Secondary | ICD-10-CM

## 2018-05-31 DIAGNOSIS — M25562 Pain in left knee: Secondary | ICD-10-CM

## 2018-09-13 ENCOUNTER — Other Ambulatory Visit: Payer: Self-pay | Admitting: Obstetrics

## 2018-09-13 NOTE — Telephone Encounter (Signed)
Please review for refill.  

## 2019-03-18 ENCOUNTER — Encounter (HOSPITAL_COMMUNITY): Payer: Self-pay | Admitting: Dentistry

## 2019-09-17 ENCOUNTER — Other Ambulatory Visit: Payer: Self-pay | Admitting: Physician Assistant

## 2019-09-17 DIAGNOSIS — Z1231 Encounter for screening mammogram for malignant neoplasm of breast: Secondary | ICD-10-CM

## 2019-11-17 ENCOUNTER — Other Ambulatory Visit: Payer: Self-pay

## 2019-11-17 ENCOUNTER — Ambulatory Visit
Admission: RE | Admit: 2019-11-17 | Discharge: 2019-11-17 | Disposition: A | Discharge status: Home / Self Care | Payer: Medicaid Other | Source: Ambulatory Visit | Attending: Physician Assistant | Admitting: Physician Assistant

## 2019-11-17 DIAGNOSIS — Z1231 Encounter for screening mammogram for malignant neoplasm of breast: Secondary | ICD-10-CM

## 2020-04-12 ENCOUNTER — Other Ambulatory Visit (HOSPITAL_COMMUNITY)
Admission: RE | Admit: 2020-04-12 | Discharge: 2020-04-12 | Disposition: A | Discharge status: Home / Self Care | Payer: Medicaid Other | Source: Ambulatory Visit | Attending: Obstetrics | Admitting: Obstetrics

## 2020-04-12 ENCOUNTER — Encounter: Payer: Self-pay | Admitting: Obstetrics

## 2020-04-12 ENCOUNTER — Other Ambulatory Visit: Payer: Self-pay

## 2020-04-12 ENCOUNTER — Ambulatory Visit (INDEPENDENT_AMBULATORY_CARE_PROVIDER_SITE_OTHER): Payer: Medicaid Other | Admitting: Obstetrics

## 2020-04-12 VITALS — BP 134/72 | HR 60 | Wt 254.3 lb

## 2020-04-12 DIAGNOSIS — Z0000 Encounter for general adult medical examination without abnormal findings: Secondary | ICD-10-CM | POA: Diagnosis not present

## 2020-04-12 DIAGNOSIS — N898 Other specified noninflammatory disorders of vagina: Secondary | ICD-10-CM | POA: Insufficient documentation

## 2020-04-12 DIAGNOSIS — Z01419 Encounter for gynecological examination (general) (routine) without abnormal findings: Secondary | ICD-10-CM

## 2020-04-12 NOTE — Progress Notes (Signed)
Pt presents for annual c/o vaginal  irritation x 2 weeks Normal 3D Mammogram 11/17/2019 Colonoscopy 5 yrs ago

## 2020-04-12 NOTE — Progress Notes (Addendum)
Subjective:        [name] is a 59 y.o. female here for a routine exam.  Current complaints: Vaginal discharge and irritation.    Personal health questionnaire:  Is patient [name], have a family history of breast and/or ovarian cancer: yes Is there a family history of uterine cancer diagnosed at age < 25, gastrointestinal cancer, urinary tract cancer, family member who is a Personnel officer syndrome-associated carrier: yes Is the patient overweight and hypertensive, family history of diabetes, personal history of gestational diabetes, preeclampsia or PCOS: yes Is patient over 19, have PCOS,  family history of premature CHD under age 92, diabetes, smoke, have hypertension or peripheral artery disease:  no At any time, has a partner hit, kicked or otherwise hurt or frightened you?: no Over the past 2 weeks, have you felt down, depressed or hopeless?: no Over the past 2 weeks, have you felt little interest or pleasure in doing things?:no   Gynecologic History No LMP recorded. Patient has had a hysterectomy. Contraception: status post hysterectomy Last Pap: unknown . Results were: normal Last mammogram: 2021. Results were: normal  Obstetric History OB History  No obstetric history on file.    Past Medical History:  Diagnosis Date  . Anemia   . Anxiety   . Arthritis   . Bipolar 1 disorder (HCC)   . Bursitis    hx of  . Chronic bronchitis (HCC)   . Complication of anesthesia    difficult to wake up and blood pressure drops  . Depression   . Diverticula of intestine   . Dysrhythmia    no medication treatment  . Eczema   . GERD (gastroesophageal reflux disease)   . H. pylori infection   . Headache(784.0)   . Heart murmur   . Hypertension    sees Dr. Shana Chute, is patients primary  . Hypothyroidism    takes synthroid  . Mental disorder    bipolar  . Pneumonia    hx    Past Surgical History:  Procedure Laterality Date  . ABDOMINAL HYSTERECTOMY     has both  of ovaries  . CESAREAN SECTION    . COLONOSCOPY    . LUMBAR LAMINECTOMY  08/22/2011   Procedure: MICRODISCECTOMY LUMBAR LAMINECTOMY;  Surgeon: Dorian Heckle, MD;  Location: [facility];  Service: Neurosurgery;  Laterality: Right;  RIGHT Lumbar Five-Sacral One microdiskectomy  . MOUTH SURGERY    . MOUTH SURGERY    . MULTIPLE EXTRACTIONS WITH ALVEOLOPLASTY N/A 08/20/2015   Procedure: Extraction of tooth #'s 1-12,14,20-27, 29 with alveoloplasty;  Surgeon: Charlynne Pander, DDS;  Location: [facility] OR;  Service: Oral Surgery;  Laterality: N/A;  . TUBAL LIGATION       Current Outpatient Medications:  .  albuterol (PROVENTIL HFA;VENTOLIN HFA) 108 (90 Base) MCG/ACT inhaler, Inhale 2 puffs into the lungs every 4 (four) hours as needed for wheezing or shortness of breath., Disp: 1 Inhaler, Rfl: 0 .  amLODipine (NORVASC) 10 MG tablet, Take 1 tablet (10 mg total) by mouth daily. Take this instead of your LOTREL until you see your regular doctor: For high blood pressure (Patient taking differently: Take 10 mg by mouth daily. ), Disp: 1 tablet, Rfl: 0 .  atenolol (TENORMIN) 50 MG tablet, Take 1 tablet (50 mg total) by mouth daily. Need Dr appt for future refills: For high blood pressure (Patient taking differently: Take 50 mg by mouth daily. ), Disp: 1 tablet, Rfl: 0 .  atorvastatin (LIPITOR) 10  MG tablet, Take 10 mg by mouth daily., Disp: , Rfl:  .  citalopram (CELEXA) 20 MG tablet, Take 20 mg by mouth daily as needed (depression/anxiety)., Disp: , Rfl:  .  hydrochlorothiazide (MICROZIDE) 12.5 MG capsule, Take 12.5 mg by mouth daily., Disp: , Rfl:  .  ibuprofen (ADVIL,MOTRIN) 800 MG tablet, Take 1 tablet (800 mg total) by mouth 3 (three) times daily with meals as needed for headache or moderate pain., Disp: 30 tablet, Rfl: 3 .  levothyroxine (SYNTHROID, LEVOTHROID) 125 MCG tablet, Take 1 tablet (125 mcg total) by mouth daily. For hypothyroidism, Disp: 1 tablet, Rfl: 0 .  oxyCODONE-acetaminophen (PERCOCET)  10-325 MG tablet, Take 1 tablet by mouth every 4 (four) hours as needed for pain., Disp: 30 tablet, Rfl: 0 .  polycarbophil (FIBERCON) 625 MG tablet, Take 1 tablet (625 mg total) by mouth daily as needed (for regularity). Constipation, Disp: , Rfl:  .  polyethylene glycol (MIRALAX / GLYCOLAX) packet, Take 17 g by mouth daily as needed for mild constipation., Disp: 14 each, Rfl: 0 .  ibuprofen (ADVIL,MOTRIN) 800 MG tablet, TAKE 1 TABLET (800 MG TOTAL) BY MOUTH EVERY EIGHT HOURS AS NEEDED., Disp: 30 tablet, Rfl: 5 .  neomycin-bacitracin-polymyxin (NEOSPORIN) OINT, Apply 1 application topically 2 (two) times daily as needed (for skin irritation on hands).  (Patient not taking: Reported on 04/12/2020), Disp: , Rfl:  .  ondansetron (ZOFRAN) 4 MG tablet, Take 1 tablet (4 mg total) by mouth every 6 (six) hours. (Patient not taking: Reported on 04/12/2020), Disp: 12 tablet, Rfl: 0 Allergies  Allergen Reactions  . Benazepril Anaphylaxis, Hives and Itching  . Penicillins Hives, Itching and Swelling    Face: itching/swelling Has patient had a PCN reaction causing immediate rash, facial/tongue/throat swelling, SOB or lightheadedness with hypotension: Yes Has patient had a PCN reaction causing severe rash involving mucus membranes or skin necrosis: Unk Has patient had a PCN reaction that required hospitalization: No Has patient had a PCN reaction occurring within the last 10 years: No If all of the above answers are "NO", then may proceed with Cephalosporin use.   . Sulfa Antibiotics Hives    Itching and facial swelling  . Ciprofloxacin Hcl Hives    "funny sensation in gums"  . Clindamycin/Lincomycin Hives and Itching    Welts (also)  . Erythromycin Hives and Itching  . Other     Pt has problems with being awaken from anesthesia, Her blood pressure drops very low  . Zithromax [Azithromycin] Hives and Itching  . Sulfamethoxazole Rash  . Tape Itching and Rash    Social History   Tobacco Use  . Smoking  status: Current Every Day Smoker    Packs/day: 0.25    Years: 20.00    Pack years: 5.00    Types: Cigarettes  . Smokeless tobacco: Never Used  . Tobacco comment: 4-6 cigs/day  Substance Use Topics  . Alcohol use: Yes    Alcohol/week: 0.0 standard drinks    Comment: occ    Family History  Problem Relation Age of Onset  . Anesthesia problems Mother   . Cancer Mother        thyroid  . Hypertension Mother   . Breast cancer Mother 64  . Anesthesia problems Sister   . Hypertension Sister   . Cancer Maternal Aunt   . Cancer Maternal Aunt   . Colon cancer Sister   . Stroke Sister   . Hypertension Brother       Review of Systems  Constitutional: negative for fatigue and weight loss Respiratory: negative for cough and wheezing Cardiovascular: negative for chest pain, fatigue and palpitations Gastrointestinal: negative for abdominal pain and change in bowel habits Musculoskeletal:negative for myalgias Neurological: negative for gait problems and tremors Behavioral/Psych: negative for abusive relationship, depression Endocrine: negative for temperature intolerance    Genitourinary:negative for abnormal menstrual periods, genital lesions, hot flashes, sexual problems.  Positive for vaginal discharge Integument/breast: negative for breast lump, breast tenderness, nipple discharge and skin lesion(s)    Objective:       BP 134/72   Pulse 60   Wt 254 lb 4.8 oz (115.3 kg)   BMI 39.83 kg/m  General:   alert and no distress  Skin:   no rash or abnormalities  Lungs:   clear to auscultation bilaterally  Heart:   regular rate and rhythm, S1, S2 normal, no murmur, click, rub or gallop  Breasts:   normal without suspicious masses, skin or nipple changes or axillary nodes  Abdomen:  normal findings: no organomegaly, soft, non-tender and no hernia  Pelvis:  External genitalia: normal general appearance Urinary system: urethral meatus normal and bladder without fullness,  nontender Vaginal: normal without tenderness, induration or masses Cervix: absent Adnexa: normal bimanual exam Uterus: absent   Lab Review Urine pregnancy test Labs reviewed yes Radiologic studies reviewed yes  50% of 20 min visit spent on counseling and coordination of care.   Assessment:     1. Encounter for routine gynecological examination with Papanicolaou smear of cervix - doing well  2. Vaginal discharge Rx: - Cervicovaginal ancillary only( [place])    Plan:    Education reviewed: calcium supplements, depression evaluation, low fat, low cholesterol diet, safe sex/STD prevention, self breast exams and weight bearing exercise. Contraception: status post hysterectomy. Follow up in: 1 year.    Brock Bad, MD 04/12/2020 10:44 AM

## 2020-04-13 ENCOUNTER — Telehealth: Payer: Self-pay

## 2020-04-13 ENCOUNTER — Other Ambulatory Visit: Payer: Self-pay | Admitting: Obstetrics

## 2020-04-13 DIAGNOSIS — N760 Acute vaginitis: Secondary | ICD-10-CM

## 2020-04-13 LAB — CERVICOVAGINAL ANCILLARY ONLY
Bacterial Vaginitis (gardnerella): POSITIVE — AB
Candida Glabrata: NEGATIVE
Candida Vaginitis: NEGATIVE
Chlamydia: NEGATIVE
Comment: NEGATIVE
Comment: NEGATIVE
Comment: NEGATIVE
Comment: NEGATIVE
Comment: NEGATIVE
Comment: NORMAL
Neisseria Gonorrhea: NEGATIVE
Trichomonas: NEGATIVE

## 2020-04-13 MED ORDER — TINIDAZOLE 500 MG PO TABS: 1000.0000 mg | ORAL_TABLET | Freq: Every day | ORAL | 2 refills | Status: AC

## 2020-04-13 NOTE — Telephone Encounter (Signed)
Called to advise of results and rx, no answer, left vm ?

## 2020-04-13 NOTE — Telephone Encounter (Signed)
S/w pt and advised of results and rx 

## 2020-04-23 ENCOUNTER — Ambulatory Visit (INDEPENDENT_AMBULATORY_CARE_PROVIDER_SITE_OTHER): Payer: Medicaid Other

## 2020-04-23 ENCOUNTER — Encounter: Payer: Self-pay | Admitting: Podiatry

## 2020-04-23 ENCOUNTER — Ambulatory Visit (INDEPENDENT_AMBULATORY_CARE_PROVIDER_SITE_OTHER): Payer: Medicaid Other | Admitting: Podiatry

## 2020-04-23 ENCOUNTER — Other Ambulatory Visit: Payer: Self-pay

## 2020-04-23 DIAGNOSIS — M7662 Achilles tendinitis, left leg: Secondary | ICD-10-CM | POA: Diagnosis not present

## 2020-04-27 ENCOUNTER — Encounter: Payer: Self-pay | Admitting: Podiatry

## 2020-04-27 NOTE — Progress Notes (Signed)
Subjective:  Patient ID: [name], female    DOB: [birth year],  MRN: [id number]  Chief Complaint  Patient presents with  . Tendonitis    np/achilles tendonitis L/    59 y.o. female presents with the above complaint.  Patient presents with the pain to the left posterior Achilles tendon.  Patient states the pain has been going on for past 3 months has gotten really worse.  Patient has that there is ankle instability because of the compensation that she is making to the left foot.  Patient is a hurts constantly when she stands on it.  Is okay when she is resting.  She denies any other treatment options.  She has not seen anyone else prior to seeing me.  She has tried soaking it resting it taking an anti-inflammatory but none of that has helped.  Pain scale 7 out of 10.  Is sharp shooting in nature   Review of Systems: Negative except as noted in the HPI. Denies N/V/F/Ch.  Past Medical History:  Diagnosis Date  . Anemia   . Anxiety   . Arthritis   . Bipolar 1 disorder (HCC)   . Bursitis    hx of  . Chronic bronchitis (HCC)   . Complication of anesthesia    difficult to wake up and blood pressure drops  . Depression   . Diverticula of intestine   . Dysrhythmia    no medication treatment  . Eczema   . GERD (gastroesophageal reflux disease)   . H. pylori infection   . Headache(784.0)   . Heart murmur   . Hypertension    sees Dr. Shana Chute, is patients primary  . Hypothyroidism    takes synthroid  . Mental disorder    bipolar  . Pneumonia    hx    Current Outpatient Medications:  .  albuterol (PROVENTIL HFA;VENTOLIN HFA) 108 (90 Base) MCG/ACT inhaler, Inhale 2 puffs into the lungs every 4 (four) hours as needed for wheezing or shortness of breath., Disp: 1 Inhaler, Rfl: 0 .  ALPRAZolam (XANAX) 0.25 MG tablet, alprazolam 0.25 mg tablet  Take 1 tablet 3 times a day by oral route as needed., Disp: , Rfl:  .  amLODipine (NORVASC) 10 MG tablet, Take 1 tablet (10 mg total) by  mouth daily. Take this instead of your LOTREL until you see your regular doctor: For high blood pressure (Patient taking differently: Take 10 mg by mouth daily. ), Disp: 1 tablet, Rfl: 0 .  atenolol (TENORMIN) 50 MG tablet, Take 1 tablet (50 mg total) by mouth daily. Need Dr appt for future refills: For high blood pressure (Patient taking differently: Take 50 mg by mouth daily. ), Disp: 1 tablet, Rfl: 0 .  atorvastatin (LIPITOR) 10 MG tablet, Take 10 mg by mouth daily., Disp: , Rfl:  .  Butenafine HCl 1 % cream, Apply topically daily., Disp: , Rfl:  .  citalopram (CELEXA) 20 MG tablet, Take 20 mg by mouth daily as needed (depression/anxiety)., Disp: , Rfl:  .  clorazepate (TRANXENE) 7.5 MG tablet, Take by mouth., Disp: , Rfl:  .  cycloSPORINE (RESTASIS) 0.05 % ophthalmic emulsion, Restasis 0.05 % eye drops in a dropperette  INSTILL 1 DROP INTO AFFECTED EYE(S) BY OPHTHALMIC ROUTE EVERY 12 HOURS as needed, Disp: , Rfl:  .  fluticasone (FLONASE) 50 MCG/ACT nasal spray, fluticasone propionate 50 mcg/actuation nasal spray,suspension  INSTILL 1 SPRAY(S) EVERY DAY BY INTRANASAL ROUTE IN THE MORNING., Disp: , Rfl:  .  hydrochlorothiazide (MICROZIDE)  12.5 MG capsule, Take 12.5 mg by mouth daily., Disp: , Rfl:  .  ibuprofen (ADVIL,MOTRIN) 800 MG tablet, Take 1 tablet (800 mg total) by mouth 3 (three) times daily with meals as needed for headache or moderate pain., Disp: 30 tablet, Rfl: 3 .  ibuprofen (ADVIL,MOTRIN) 800 MG tablet, TAKE 1 TABLET (800 MG TOTAL) BY MOUTH EVERY EIGHT HOURS AS NEEDED., Disp: 30 tablet, Rfl: 5 .  levothyroxine (SYNTHROID, LEVOTHROID) 125 MCG tablet, Take 1 tablet (125 mcg total) by mouth daily. For hypothyroidism, Disp: 1 tablet, Rfl: 0 .  loratadine (CLARITIN) 10 MG tablet, loratadine 10 mg tablet  TAKE 1 TABLET BY MOUTH DAILY IN THE EVENING, Disp: , Rfl:  .  neomycin-bacitracin-polymyxin (NEOSPORIN) OINT, Apply 1 application topically 2 (two) times daily as needed (for skin irritation  on hands).  (Patient not taking: Reported on 04/12/2020), Disp: , Rfl:  .  Olopatadine HCl 0.2 % SOLN, olopatadine 0.2 % eye drops  INSTILL 1 DROP INTO AFFECTED EYE(S) BY OPHTHALMIC ROUTE ONCE DAILY, Disp: , Rfl:  .  ondansetron (ZOFRAN) 4 MG tablet, Take 1 tablet (4 mg total) by mouth every 6 (six) hours. (Patient not taking: Reported on 04/12/2020), Disp: 12 tablet, Rfl: 0 .  Oxycodone HCl 10 MG TABS, Take 10 mg by mouth every 6 (six) hours as needed., Disp: , Rfl:  .  oxyCODONE-acetaminophen (PERCOCET) 10-325 MG tablet, Take 1 tablet by mouth every 4 (four) hours as needed for pain., Disp: 30 tablet, Rfl: 0 .  polycarbophil (FIBERCON) 625 MG tablet, Take 1 tablet (625 mg total) by mouth daily as needed (for regularity). Constipation, Disp: , Rfl:  .  polyethylene glycol (MIRALAX / GLYCOLAX) packet, Take 17 g by mouth daily as needed for mild constipation., Disp: 14 each, Rfl: 0 .  QUEtiapine (SEROQUEL) 25 MG tablet, Seroquel 25 mg tablet  Take 1 tablet(s) every day by oral route at bedtime., Disp: , Rfl:  .  tinidazole (TINDAMAX) 500 MG tablet, Take 2 tablets (1,000 mg total) by mouth daily with breakfast., Disp: 10 tablet, Rfl: 2 .  tiotropium (SPIRIVA HANDIHALER) 18 MCG inhalation capsule, Spiriva with HandiHaler 18 mcg and inhalation capsules  Inhale 1 capsule every day by inhalation route for 30 days., Disp: , Rfl:  .  traMADol (ULTRAM) 50 MG tablet, Ultram 50 mg tablet  Take 1 tablet every 6 hours by oral route as needed for 5 days., Disp: , Rfl:  .  triamcinolone ointment (KENALOG) 0.1 %, SMARTSIG:Sparingly Topical Twice Daily, Disp: , Rfl:   Social History   Tobacco Use  Smoking Status Current Every Day Smoker  . Packs/day: 0.25  . Years: 20.00  . Pack years: 5.00  . Types: Cigarettes  Smokeless Tobacco Never Used  Tobacco Comment   4-6 cigs/day    Allergies  Allergen Reactions  . Benazepril Anaphylaxis, Hives and Itching  . Penicillins Hives, Itching and Swelling    Face:  itching/swelling Has patient had a PCN reaction causing immediate rash, facial/tongue/throat swelling, SOB or lightheadedness with hypotension: Yes Has patient had a PCN reaction causing severe rash involving mucus membranes or skin necrosis: Unk Has patient had a PCN reaction that required hospitalization: No Has patient had a PCN reaction occurring within the last 10 years: No If all of the above answers are "NO", then may proceed with Cephalosporin use.   . Sulfa Antibiotics Hives    Itching and facial swelling  . Ciprofloxacin Hcl Hives    "funny sensation in gums"  .  Clindamycin/Lincomycin Hives and Itching    Welts (also)  . Erythromycin Hives and Itching  . Hydrocodone-Acetaminophen Hives  . Other     Pt has problems with being awaken from anesthesia, Her blood pressure drops very low  . Zithromax [Azithromycin] Hives and Itching  . Sulfamethoxazole Rash  . Tape Itching and Rash   Objective:  There were no vitals filed for this visit. There is no height or weight on file to calculate BMI. Constitutional Well developed. Well nourished.  Vascular Dorsalis pedis pulses palpable bilaterally. Posterior tibial pulses palpable bilaterally. Capillary refill normal to all digits.  No cyanosis or clubbing noted. Pedal hair growth normal.  Neurologic Normal speech. Oriented to person, place, and time. Epicritic sensation to light touch grossly present bilaterally.  Dermatologic Nails well groomed and normal in appearance. No open wounds. No skin lesions.  Orthopedic:  Pain on palpation to the left Achilles insertion.  Pain with dorsiflexion of the tendon.  No pain with plantarflexion of the ankle.  These findings were consistent with both active and passive range of motion.  No pain at the posterior tibial tendon, ATFL, peroneal tendon.   Radiographs: 3 views of skeletally mature adult left foot: Posterior heel spurring noted.  Bunion deformity present.  Midfoot arthritis noted.   No plantar heel spurring noted.  Ankle and subtalar joint within normal limits.  Assessment:   1. Tendonitis, Achilles, left    Plan:  Patient was evaluated and treated and all questions answered.  Left Achilles tendinitis -I explained patient the etiology of tendinitis and various treatment options were extensively discussed.  Given that she is having clinically a lot of pain without any radiographic fractures I believe patient will benefit a cam boot immobilization allow the Achilles to heal properly.  If there is no improvement will consider steroid injection at that time.  However for now I believe immobilization will help her considerably.  Patient agrees with the plan.  I also discussed with her shoe gear modification as well.  Patient states understanding. -Prescription for [employer] was given for cam boot.  Patient will obtain the right away and place her self in it.  No follow-ups on file.

## 2020-05-21 ENCOUNTER — Other Ambulatory Visit: Payer: Self-pay

## 2020-05-21 ENCOUNTER — Encounter: Payer: Self-pay | Admitting: Podiatry

## 2020-05-21 ENCOUNTER — Ambulatory Visit (INDEPENDENT_AMBULATORY_CARE_PROVIDER_SITE_OTHER): Payer: Medicaid Other | Admitting: Podiatry

## 2020-05-21 DIAGNOSIS — M7662 Achilles tendinitis, left leg: Secondary | ICD-10-CM

## 2020-05-21 NOTE — Progress Notes (Signed)
Subjective:  Patient ID: [name], female    DOB: 1960-09-12,  MRN: [id number]  Chief Complaint  Patient presents with  . Follow-up    left foot tendonitis 4wk. reports numbness, tingling and pain    59 y.o. female presents with the above complaint.  Patient presents with follow-up of left posterior Achilles tendinitis.  Patient states she is doing a lot better.  She states is improved greater than 80%.  She has not been able to tolerate the cam boot well.  She would like to know if she can transition out of the boot.  She denies any other acute complaints.   Review of Systems: Negative except as noted in the HPI. Denies N/V/F/Ch.  Past Medical History:  Diagnosis Date  . Anemia   . Anxiety   . Arthritis   . Bipolar 1 disorder (HCC)   . Bursitis    hx of  . Chronic bronchitis (HCC)   . Complication of anesthesia    difficult to wake up and blood pressure drops  . Depression   . Diverticula of intestine   . Dysrhythmia    no medication treatment  . Eczema   . GERD (gastroesophageal reflux disease)   . H. pylori infection   . Headache(784.0)   . Heart murmur   . Hypertension    sees Dr. Shana Chute, is patients primary  . Hypothyroidism    takes synthroid  . Mental disorder    bipolar  . Pneumonia    hx    Current Outpatient Medications:  .  albuterol (PROVENTIL HFA;VENTOLIN HFA) 108 (90 Base) MCG/ACT inhaler, Inhale 2 puffs into the lungs every 4 (four) hours as needed for wheezing or shortness of breath., Disp: 1 Inhaler, Rfl: 0 .  ALPRAZolam (XANAX) 0.25 MG tablet, alprazolam 0.25 mg tablet  Take 1 tablet 3 times a day by oral route as needed., Disp: , Rfl:  .  amLODipine (NORVASC) 10 MG tablet, Take 1 tablet (10 mg total) by mouth daily. Take this instead of your LOTREL until you see your regular doctor: For high blood pressure (Patient taking differently: Take 10 mg by mouth daily. ), Disp: 1 tablet, Rfl: 0 .  atenolol (TENORMIN) 50 MG tablet, Take 1 tablet (50  mg total) by mouth daily. Need Dr appt for future refills: For high blood pressure (Patient taking differently: Take 50 mg by mouth daily. ), Disp: 1 tablet, Rfl: 0 .  atorvastatin (LIPITOR) 10 MG tablet, Take 10 mg by mouth daily., Disp: , Rfl:  .  Butenafine HCl 1 % cream, Apply topically daily., Disp: , Rfl:  .  citalopram (CELEXA) 20 MG tablet, Take 20 mg by mouth daily as needed (depression/anxiety)., Disp: , Rfl:  .  clorazepate (TRANXENE) 7.5 MG tablet, Take by mouth., Disp: , Rfl:  .  cycloSPORINE (RESTASIS) 0.05 % ophthalmic emulsion, Restasis 0.05 % eye drops in a dropperette  INSTILL 1 DROP INTO AFFECTED EYE(S) BY OPHTHALMIC ROUTE EVERY 12 HOURS as needed, Disp: , Rfl:  .  fluticasone (FLONASE) 50 MCG/ACT nasal spray, fluticasone propionate 50 mcg/actuation nasal spray,suspension  INSTILL 1 SPRAY(S) EVERY DAY BY INTRANASAL ROUTE IN THE MORNING., Disp: , Rfl:  .  hydrochlorothiazide (MICROZIDE) 12.5 MG capsule, Take 12.5 mg by mouth daily., Disp: , Rfl:  .  ibuprofen (ADVIL,MOTRIN) 800 MG tablet, Take 1 tablet (800 mg total) by mouth 3 (three) times daily with meals as needed for headache or moderate pain., Disp: 30 tablet, Rfl: 3 .  ibuprofen (  ADVIL,MOTRIN) 800 MG tablet, TAKE 1 TABLET (800 MG TOTAL) BY MOUTH EVERY EIGHT HOURS AS NEEDED., Disp: 30 tablet, Rfl: 5 .  levothyroxine (SYNTHROID, LEVOTHROID) 125 MCG tablet, Take 1 tablet (125 mcg total) by mouth daily. For hypothyroidism, Disp: 1 tablet, Rfl: 0 .  loratadine (CLARITIN) 10 MG tablet, loratadine 10 mg tablet  TAKE 1 TABLET BY MOUTH DAILY IN THE EVENING, Disp: , Rfl:  .  neomycin-bacitracin-polymyxin (NEOSPORIN) OINT, Apply 1 application topically 2 (two) times daily as needed (for skin irritation on hands).  (Patient not taking: Reported on 04/12/2020), Disp: , Rfl:  .  Olopatadine HCl 0.2 % SOLN, olopatadine 0.2 % eye drops  INSTILL 1 DROP INTO AFFECTED EYE(S) BY OPHTHALMIC ROUTE ONCE DAILY, Disp: , Rfl:  .  ondansetron (ZOFRAN) 4 MG  tablet, Take 1 tablet (4 mg total) by mouth every 6 (six) hours. (Patient not taking: Reported on 04/12/2020), Disp: 12 tablet, Rfl: 0 .  Oxycodone HCl 10 MG TABS, Take 10 mg by mouth every 6 (six) hours as needed., Disp: , Rfl:  .  oxyCODONE-acetaminophen (PERCOCET) 10-325 MG tablet, Take 1 tablet by mouth every 4 (four) hours as needed for pain., Disp: 30 tablet, Rfl: 0 .  polycarbophil (FIBERCON) 625 MG tablet, Take 1 tablet (625 mg total) by mouth daily as needed (for regularity). Constipation, Disp: , Rfl:  .  polyethylene glycol (MIRALAX / GLYCOLAX) packet, Take 17 g by mouth daily as needed for mild constipation., Disp: 14 each, Rfl: 0 .  QUEtiapine (SEROQUEL) 25 MG tablet, Seroquel 25 mg tablet  Take 1 tablet(s) every day by oral route at bedtime., Disp: , Rfl:  .  tinidazole (TINDAMAX) 500 MG tablet, Take 2 tablets (1,000 mg total) by mouth daily with breakfast., Disp: 10 tablet, Rfl: 2 .  tiotropium (SPIRIVA HANDIHALER) 18 MCG inhalation capsule, Spiriva with HandiHaler 18 mcg and inhalation capsules  Inhale 1 capsule every day by inhalation route for 30 days., Disp: , Rfl:  .  traMADol (ULTRAM) 50 MG tablet, Ultram 50 mg tablet  Take 1 tablet every 6 hours by oral route as needed for 5 days., Disp: , Rfl:  .  triamcinolone ointment (KENALOG) 0.1 %, SMARTSIG:Sparingly Topical Twice Daily, Disp: , Rfl:   Social History   Tobacco Use  Smoking Status Current Every Day Smoker  . Packs/day: 0.25  . Years: 20.00  . Pack years: 5.00  . Types: Cigarettes  Smokeless Tobacco Never Used  Tobacco Comment   4-6 cigs/day    Allergies  Allergen Reactions  . Benazepril Anaphylaxis, Hives and Itching  . Penicillins Hives, Itching and Swelling    Face: itching/swelling Has patient had a PCN reaction causing immediate rash, facial/tongue/throat swelling, SOB or lightheadedness with hypotension: Yes Has patient had a PCN reaction causing severe rash involving mucus membranes or skin necrosis:  Unk Has patient had a PCN reaction that required hospitalization: No Has patient had a PCN reaction occurring within the last 10 years: No If all of the above answers are "NO", then may proceed with Cephalosporin use.   . Sulfa Antibiotics Hives    Itching and facial swelling  . Ciprofloxacin Hcl Hives    "funny sensation in gums"  . Clindamycin/Lincomycin Hives and Itching    Welts (also)  . Erythromycin Hives and Itching  . Hydrocodone-Acetaminophen Hives  . Other     Pt has problems with being awaken from anesthesia, Her blood pressure drops very low  . Zithromax [Azithromycin] Hives and Itching  .  Sulfamethoxazole Rash  . Tape Itching and Rash   Objective:  There were no vitals filed for this visit. There is no height or weight on file to calculate BMI. Constitutional Well developed. Well nourished.  Vascular Dorsalis pedis pulses palpable bilaterally. Posterior tibial pulses palpable bilaterally. Capillary refill normal to all digits.  No cyanosis or clubbing noted. Pedal hair growth normal.  Neurologic Normal speech. Oriented to person, place, and time. Epicritic sensation to light touch grossly present bilaterally.  Dermatologic Nails well groomed and normal in appearance. No open wounds. No skin lesions.  Orthopedic:  Mild residual pain on palpation to the left Achilles insertion.  Mild residual pain with dorsiflexion of the tendon.  No pain with plantarflexion of the ankle.  These findings were consistent with both active and passive range of motion.  No pain at the posterior tibial tendon, ATFL, peroneal tendon.   Radiographs: 3 views of skeletally mature adult left foot: Posterior heel spurring noted.  Bunion deformity present.  Midfoot arthritis noted.  No plantar heel spurring noted.  Ankle and subtalar joint within normal limits.  Assessment:   1. Tendonitis, Achilles, left    Plan:  Patient was evaluated and treated and all questions answered.  Left  Achilles tendinitis -Patient will now transition out of the boot into regular shoes with a Tri-Lock ankle brace.  I discussed with the patient that wear the boot for now and then use the brace and the regular shoes to transition out of the boot by breaking it and slowly.  Patient states understanding and will do so. -If there is any acute issues where she is not able to transition out of it without pain I have asked her to come back and see me. -Tri-Lock ankle brace was dispensed.  No follow-ups on file.

## 2020-07-02 ENCOUNTER — Encounter: Payer: Self-pay | Admitting: Podiatry

## 2020-07-02 ENCOUNTER — Other Ambulatory Visit: Payer: Self-pay

## 2020-07-02 ENCOUNTER — Ambulatory Visit: Payer: Medicaid Other | Admitting: Podiatry

## 2020-07-02 DIAGNOSIS — M7662 Achilles tendinitis, left leg: Secondary | ICD-10-CM | POA: Diagnosis not present

## 2020-07-02 NOTE — Progress Notes (Signed)
Subjective:  Patient ID: [name], female    DOB: 10-10-1960,  MRN: [id number]  Chief Complaint  Patient presents with  . Foot Pain    Left foot pain. PT stated that she is doing better she has no concerns at this time and pain is little to none     59 y.o. female presents with the above complaint.  Patient presents with follow-up of left posterior Achilles tendinitis.  Patient states that she is doing 100% better.  She no longer has any pain.  She has been utilizing the brace which has helped a lot.  She denies any other acute complaints.  She does not wear her cam boot anymore.   Review of Systems: Negative except as noted in the HPI. Denies N/V/F/Ch.  Past Medical History:  Diagnosis Date  . Anemia   . Anxiety   . Arthritis   . Bipolar 1 disorder (HCC)   . Bursitis    hx of  . Chronic bronchitis (HCC)   . Complication of anesthesia    difficult to wake up and blood pressure drops  . Depression   . Diverticula of intestine   . Dysrhythmia    no medication treatment  . Eczema   . GERD (gastroesophageal reflux disease)   . H. pylori infection   . Headache(784.0)   . Heart murmur   . Hypertension    sees Dr. Shana Chute, is patients primary  . Hypothyroidism    takes synthroid  . Mental disorder    bipolar  . Pneumonia    hx    Current Outpatient Medications:  .  albuterol (PROVENTIL HFA;VENTOLIN HFA) 108 (90 Base) MCG/ACT inhaler, Inhale 2 puffs into the lungs every 4 (four) hours as needed for wheezing or shortness of breath., Disp: 1 Inhaler, Rfl: 0 .  ALPRAZolam (XANAX) 0.25 MG tablet, alprazolam 0.25 mg tablet  Take 1 tablet 3 times a day by oral route as needed., Disp: , Rfl:  .  amLODipine (NORVASC) 10 MG tablet, Take 1 tablet (10 mg total) by mouth daily. Take this instead of your LOTREL until you see your regular doctor: For high blood pressure (Patient taking differently: Take 10 mg by mouth daily. ), Disp: 1 tablet, Rfl: 0 .  atenolol (TENORMIN) 50 MG  tablet, Take 1 tablet (50 mg total) by mouth daily. Need Dr appt for future refills: For high blood pressure (Patient taking differently: Take 50 mg by mouth daily. ), Disp: 1 tablet, Rfl: 0 .  atorvastatin (LIPITOR) 10 MG tablet, Take 10 mg by mouth daily., Disp: , Rfl:  .  Butenafine HCl 1 % cream, Apply topically daily., Disp: , Rfl:  .  citalopram (CELEXA) 20 MG tablet, Take 20 mg by mouth daily as needed (depression/anxiety)., Disp: , Rfl:  .  clorazepate (TRANXENE) 7.5 MG tablet, Take by mouth., Disp: , Rfl:  .  cycloSPORINE (RESTASIS) 0.05 % ophthalmic emulsion, Restasis 0.05 % eye drops in a dropperette  INSTILL 1 DROP INTO AFFECTED EYE(S) BY OPHTHALMIC ROUTE EVERY 12 HOURS as needed, Disp: , Rfl:  .  fluticasone (FLONASE) 50 MCG/ACT nasal spray, fluticasone propionate 50 mcg/actuation nasal spray,suspension  INSTILL 1 SPRAY(S) EVERY DAY BY INTRANASAL ROUTE IN THE MORNING., Disp: , Rfl:  .  hydrochlorothiazide (MICROZIDE) 12.5 MG capsule, Take 12.5 mg by mouth daily., Disp: , Rfl:  .  ibuprofen (ADVIL,MOTRIN) 800 MG tablet, Take 1 tablet (800 mg total) by mouth 3 (three) times daily with meals as needed for headache or  moderate pain., Disp: 30 tablet, Rfl: 3 .  ibuprofen (ADVIL,MOTRIN) 800 MG tablet, TAKE 1 TABLET (800 MG TOTAL) BY MOUTH EVERY EIGHT HOURS AS NEEDED., Disp: 30 tablet, Rfl: 5 .  levothyroxine (SYNTHROID, LEVOTHROID) 125 MCG tablet, Take 1 tablet (125 mcg total) by mouth daily. For hypothyroidism, Disp: 1 tablet, Rfl: 0 .  loratadine (CLARITIN) 10 MG tablet, loratadine 10 mg tablet  TAKE 1 TABLET BY MOUTH DAILY IN THE EVENING, Disp: , Rfl:  .  neomycin-bacitracin-polymyxin (NEOSPORIN) OINT, Apply 1 application topically 2 (two) times daily as needed (for skin irritation on hands).  (Patient not taking: Reported on 04/12/2020), Disp: , Rfl:  .  Olopatadine HCl 0.2 % SOLN, olopatadine 0.2 % eye drops  INSTILL 1 DROP INTO AFFECTED EYE(S) BY OPHTHALMIC ROUTE ONCE DAILY, Disp: , Rfl:  .   ondansetron (ZOFRAN) 4 MG tablet, Take 1 tablet (4 mg total) by mouth every 6 (six) hours. (Patient not taking: Reported on 04/12/2020), Disp: 12 tablet, Rfl: 0 .  Oxycodone HCl 10 MG TABS, Take 10 mg by mouth every 6 (six) hours as needed., Disp: , Rfl:  .  oxyCODONE-acetaminophen (PERCOCET) 10-325 MG tablet, Take 1 tablet by mouth every 4 (four) hours as needed for pain., Disp: 30 tablet, Rfl: 0 .  polycarbophil (FIBERCON) 625 MG tablet, Take 1 tablet (625 mg total) by mouth daily as needed (for regularity). Constipation, Disp: , Rfl:  .  polyethylene glycol (MIRALAX / GLYCOLAX) packet, Take 17 g by mouth daily as needed for mild constipation., Disp: 14 each, Rfl: 0 .  QUEtiapine (SEROQUEL) 25 MG tablet, Seroquel 25 mg tablet  Take 1 tablet(s) every day by oral route at bedtime., Disp: , Rfl:  .  tinidazole (TINDAMAX) 500 MG tablet, Take 2 tablets (1,000 mg total) by mouth daily with breakfast., Disp: 10 tablet, Rfl: 2 .  tiotropium (SPIRIVA HANDIHALER) 18 MCG inhalation capsule, Spiriva with HandiHaler 18 mcg and inhalation capsules  Inhale 1 capsule every day by inhalation route for 30 days., Disp: , Rfl:  .  traMADol (ULTRAM) 50 MG tablet, Ultram 50 mg tablet  Take 1 tablet every 6 hours by oral route as needed for 5 days., Disp: , Rfl:  .  triamcinolone ointment (KENALOG) 0.1 %, SMARTSIG:Sparingly Topical Twice Daily, Disp: , Rfl:   Social History   Tobacco Use  Smoking Status Current Every Day Smoker  . Packs/day: 0.25  . Years: 20.00  . Pack years: 5.00  . Types: Cigarettes  Smokeless Tobacco Never Used  Tobacco Comment   4-6 cigs/day    Allergies  Allergen Reactions  . Benazepril Anaphylaxis, Hives and Itching  . Penicillins Hives, Itching and Swelling    Face: itching/swelling Has patient had a PCN reaction causing immediate rash, facial/tongue/throat swelling, SOB or lightheadedness with hypotension: Yes Has patient had a PCN reaction causing severe rash involving mucus  membranes or skin necrosis: Unk Has patient had a PCN reaction that required hospitalization: No Has patient had a PCN reaction occurring within the last 10 years: No If all of the above answers are "NO", then may proceed with Cephalosporin use.   . Sulfa Antibiotics Hives    Itching and facial swelling  . Ciprofloxacin Hcl Hives    "funny sensation in gums"  . Clindamycin/Lincomycin Hives and Itching    Welts (also)  . Erythromycin Hives and Itching  . Hydrocodone-Acetaminophen Hives  . Other     Pt has problems with being awaken from anesthesia, Her blood pressure drops very  low  . Zithromax [Azithromycin] Hives and Itching  . Sulfamethoxazole Rash  . Tape Itching and Rash   Objective:  There were no vitals filed for this visit. There is no height or weight on file to calculate BMI. Constitutional Well developed. Well nourished.  Vascular Dorsalis pedis pulses palpable bilaterally. Posterior tibial pulses palpable bilaterally. Capillary refill normal to all digits.  No cyanosis or clubbing noted. Pedal hair growth normal.  Neurologic Normal speech. Oriented to person, place, and time. Epicritic sensation to light touch grossly present bilaterally.  Dermatologic Nails well groomed and normal in appearance. No open wounds. No skin lesions.  Orthopedic:  No residual pain on palpation to the left Achilles insertion.  No residual pain with dorsiflexion of the tendon.  No pain with plantarflexion of the ankle.  These findings were consistent with both active and passive range of motion.  No pain at the posterior tibial tendon, ATFL, peroneal tendon.   Radiographs: 3 views of skeletally mature adult left foot: Posterior heel spurring noted.  Bunion deformity present.  Midfoot arthritis noted.  No plantar heel spurring noted.  Ankle and subtalar joint within normal limits.  Assessment:   1. Tendonitis, Achilles, left    Plan:  Patient was evaluated and treated and all questions  answered.  Left Achilles tendinitis -Clinically patient no longer has pain.  She has successfully transition out of the cam boot into regular sneakers with a Tri-Lock ankle brace.  She still is wearing Tri-Lock ankle brace as needed.  I encouraged her that she can over eventually discontinue the use of it.  She states understanding.  I have asked her if any foot and ankle issues arise including recurrence of the Achilles tendinitis to come back and see me.  Patient states understanding.  No follow-ups on file.

## 2020-09-28 ENCOUNTER — Encounter (HOSPITAL_COMMUNITY): Payer: Self-pay

## 2020-09-28 ENCOUNTER — Emergency Department (HOSPITAL_COMMUNITY)
Admission: EM | Admit: 2020-09-28 | Discharge: 2020-09-29 | Disposition: A | Discharge status: Home / Self Care | Payer: Medicaid Other | Attending: Emergency Medicine | Admitting: Emergency Medicine

## 2020-09-28 ENCOUNTER — Emergency Department (HOSPITAL_COMMUNITY): Payer: Medicaid Other

## 2020-09-28 ENCOUNTER — Other Ambulatory Visit: Payer: Self-pay

## 2020-09-28 ENCOUNTER — Ambulatory Visit (HOSPITAL_COMMUNITY)
Admission: EM | Admit: 2020-09-28 | Discharge: 2020-09-28 | Disposition: A | Discharge status: Home / Self Care | Payer: Medicaid Other | Attending: Student | Admitting: Student

## 2020-09-28 DIAGNOSIS — J3089 Other allergic rhinitis: Secondary | ICD-10-CM

## 2020-09-28 DIAGNOSIS — R42 Dizziness and giddiness: Secondary | ICD-10-CM

## 2020-09-28 DIAGNOSIS — Z79899 Other long term (current) drug therapy: Secondary | ICD-10-CM | POA: Diagnosis not present

## 2020-09-28 DIAGNOSIS — I10 Essential (primary) hypertension: Secondary | ICD-10-CM

## 2020-09-28 DIAGNOSIS — F17210 Nicotine dependence, cigarettes, uncomplicated: Secondary | ICD-10-CM | POA: Insufficient documentation

## 2020-09-28 DIAGNOSIS — E039 Hypothyroidism, unspecified: Secondary | ICD-10-CM | POA: Insufficient documentation

## 2020-09-28 DIAGNOSIS — E876 Hypokalemia: Secondary | ICD-10-CM | POA: Diagnosis not present

## 2020-09-28 DIAGNOSIS — R9431 Abnormal electrocardiogram [ECG] [EKG]: Secondary | ICD-10-CM | POA: Diagnosis not present

## 2020-09-28 LAB — BASIC METABOLIC PANEL
Anion gap: 14 (ref 5–15)
BUN: 9 mg/dL (ref 8–23)
CO2: 23 mmol/L (ref 22–32)
Calcium: 9.6 mg/dL (ref 8.9–10.3)
Chloride: 98 mmol/L (ref 98–111)
Creatinine, Ser: 0.93 mg/dL (ref 0.44–1.00)
GFR, Estimated: >60 mL/min (ref >60)
Glucose, Bld: 124 mg/dL — ABNORMAL HIGH (ref 70–99)
Potassium: 3.1 mmol/L — ABNORMAL LOW (ref 3.5–5.1)
Sodium: 135 mmol/L (ref 135–145)

## 2020-09-28 LAB — TROPONIN I (HIGH SENSITIVITY)
Troponin I (High Sensitivity): 6 ng/L (ref <18)
Troponin I (High Sensitivity): 7 ng/L (ref <18)

## 2020-09-28 LAB — CBC
HCT: 45.1 % (ref 36.0–46.0)
Hemoglobin: 14.2 g/dL (ref 12.0–15.0)
MCH: 26.7 pg (ref 26.0–34.0)
MCHC: 31.5 g/dL (ref 30.0–36.0)
MCV: 84.8 fL (ref 80.0–100.0)
Platelets: 287 10*3/uL (ref 150–400)
RBC: 5.32 MIL/uL — ABNORMAL HIGH (ref 3.87–5.11)
RDW: 14.8 % (ref 11.5–15.5)
WBC: 5.8 10*3/uL (ref 4.0–10.5)
nRBC: 0 % (ref 0.0–0.2)

## 2020-09-28 LAB — CBG MONITORING, ED: Glucose-Capillary: 109 mg/dL — ABNORMAL HIGH (ref 70–99)

## 2020-09-28 NOTE — ED Triage Notes (Signed)
Patient presented to urgent care for hypertension, reports they told her her BP was okay but she had an abnormal EKG.

## 2020-09-28 NOTE — ED Provider Notes (Addendum)
[facility]    CSN: [id number] Arrival date & time: 09/28/20  1736      History   Chief Complaint Chief Complaint  Patient presents with  . Hypertension    Since yesterday    HPI [name] is a 60 y.o. female presenting with vague complaint of dizziness and blood pressure issues. History heart murmur, dysrhythmia, hyertension, current daily smoker, diverticula, h pylori infection. Presenting today with 3 days of intermittent dizziness.states she has been checking her BP at home and this is running 140s/80s at home.  Has not identified trigger to the dizziness. Denies LOC. Denies chest pain, vision changes, headaches, blood sugar issues. Endorses seasonal allergies with recent exacerbation. States she is currently being treated for h pylori.    HPI  Past Medical History:  Diagnosis Date  . Anemia   . Anxiety   . Arthritis   . Bipolar 1 disorder (HCC)   . Bursitis    hx of  . Chronic bronchitis (HCC)   . Complication of anesthesia    difficult to wake up and blood pressure drops  . Depression   . Diverticula of intestine   . Dysrhythmia    no medication treatment  . Eczema   . GERD (gastroesophageal reflux disease)   . H. pylori infection   . Headache(784.0)   . Heart murmur   . Hypertension    sees Dr. Shana Chute, is patients primary  . Hypothyroidism    takes synthroid  . Mental disorder    bipolar  . Pneumonia    hx    Patient Active Problem List   Diagnosis Date Noted  . Abnormal weight loss 01/28/2017  . Chronic back pain 01/28/2017  . Loss of appetite 01/28/2017  . Panic attack 01/28/2017  . Bipolar disorder, curr episode mixed, severe, w/o psychotic features (HCC) 04/03/2016  . Alcohol use disorder, severe, dependence (HCC) 03/30/2016  . Alcohol abuse 01/09/2016  . 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 Mass of parotid gland 10/12/2015  . Diverticulitis 08/15/2015  . Diverticulitis of large intestine with abscess   11/23/2014  . Acute diverticulitis 11/23/2014  . Leukocytosis 11/17/2014  . Essential hypertension 11/17/2014  . Heart murmur   . Chronic bronchitis (HCC)   . Hypothyroidism   . Anxiety   . Depression   . Mental disorder   . GERD (gastroesophageal reflux disease)   . Simple chronic bronchitis (HCC)     Past Surgical History:  Procedure Laterality Date  . ABDOMINAL HYSTERECTOMY     has both of ovaries  . CESAREAN SECTION    . COLONOSCOPY    . LUMBAR LAMINECTOMY  08/22/2011   Procedure: MICRODISCECTOMY LUMBAR LAMINECTOMY;  Surgeon: Dorian Heckle, MD;  Location: [facility];  Service: Neurosurgery;  Laterality: Right;  RIGHT Lumbar Five-Sacral One microdiskectomy  . MOUTH SURGERY    . MOUTH SURGERY    . MULTIPLE EXTRACTIONS WITH ALVEOLOPLASTY N/A 08/20/2015   Procedure: Extraction of tooth #'s 1-12,14,20-27, 29 with alveoloplasty;  Surgeon: Charlynne Pander, DDS;  Location: [facility];  Service: Oral Surgery;  Laterality: N/A;  . TUBAL LIGATION      OB History   No obstetric history on file.      Home Medications    Prior to Admission medications   Medication Sig Start Date End Date Taking? Authorizing Provider  albuterol (PROVENTIL HFA;VENTOLIN HFA) 108 (90 Base) MCG/ACT inhaler Inhale 2 puffs into the lungs every 4 (four) hours as needed for wheezing  or shortness of breath. 04/04/16  Yes Armandina Stammer I, NP  amLODipine (NORVASC) 10 MG tablet Take 1 tablet (10 mg total) by mouth daily. Take this instead of your LOTREL until you see your regular doctor: For high blood pressure Patient taking differently: Take 10 mg by mouth daily. 04/04/16  Yes Armandina Stammer I, NP  atenolol (TENORMIN) 50 MG tablet Take 1 tablet (50 mg total) by mouth daily. Need Dr appt for future refills: For high blood pressure Patient taking differently: Take 50 mg by mouth daily. 04/04/16  Yes Armandina Stammer I, NP  atorvastatin (LIPITOR) 10 MG tablet Take 10 mg by mouth daily.   Yes [provider]   citalopram (CELEXA) 20 MG tablet Take 20 mg by mouth daily as needed (depression/anxiety).   Yes [provider]  fluticasone (FLONASE) 50 MCG/ACT nasal spray fluticasone propionate 50 mcg/actuation nasal spray,suspension  INSTILL 1 SPRAY(S) EVERY DAY BY INTRANASAL ROUTE IN THE MORNING.   Yes [provider]  hydrochlorothiazide (MICROZIDE) 12.5 MG capsule Take 12.5 mg by mouth daily.   Yes [provider]  ibuprofen (ADVIL,MOTRIN) 800 MG tablet Take 1 tablet (800 mg total) by mouth 3 (three) times daily with meals as needed for headache or moderate pain. 02/13/17  Yes Brock Bad, MD  ibuprofen (ADVIL,MOTRIN) 800 MG tablet TAKE 1 TABLET (800 MG TOTAL) BY MOUTH EVERY EIGHT HOURS AS NEEDED. 10/26/17  Yes Brock Bad, MD  levothyroxine (SYNTHROID, LEVOTHROID) 125 MCG tablet Take 1 tablet (125 mcg total) by mouth daily. For hypothyroidism 04/04/16  Yes Nwoko, Nicole Kindred I, NP  loratadine (CLARITIN) 10 MG tablet loratadine 10 mg tablet  TAKE 1 TABLET BY MOUTH DAILY IN THE EVENING   Yes [provider]  oxyCODONE-acetaminophen (PERCOCET) 10-325 MG tablet Take 1 tablet by mouth every 4 (four) hours as needed for pain. 02/05/17  Yes Brock Bad, MD  polycarbophil (FIBERCON) 625 MG tablet Take 1 tablet (625 mg total) by mouth daily as needed (for regularity). Constipation 04/04/16  Yes Armandina Stammer I, NP  polyethylene glycol (MIRALAX / GLYCOLAX) packet Take 17 g by mouth daily as needed for mild constipation. 04/04/16  Yes Armandina Stammer I, NP  tinidazole (TINDAMAX) 500 MG tablet Take 2 tablets (1,000 mg total) by mouth daily with breakfast. 04/13/20  Yes Brock Bad, MD  ALPRAZolam Prudy Feeler) 0.25 MG tablet alprazolam 0.25 mg tablet  Take 1 tablet 3 times a day by oral route as needed.    [provider]  Butenafine HCl 1 % cream Apply topically daily. 01/29/20   [provider]  clorazepate (TRANXENE) 7.5 MG tablet Take by mouth.    [provider]  cycloSPORINE (RESTASIS) 0.05 % ophthalmic emulsion Restasis 0.05 % eye drops in a dropperette  INSTILL 1 DROP INTO AFFECTED EYE(S) BY OPHTHALMIC ROUTE EVERY 12 HOURS as needed    [provider]  neomycin-bacitracin-polymyxin (NEOSPORIN) OINT Apply 1 application topically 2 (two) times daily as needed (for skin irritation on hands).  Patient not taking: Reported on 04/12/2020    [provider]  Olopatadine HCl 0.2 % SOLN olopatadine 0.2 % eye drops  INSTILL 1 DROP INTO AFFECTED EYE(S) BY OPHTHALMIC ROUTE ONCE DAILY    [provider]  ondansetron (ZOFRAN) 4 MG tablet Take 1 tablet (4 mg total) by mouth every 6 (six) hours. Patient not taking: No sig reported 10/04/16   Tilden Fossa, MD  Oxycodone HCl 10 MG TABS Take 10 mg by mouth every  6 (six) hours as needed. 03/03/20   [provider]  QUEtiapine (SEROQUEL) 25 MG tablet Seroquel 25 mg tablet  Take 1 tablet(s) every day by oral route at bedtime.    [provider]  tiotropium (SPIRIVA HANDIHALER) 18 MCG inhalation capsule Spiriva with HandiHaler 18 mcg and inhalation capsules  Inhale 1 capsule every day by inhalation route for 30 days.    [provider]  traMADol (ULTRAM) 50 MG tablet Ultram 50 mg tablet  Take 1 tablet every 6 hours by oral route as needed for 5 days.    [provider]  triamcinolone ointment (KENALOG) 0.1 % SMARTSIG:Sparingly Topical Twice Daily 12/21/19   [provider]    Family History Family History  Problem Relation Age of Onset  . Anesthesia problems Mother   . Cancer Mother        thyroid  . Hypertension Mother   . Breast cancer Mother [id number]  . Anesthesia problems Sister   . Hypertension Sister   . Cancer Maternal Aunt   . Cancer Maternal Aunt   . Colon cancer Sister   . Stroke Sister   . Hypertension Brother     Social History Social History   Tobacco Use  . Smoking status: Current Every Day Smoker    Packs/day:  0.25    Years: 20.00    Pack years: 5.00    Types: Cigarettes  . Smokeless tobacco: Never Used  . Tobacco comment: 4-6 cigs/day  Vaping Use  . Vaping Use: Never used  Substance Use Topics  . Alcohol use: Yes    Alcohol/week: 0.0 standard drinks    Comment: occ  . Drug use: No     Allergies   Benazepril, Penicillins, Sulfa antibiotics, Ciprofloxacin hcl, Clindamycin/lincomycin, Erythromycin, Hydrocodone-acetaminophen, Other, Zithromax [azithromycin], Sulfamethoxazole, and Tape   Review of Systems Review of Systems  Constitutional: Negative for appetite change, chills and fever.  HENT: Positive for congestion. Negative for ear pain, rhinorrhea, sinus pressure, sinus pain and sore throat.   Eyes: Negative for redness and visual disturbance.  Respiratory: Negative for cough, chest tightness, shortness of breath and wheezing.   Cardiovascular: Negative for chest pain and palpitations.  Gastrointestinal: Negative for abdominal pain, constipation, diarrhea, nausea and vomiting.  Genitourinary: Negative for dysuria, frequency and urgency.  Musculoskeletal: Negative for myalgias.  Neurological: Positive for dizziness. Negative for syncope, weakness, numbness and headaches.  Psychiatric/Behavioral: Negative for confusion.  All other systems reviewed and are negative.    Physical Exam Triage Vital Signs ED Triage Vitals  Enc Vitals Group     BP 09/28/20 1907 (!) 155/78     Pulse Rate 09/28/20 1900 83     Resp 09/28/20 1900 18     Temp 09/28/20 1900 98.2 F (36.8 C)     Temp Source 09/28/20 1900 Oral     SpO2 09/28/20 1900 99 %     Weight --      Height --      Head Circumference --      Peak Flow --      Pain Score 09/28/20 1901 0     Pain Loc --      Pain Edu? --      Excl. in GC? --    Orthostatic VS for the past 24 hrs:  BP- Lying Pulse- Lying BP- Sitting Pulse- Sitting BP- Standing at 0 minutes Pulse- Standing at 0 minutes  09/28/20 1958 151/83 67 150/75 72 129/75  81    Updated Vital  Signs BP (!) 155/78 (BP Location: Right Arm)   Pulse 83   Temp 98.2 F (36.8 C) (Oral)   Resp 18   SpO2 99%   Visual Acuity Right Eye Distance:   Left Eye Distance:   Bilateral Distance:    Right Eye Near:   Left Eye Near:    Bilateral Near:     Physical Exam Vitals reviewed.  Constitutional:      General: She is not in acute distress.    Appearance: Normal appearance. She is not ill-appearing.  HENT:     Head: Normocephalic and atraumatic.  Cardiovascular:     Rate and Rhythm: Normal rate and regular rhythm.     Pulses:          Radial pulses are 2+ on the right side and 2+ on the left side.     Heart sounds: Normal heart sounds.  Pulmonary:     Effort: Pulmonary effort is normal.     Breath sounds: Normal breath sounds. No wheezing, rhonchi or rales.  Musculoskeletal:     Cervical back: Normal range of motion and neck supple. No rigidity.     Right lower leg: No edema.     Left lower leg: No edema.  Lymphadenopathy:     Cervical: No cervical adenopathy.  Neurological:     General: No focal deficit present.     Mental Status: She is alert and oriented to person, place, and time. Mental status is at baseline.     Cranial Nerves: Cranial nerves are intact. No cranial nerve deficit or facial asymmetry.     Sensory: Sensation is intact. No sensory deficit.     Motor: Motor function is intact. No weakness.     Coordination: Coordination is intact. Coordination normal.     Gait: Gait is intact. Gait normal.     Comments: CN 2-12 intact. No weakness or numbness in UEs or LEs.  Psychiatric:        Mood and Affect: Mood normal.        Behavior: Behavior normal.        Thought Content: Thought content normal.        Judgment: Judgment normal.      UC Treatments / Results  Labs (all labs ordered are listed, but only abnormal results are displayed) Labs Reviewed  CBG MONITORING, ED - Abnormal; Notable for the following components:      Result  Value   Glucose-Capillary 109 (*)    All other components within normal limits    EKG   Radiology No results found.  Procedures Procedures (including critical care time)  Medications Ordered in UC Medications - No data to display  Initial Impression / Assessment and Plan / UC Course  I have reviewed the triage vital signs and the nursing notes.  Pertinent labs & imaging results that were available during my care of the patient were reviewed by me and considered in my medical decision making (see chart for details).     Pt with significant cardiac history presenting for dizziness. Denies chest pain, headaches. EKG today changed from 2017 EKG; NSR with possible inferior infarct. CBG 109. Orthostatic vitals wnl.   HEART score:  History: +1 EKG: +1 Age: +1 Risk factors: +2 Initial troponin: n/a due to urgent care setting --> 5; moderate risk cardiovascular event   Patient and I are in agreement that she should head to ED for further cardiac evaluation of dizziness and abnormal EKG. Pt is currently hemodynamically  stable for transport in personal vehicle by family member. If shortness of breath, chest pain, LOC, etc on the way to ED- immediately call EMS.    Spent over 40 minutes obtaining H&P, performing physical, discussing results, treatment plan and plan for follow-up with patient. Patient agrees with plan.    Final Clinical Impressions(s) / UC Diagnoses   Final diagnoses:  Dizziness  Essential hypertension  Nonspecific abnormal electrocardiogram (ECG) (EKG)  Allergic rhinitis due to other allergic trigger, unspecified seasonality     Discharge Instructions     Head straight to [facility]    ED Prescriptions    None     PDMP not reviewed this encounter.   Rhys Martini, PA-C 09/28/20 2029    Rhys Martini, PA-C 09/28/20 2029

## 2020-09-28 NOTE — Discharge Instructions (Addendum)
Head straight to [facility] ED 

## 2020-09-28 NOTE — ED Notes (Signed)
Patient is being discharged from the Urgent Care and sent to the Emergency Department via POV . Per Ignacia Bayley PA, patient is in need of higher level of care due to dizziness. Patient is aware and verbalizes understanding of plan of care.  Vitals:   09/28/20 1900 09/28/20 1907  BP:  (!) 155/78  Pulse: 83   Resp: 18   Temp: 98.2 F (36.8 C)   SpO2: 99%

## 2020-09-28 NOTE — ED Triage Notes (Signed)
Patient states she has been having hypertensive episodes since yesterday. Pt states she is taking her BP medicines as prescribed. Pt is aox4 and Ambulatory.

## 2020-09-29 MED ORDER — POTASSIUM CHLORIDE CRYS ER 20 MEQ PO TBCR
40.0000 meq | EXTENDED_RELEASE_TABLET | Freq: Once | ORAL | Status: AC
  Administered 2020-09-29: 40 meq via ORAL
  Filled 2020-09-29: qty 2

## 2020-09-29 NOTE — Discharge Instructions (Addendum)
Please purchase an earwax removal system as we discussed.  Follow-up with your doctor as needed

## 2020-09-29 NOTE — ED Provider Notes (Addendum)
[facility] EMERGENCY DEPARTMENT Provider Note   CSN: [id number] Arrival date & time: 09/28/20  2034     History Chief Complaint  Patient presents with  . Abnormal ECG  . Hypertension    [name] is a 60 y.o. female.  60 year old female presents with abnormal EKG from her PCPs office.  States that she has been having intermittent dizziness and feeling her ears are full.  Does have a history of cerumen impaction.  Denies any headache.  No associated chest pain or shortness of breath.  No abdominal discomfort.  Denies any syncope or near syncope.  Is asymptomatic at this time.        Past Medical History:  Diagnosis Date  . Anemia   . Anxiety   . Arthritis   . Bipolar 1 disorder (HCC)   . Bursitis    hx of  . Chronic bronchitis (HCC)   . Complication of anesthesia    difficult to wake up and blood pressure drops  . Depression   . Diverticula of intestine   . Dysrhythmia    no medication treatment  . Eczema   . GERD (gastroesophageal reflux disease)   . H. pylori infection   . Headache(784.0)   . Heart murmur   . Hypertension    sees Dr. Shana Chute, is patients primary  . Hypothyroidism    takes synthroid  . Mental disorder    bipolar  . Pneumonia    hx    Patient Active Problem List   Diagnosis Date Noted  . Abnormal weight loss 01/28/2017  . Chronic back pain 01/28/2017  . Loss of appetite 01/28/2017  . Panic attack 01/28/2017  . Bipolar disorder, curr episode mixed, severe, w/o psychotic features (HCC) 04/03/2016  . Alcohol use disorder, severe, dependence (HCC) 03/30/2016  . Alcohol abuse 01/09/2016  . 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 Mass of parotid gland 10/12/2015  . Diverticulitis 08/15/2015  . Diverticulitis of large intestine with abscess  11/23/2014  . Acute diverticulitis 11/23/2014  . Leukocytosis 11/17/2014  . Essential hypertension 11/17/2014  . Heart murmur   . Chronic bronchitis (HCC)   .  Hypothyroidism   . Anxiety   . Depression   . Mental disorder   . GERD (gastroesophageal reflux disease)   . Simple chronic bronchitis (HCC)     Past Surgical History:  Procedure Laterality Date  . ABDOMINAL HYSTERECTOMY     has both of ovaries  . CESAREAN SECTION    . COLONOSCOPY    . LUMBAR LAMINECTOMY  08/22/2011   Procedure: MICRODISCECTOMY LUMBAR LAMINECTOMY;  Surgeon: Dorian Heckle, MD;  Location: [facility];  Service: Neurosurgery;  Laterality: Right;  RIGHT Lumbar Five-Sacral One microdiskectomy  . MOUTH SURGERY    . MOUTH SURGERY    . MULTIPLE EXTRACTIONS WITH ALVEOLOPLASTY N/A 08/20/2015   Procedure: Extraction of tooth #'s 1-12,14,20-27, 29 with alveoloplasty;  Surgeon: Charlynne Pander, DDS;  Location: [facility] OR;  Service: Oral Surgery;  Laterality: N/A;  . TUBAL LIGATION       OB History   No obstetric history on file.     Family History  Problem Relation Age of Onset  . Anesthesia problems Mother   . Cancer Mother        thyroid  . Hypertension Mother   . Breast cancer Mother 35  . Anesthesia problems Sister   . Hypertension Sister   . Cancer Maternal Aunt   . Cancer Maternal Aunt   .  Colon cancer Sister   . Stroke Sister   . Hypertension Brother     Social History   Tobacco Use  . Smoking status: Current Every Day Smoker    Packs/day: 0.25    Years: 20.00    Pack years: 5.00    Types: Cigarettes  . Smokeless tobacco: Never Used  . Tobacco comment: 4-6 cigs/day  Vaping Use  . Vaping Use: Never used  Substance Use Topics  . Alcohol use: Yes    Alcohol/week: 0.0 standard drinks    Comment: occ  . Drug use: No    Home Medications Prior to Admission medications   Medication Sig Start Date End Date Taking? Authorizing Provider  albuterol (PROVENTIL HFA;VENTOLIN HFA) 108 (90 Base) MCG/ACT inhaler Inhale 2 puffs into the lungs every 4 (four) hours as needed for wheezing or shortness of breath. 04/04/16   Armandina Stammer I, NP  ALPRAZolam Prudy Feeler)  0.25 MG tablet alprazolam 0.25 mg tablet  Take 1 tablet 3 times a day by oral route as needed.    [provider]  amLODipine (NORVASC) 10 MG tablet Take 1 tablet (10 mg total) by mouth daily. Take this instead of your LOTREL until you see your regular doctor: For high blood pressure Patient taking differently: Take 10 mg by mouth daily. 04/04/16   Armandina Stammer I, NP  atenolol (TENORMIN) 50 MG tablet Take 1 tablet (50 mg total) by mouth daily. Need Dr appt for future refills: For high blood pressure Patient taking differently: Take 50 mg by mouth daily. 04/04/16   Armandina Stammer I, NP  atorvastatin (LIPITOR) 10 MG tablet Take 10 mg by mouth daily.    [provider]  Butenafine HCl 1 % cream Apply topically daily. 01/29/20   [provider]  citalopram (CELEXA) 20 MG tablet Take 20 mg by mouth daily as needed (depression/anxiety).    [provider]  clorazepate (TRANXENE) 7.5 MG tablet Take by mouth.    [provider]  cycloSPORINE (RESTASIS) 0.05 % ophthalmic emulsion Restasis 0.05 % eye drops in a dropperette  INSTILL 1 DROP INTO AFFECTED EYE(S) BY OPHTHALMIC ROUTE EVERY 12 HOURS as needed    [provider]  fluticasone (FLONASE) 50 MCG/ACT nasal spray fluticasone propionate 50 mcg/actuation nasal spray,suspension  INSTILL 1 SPRAY(S) EVERY DAY BY INTRANASAL ROUTE IN THE MORNING.    [provider]  hydrochlorothiazide (MICROZIDE) 12.5 MG capsule Take 12.5 mg by mouth daily.    [provider]  ibuprofen (ADVIL,MOTRIN) 800 MG tablet Take 1 tablet (800 mg total) by mouth 3 (three) times daily with meals as needed for headache or moderate pain. 02/13/17   Brock Bad, MD  ibuprofen (ADVIL,MOTRIN) 800 MG tablet TAKE 1 TABLET (800 MG TOTAL) BY MOUTH EVERY EIGHT HOURS AS NEEDED. 10/26/17   Brock Bad, MD  levothyroxine (SYNTHROID, LEVOTHROID) 125 MCG tablet Take 1 tablet (125 mcg total) by mouth daily. For hypothyroidism  04/04/16   Armandina Stammer I, NP  loratadine (CLARITIN) 10 MG tablet loratadine 10 mg tablet  TAKE 1 TABLET BY MOUTH DAILY IN THE EVENING    [provider]  neomycin-bacitracin-polymyxin (NEOSPORIN) OINT Apply 1 application topically 2 (two) times daily as needed (for skin irritation on hands).  Patient not taking: Reported on 04/12/2020    [provider]  Olopatadine HCl 0.2 % SOLN olopatadine 0.2 % eye drops  INSTILL 1 DROP INTO AFFECTED EYE(S) BY OPHTHALMIC ROUTE ONCE DAILY    [provider]  ondansetron (ZOFRAN) 4 MG tablet Take 1 tablet (4 mg total) by mouth every 6 (six) hours. Patient not taking: No sig reported 10/04/16   Tilden Fossa, MD  Oxycodone HCl 10 MG TABS Take 10 mg by mouth every 6 (six) hours as needed. 03/03/20   [provider]  oxyCODONE-acetaminophen (PERCOCET) 10-325 MG tablet Take 1 tablet by mouth every 4 (four) hours as needed for pain. 02/05/17   Brock Bad, MD  polycarbophil (FIBERCON) 625 MG tablet Take 1 tablet (625 mg total) by mouth daily as needed (for regularity). Constipation 04/04/16   Armandina Stammer I, NP  polyethylene glycol (MIRALAX / GLYCOLAX) packet Take 17 g by mouth daily as needed for mild constipation. 04/04/16   Armandina Stammer I, NP  QUEtiapine (SEROQUEL) 25 MG tablet Seroquel 25 mg tablet  Take 1 tablet(s) every day by oral route at bedtime.    [provider]  tinidazole (TINDAMAX) 500 MG tablet Take 2 tablets (1,000 mg total) by mouth daily with breakfast. 04/13/20   Brock Bad, MD  tiotropium (SPIRIVA HANDIHALER) 18 MCG inhalation capsule Spiriva with HandiHaler 18 mcg and inhalation capsules  Inhale 1 capsule every day by inhalation route for 30 days.    [provider]  traMADol (ULTRAM) 50 MG tablet Ultram 50 mg tablet  Take 1 tablet every 6 hours by oral route as needed for 5 days.    [provider]  triamcinolone ointment (KENALOG) 0.1 % SMARTSIG:Sparingly Topical Twice Daily  12/21/19   [provider]    Allergies    Benazepril, Penicillins, Sulfa antibiotics, Ciprofloxacin hcl, Clindamycin/lincomycin, Erythromycin, Hydrocodone-acetaminophen, Other, Zithromax [azithromycin], Sulfamethoxazole, and Tape  Review of Systems   Review of Systems  All other systems reviewed and are negative.   Physical Exam Updated Vital Signs BP 134/71 (BP Location: Left Arm)   Pulse 69   Temp 98.6 F (37 C) (Oral)   Resp 20   Ht 1.702 m (5\' 7" )   Wt 113.4 kg   SpO2 99%   BMI 39.16 kg/m   Physical Exam Vitals and nursing note reviewed.  Constitutional:      General: She is not in acute distress.    Appearance: Normal appearance. She is well-developed and well-nourished. She is not toxic-appearing.  HENT:     Head: Normocephalic and atraumatic.     Ears:     Comments: Bilateral cerumen impaction Eyes:     General: Lids are normal.     Extraocular Movements: EOM normal.     Conjunctiva/sclera: Conjunctivae normal.     Pupils: Pupils are equal, round, and reactive to light.  Neck:     Thyroid: No thyroid mass.     Trachea: No tracheal deviation.  Cardiovascular:     Rate and Rhythm: Normal rate and regular rhythm.     Heart sounds: Normal heart sounds. No murmur heard. No gallop.   Pulmonary:     Effort: Pulmonary effort is normal. No respiratory distress.     Breath sounds: Normal breath sounds. No stridor. No decreased breath sounds, wheezing, rhonchi or rales.  Abdominal:     General: Bowel sounds are normal. There is no distension.     Palpations: Abdomen is soft.     Tenderness: There is no abdominal tenderness. There is no CVA tenderness or rebound.  Musculoskeletal:        General: No tenderness or edema. Normal range of motion.     Cervical back: Normal range of motion and neck  supple.  Skin:    General: Skin is warm and dry.     Findings: No abrasion or rash.  Neurological:     General: No focal deficit present.     Mental Status: She  is alert and oriented to person, place, and time.     GCS: GCS eye subscore is 4. GCS verbal subscore is 5. GCS motor subscore is 6.     Cranial Nerves: No cranial nerve deficit.     Sensory: No sensory deficit.     Deep Tendon Reflexes: Strength normal.  Psychiatric:        Mood and Affect: Mood and affect normal.        Speech: Speech normal.        Behavior: Behavior normal.     ED Results / Procedures / Treatments   Labs (all labs ordered are listed, but only abnormal results are displayed) Labs Reviewed  BASIC METABOLIC PANEL - Abnormal; Notable for the following components:      Result Value   Potassium 3.1 (*)    Glucose, Bld 124 (*)    All other components within normal limits  CBC - Abnormal; Notable for the following components:   RBC 5.32 (*)    All other components within normal limits  TROPONIN I (HIGH SENSITIVITY)  TROPONIN I (HIGH SENSITIVITY)    EKG EKG Interpretation  Date/Time:  Tuesday September 28 2020 20:44:37 EST Ventricular Rate:  90 PR Interval:  158 QRS Duration: 96 QT Interval:  304 QTC Calculation: 371 R Axis:   15 Text Interpretation: Normal sinus rhythm Right atrial enlargement Left ventricular hypertrophy with repolarization abnormality ( R in aVL , Sokolow-Lyon , Cornell product ) Abnormal ECG No significant change since last tracing Confirmed by Lorre Nick ([id number]) on 09/29/2020 7:08:42 AM   Radiology DG Chest 2 View  Result Date: 09/28/2020 CLINICAL DATA:  Hypertension EXAM: CHEST - 2 VIEW COMPARISON:  02/26/2017 FINDINGS: The heart size and mediastinal contours are within normal limits. Both lungs are clear. The visualized skeletal structures are unremarkable. IMPRESSION: No active cardiopulmonary disease. Electronically Signed   By: Sharlet Salina M.D.   On: 09/28/2020 21:15    Procedures Procedures   Medications Ordered in ED Medications - No data to display  ED Course  I have reviewed the triage vital signs and the nursing  notes.  Pertinent labs & imaging results that were available during my care of the patient were reviewed by me and considered in my medical decision making (see chart for details).    MDM Rules/Calculators/A&P                          Patient's labs are significant only for mild hypokalemia which was corrected with oral potassium.  Does have evidence of cerumen impaction.  No focal neurological deficits.  EKG without ischemic changes.  Troponin negative.  Will discharge home Final Clinical Impression(s) / ED Diagnoses Final diagnoses:  None    Rx / DC Orders ED Discharge Orders    None       Lorre Nick, MD 09/29/20 Mertie Moores    Lorre Nick, MD 09/29/20 [phone]

## 2020-09-29 NOTE — ED Notes (Signed)
Pt d/c home per MD order. Discharge summary reviewed with pt, pt verbalizes understanding. No s/s of acute distress noted at discharge. Taxi cab called for discharge ride home, per pt request.

## 2020-10-06 ENCOUNTER — Other Ambulatory Visit: Payer: Self-pay | Admitting: Physician Assistant

## 2020-10-06 DIAGNOSIS — Z1231 Encounter for screening mammogram for malignant neoplasm of breast: Secondary | ICD-10-CM

## 2020-10-21 ENCOUNTER — Other Ambulatory Visit: Payer: Self-pay | Admitting: Gastroenterology

## 2020-10-21 DIAGNOSIS — R634 Abnormal weight loss: Secondary | ICD-10-CM

## 2020-10-21 DIAGNOSIS — R1030 Lower abdominal pain, unspecified: Secondary | ICD-10-CM

## 2020-10-21 DIAGNOSIS — Z8719 Personal history of other diseases of the digestive system: Secondary | ICD-10-CM

## 2020-11-08 ENCOUNTER — Ambulatory Visit
Admission: RE | Admit: 2020-11-08 | Discharge: 2020-11-08 | Disposition: A | Discharge status: Home / Self Care | Payer: Medicaid Other | Source: Ambulatory Visit | Attending: Gastroenterology | Admitting: Gastroenterology

## 2020-11-08 DIAGNOSIS — Z8719 Personal history of other diseases of the digestive system: Secondary | ICD-10-CM

## 2020-11-08 DIAGNOSIS — R1030 Lower abdominal pain, unspecified: Secondary | ICD-10-CM

## 2020-11-08 DIAGNOSIS — R634 Abnormal weight loss: Secondary | ICD-10-CM

## 2020-11-08 MED ORDER — IOPAMIDOL (ISOVUE-300) INJECTION 61%
100.0000 mL | Freq: Once | INTRAVENOUS | Status: AC | PRN
  Administered 2020-11-08: 100 mL via INTRAVENOUS

## 2020-11-24 ENCOUNTER — Ambulatory Visit: Payer: Medicaid Other

## 2020-12-10 ENCOUNTER — Other Ambulatory Visit: Payer: Self-pay

## 2020-12-10 ENCOUNTER — Ambulatory Visit
Admission: RE | Admit: 2020-12-10 | Discharge: 2020-12-10 | Disposition: A | Discharge status: Home / Self Care | Payer: Medicaid Other | Source: Ambulatory Visit | Attending: Physician Assistant | Admitting: Physician Assistant

## 2020-12-10 DIAGNOSIS — Z1231 Encounter for screening mammogram for malignant neoplasm of breast: Secondary | ICD-10-CM

## 2021-02-24 ENCOUNTER — Encounter (HOSPITAL_COMMUNITY): Payer: Self-pay | Admitting: Emergency Medicine

## 2021-02-24 ENCOUNTER — Emergency Department (HOSPITAL_COMMUNITY)
Admission: EM | Admit: 2021-02-24 | Discharge: 2021-02-24 | Disposition: A | Discharge status: Home / Self Care | Payer: Medicaid Other | Attending: Emergency Medicine | Admitting: Emergency Medicine

## 2021-02-24 ENCOUNTER — Other Ambulatory Visit: Payer: Self-pay

## 2021-02-24 ENCOUNTER — Emergency Department (HOSPITAL_COMMUNITY): Payer: Medicaid Other

## 2021-02-24 DIAGNOSIS — E039 Hypothyroidism, unspecified: Secondary | ICD-10-CM | POA: Insufficient documentation

## 2021-02-24 DIAGNOSIS — R42 Dizziness and giddiness: Secondary | ICD-10-CM | POA: Diagnosis present

## 2021-02-24 DIAGNOSIS — F17210 Nicotine dependence, cigarettes, uncomplicated: Secondary | ICD-10-CM | POA: Diagnosis not present

## 2021-02-24 DIAGNOSIS — R0981 Nasal congestion: Secondary | ICD-10-CM | POA: Diagnosis not present

## 2021-02-24 DIAGNOSIS — H6123 Impacted cerumen, bilateral: Secondary | ICD-10-CM | POA: Insufficient documentation

## 2021-02-24 DIAGNOSIS — Z79899 Other long term (current) drug therapy: Secondary | ICD-10-CM | POA: Diagnosis not present

## 2021-02-24 DIAGNOSIS — I10 Essential (primary) hypertension: Secondary | ICD-10-CM | POA: Diagnosis not present

## 2021-02-24 LAB — URINALYSIS, ROUTINE W REFLEX MICROSCOPIC
Bacteria, UA: NONE SEEN
Bilirubin Urine: NEGATIVE
Glucose, UA: NEGATIVE mg/dL
Ketones, ur: NEGATIVE mg/dL
Leukocytes,Ua: NEGATIVE
Nitrite: NEGATIVE
Protein, ur: NEGATIVE mg/dL
Specific Gravity, Urine: 1.004 — ABNORMAL LOW (ref 1.005–1.030)
pH: 6 (ref 5.0–8.0)

## 2021-02-24 LAB — BASIC METABOLIC PANEL
Anion gap: 8 (ref 5–15)
BUN: 9 mg/dL (ref 8–23)
CO2: 30 mmol/L (ref 22–32)
Calcium: 9.4 mg/dL (ref 8.9–10.3)
Chloride: 99 mmol/L (ref 98–111)
Creatinine, Ser: 0.79 mg/dL (ref 0.44–1.00)
GFR, Estimated: >60 mL/min (ref >60)
Glucose, Bld: 111 mg/dL — ABNORMAL HIGH (ref 70–99)
Potassium: 3 mmol/L — ABNORMAL LOW (ref 3.5–5.1)
Sodium: 137 mmol/L (ref 135–145)

## 2021-02-24 LAB — CBC
HCT: 42.8 % (ref 36.0–46.0)
Hemoglobin: 13.7 g/dL (ref 12.0–15.0)
MCH: 27.2 pg (ref 26.0–34.0)
MCHC: 32 g/dL (ref 30.0–36.0)
MCV: 84.9 fL (ref 80.0–100.0)
Platelets: 266 10*3/uL (ref 150–400)
RBC: 5.04 MIL/uL (ref 3.87–5.11)
RDW: 15.1 % (ref 11.5–15.5)
WBC: 4.8 10*3/uL (ref 4.0–10.5)
nRBC: 0 % (ref 0.0–0.2)

## 2021-02-24 MED ORDER — MECLIZINE HCL 25 MG PO TABS: 25.0000 mg | ORAL_TABLET | Freq: Two times a day (BID) | ORAL | 0 refills | Status: AC

## 2021-02-24 MED ORDER — POTASSIUM CHLORIDE CRYS ER 20 MEQ PO TBCR
40.0000 meq | EXTENDED_RELEASE_TABLET | Freq: Once | ORAL | Status: AC
  Administered 2021-02-24: 40 meq via ORAL
  Filled 2021-02-24: qty 2

## 2021-02-24 MED ORDER — POTASSIUM CHLORIDE ER 10 MEQ PO TBCR: 10.0000 meq | EXTENDED_RELEASE_TABLET | Freq: Every day | ORAL | 0 refills | Status: DC

## 2021-02-24 NOTE — ED Notes (Signed)
Pt to scans 

## 2021-02-24 NOTE — ED Notes (Signed)
Family updated as to patient's status.

## 2021-02-24 NOTE — ED Notes (Signed)
Patient transported to CT 

## 2021-02-24 NOTE — ED Triage Notes (Signed)
Per pt, states she has had dizzy spells all her life-states worse the past couple of days-states she has never past out

## 2021-02-24 NOTE — Discharge Instructions (Addendum)
You have been seen and discharged from the emergency department.  Your head CT was normal, your cardiac work-up was normal, your blood work showed low potassium.  Take potassium supplement as directed, have repeat blood work done in about a week and a half.  Follow-up with ear nose and throat for dizziness, I suspect you are suffering from vertigo that could be made worse by cerumen impaction.  Take meclizine as needed for vertigo symptoms.  Stay well-hydrated.  Follow-up with your primary provider for reevaluation and further care. Take home medications as prescribed. If you have any worsening symptoms or further concerns for your health please return to an emergency department for further evaluation.

## 2021-02-24 NOTE — ED Provider Notes (Signed)
[facility]-EMERGENCY DEPT Provider Note   CSN: [id number] Arrival date & time: 02/24/21  1023     History Chief Complaint  Patient presents with   Dizziness    [name] is a 60 y.o. female.  HPI  60 year old female with past medical history of HTN, hypothyroidism, GERD, anxiety presents to the emergency department with ongoing "dizzy spells".  Patient states that she has had this chronically majority of her life.  Over the past week she has been having nasal congestion sinus, sinus pressure and feels as if the dizzy episodes have become more severe.  She states they are isolated events.  Usually started by sudden movement.  She describes it as a room spinning sensation.  They do self resolve.  She is baseline in between episodes.  Denies any associated headache, vision change, acute neurologic symptoms.  She has no associated chest pain, shortness of breath.  Currently she is at baseline with no dizziness.  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  When she does get this treated she states that the dizzy spells improved.  Vertigo has been mentioned to her multiple times but she has never been on medication.  Past Medical History:  Diagnosis Date   Anemia    Anxiety    Arthritis    Bipolar 1 disorder (HCC)    Bursitis    hx of   Chronic bronchitis (HCC)    Complication of anesthesia    difficult to wake up and blood pressure drops   Depression    Diverticula of intestine    Dysrhythmia    no medication treatment   Eczema    GERD (gastroesophageal reflux disease)    H. pylori infection    Headache(784.0)    Heart murmur    Hypertension    sees Dr. 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/09/2016   Mass of parotid gland 10/12/2015   Diverticulitis 08/15/2015   Diverticulitis of large intestine with abscess  11/23/2014   Acute diverticulitis 11/23/2014   Leukocytosis 11/17/2014   Essential hypertension 11/17/2014   Heart murmur    Chronic bronchitis (HCC)    Hypothyroidism    Anxiety    Depression    Mental disorder    GERD (gastroesophageal reflux disease)    Simple chronic bronchitis (HCC)     Past Surgical History:  Procedure Laterality Date   ABDOMINAL HYSTERECTOMY     has both of ovaries   CESAREAN SECTION     COLONOSCOPY     LUMBAR LAMINECTOMY  08/22/2011   Procedure: MICRODISCECTOMY LUMBAR LAMINECTOMY;  Surgeon: Dorian Heckle, MD;  Location: [facility];  Service: Neurosurgery;  Laterality: Right;  RIGHT Lumbar Five-Sacral One microdiskectomy   MOUTH SURGERY     MOUTH SURGERY     MULTIPLE EXTRACTIONS WITH ALVEOLOPLASTY N/A 08/20/2015   Procedure: Extraction of tooth #'s 1-12,14,20-27, 29 with alveoloplasty;  Surgeon: Windy Fast  Jama FlavorsF Kulinski, DDS;  Location: [facility] OR;  Service: Oral Surgery;  Laterality: N/A;   TUBAL LIGATION       OB History   No obstetric history on file.     Family History  Problem Relation Age of Onset   Anesthesia problems Mother    Cancer Mother        thyroid   Hypertension Mother    Breast cancer Mother [id number]   Anesthesia problems Sister    Hypertension Sister    Cancer Maternal Aunt    Cancer Maternal Aunt    Colon cancer Sister    Stroke Sister    Hypertension Brother     Social History   Tobacco Use   Smoking status: Every Day    Packs/day: 0.25    Years: 20.00    Pack years: 5.00    Types: Cigarettes   Smokeless tobacco: Never   Tobacco comments:    4-6  cigs/day  Vaping Use   Vaping Use: Never used  Substance Use Topics   Alcohol use: Yes    Alcohol/week: 0.0 standard drinks    Comment: occ   Drug use: No    Home Medications Prior to Admission medications   Medication Sig Start Date End Date Taking? Authorizing Provider  albuterol (PROVENTIL HFA;VENTOLIN HFA) 108 (90 Base) MCG/ACT inhaler Inhale 2 puffs into the lungs every 4 (four) hours as needed for wheezing or shortness of breath. 04/04/16   Armandina StammerNwoko, Agnes I, NP  ALPRAZolam Prudy Feeler(XANAX) 0.25 MG tablet alprazolam 0.25 mg tablet  Take 1 tablet 3 times a day by oral route as needed.    [provider]  amLODipine (NORVASC) 10 MG tablet Take 1 tablet (10 mg total) by mouth daily. Take this instead of your LOTREL until you see your regular doctor: For high blood pressure Patient taking differently: Take 10 mg by mouth daily. 04/04/16   Armandina StammerNwoko, Agnes I, NP  atenolol (TENORMIN) 50 MG tablet Take 1 tablet (50 mg total) by mouth daily. Need Dr appt for future refills: For high blood pressure Patient taking differently: Take 50 mg by mouth daily. 04/04/16   Armandina StammerNwoko, Agnes I, NP  atorvastatin (LIPITOR) 10 MG tablet Take 10 mg by mouth daily.    [provider]  Butenafine HCl 1 % cream Apply topically daily. 01/29/20   [provider]  citalopram (CELEXA) 20 MG tablet Take 20 mg by mouth daily as needed (depression/anxiety).    [provider]  clorazepate (TRANXENE) 7.5 MG tablet Take by mouth.    [provider]  cycloSPORINE (RESTASIS) 0.05 % ophthalmic emulsion Restasis 0.05 % eye drops in a dropperette  INSTILL 1 DROP INTO AFFECTED EYE(S) BY OPHTHALMIC ROUTE EVERY 12 HOURS as needed    [provider]  fluticasone (FLONASE) 50 MCG/ACT nasal spray fluticasone propionate 50 mcg/actuation nasal spray,suspension  INSTILL 1 SPRAY(S) EVERY DAY BY INTRANASAL ROUTE IN THE MORNING.    [provider]  hydrochlorothiazide (MICROZIDE) 12.5 MG capsule Take  12.5 mg by mouth daily.    [provider]  ibuprofen (ADVIL,MOTRIN) 800 MG tablet Take 1 tablet (800 mg total) by mouth 3 (three) times daily with meals as needed for headache or moderate pain. 02/13/17   Brock BadHarper, Charles A, MD  ibuprofen (ADVIL,MOTRIN) 800 MG tablet TAKE 1 TABLET (800 MG TOTAL) BY MOUTH EVERY EIGHT HOURS AS NEEDED. 10/26/17   Brock BadHarper, Charles A, MD  levothyroxine (SYNTHROID, LEVOTHROID) 125 MCG tablet Take 1 tablet (125 mcg total) by  mouth daily. For hypothyroidism 04/04/16   Armandina Stammer I, NP  loratadine (CLARITIN) 10 MG tablet loratadine 10 mg tablet  TAKE 1 TABLET BY MOUTH DAILY IN THE EVENING    [provider]  neomycin-bacitracin-polymyxin (NEOSPORIN) OINT Apply 1 application topically 2 (two) times daily as needed (for skin irritation on hands).  Patient not taking: Reported on 04/12/2020    [provider]  Olopatadine HCl 0.2 % SOLN olopatadine 0.2 % eye drops  INSTILL 1 DROP INTO AFFECTED EYE(S) BY OPHTHALMIC ROUTE ONCE DAILY    [provider]  ondansetron (ZOFRAN) 4 MG tablet Take 1 tablet (4 mg total) by mouth every 6 (six) hours. Patient not taking: No sig reported 10/04/16   Tilden Fossa, MD  Oxycodone HCl 10 MG TABS Take 10 mg by mouth every 6 (six) hours as needed. 03/03/20   [provider]  oxyCODONE-acetaminophen (PERCOCET) 10-325 MG tablet Take 1 tablet by mouth every 4 (four) hours as needed for pain. 02/05/17   Brock Bad, MD  polycarbophil (FIBERCON) 625 MG tablet Take 1 tablet (625 mg total) by mouth daily as needed (for regularity). Constipation 04/04/16   Armandina Stammer I, NP  polyethylene glycol (MIRALAX / GLYCOLAX) packet Take 17 g by mouth daily as needed for mild constipation. 04/04/16   Armandina Stammer I, NP  QUEtiapine (SEROQUEL) 25 MG tablet Seroquel 25 mg tablet  Take 1 tablet(s) every day by oral route at bedtime.    [provider]  tinidazole (TINDAMAX) 500 MG tablet Take 2 tablets (1,000 mg total)  by mouth daily with breakfast. 04/13/20   Brock Bad, MD  tiotropium (SPIRIVA HANDIHALER) 18 MCG inhalation capsule Spiriva with HandiHaler 18 mcg and inhalation capsules  Inhale 1 capsule every day by inhalation route for 30 days.    [provider]  traMADol (ULTRAM) 50 MG tablet Ultram 50 mg tablet  Take 1 tablet every 6 hours by oral route as needed for 5 days.    [provider]  triamcinolone ointment (KENALOG) 0.1 % SMARTSIG:Sparingly Topical Twice Daily 12/21/19   [provider]    Allergies    Benazepril, Penicillins, Sulfa antibiotics, Ciprofloxacin hcl, Clindamycin/lincomycin, Erythromycin, Hydrocodone-acetaminophen, Other, Zithromax [azithromycin], Sulfamethoxazole, and Tape  Review of Systems   Review of Systems  Constitutional:  Positive for fatigue. Negative for chills and fever.  HENT:  Positive for postnasal drip, rhinorrhea, sinus pressure, sinus pain and sneezing. Negative for congestion and tinnitus.   Eyes:  Negative for visual disturbance.  Respiratory:  Negative for chest tightness and shortness of breath.   Cardiovascular:  Negative for chest pain, palpitations and leg swelling.  Gastrointestinal:  Negative for abdominal pain, diarrhea and vomiting.  Genitourinary:  Negative for dysuria.  Skin:  Negative for rash.  Neurological:  Positive for dizziness. Negative for facial asymmetry, weakness, numbness and headaches.   Physical Exam Updated Vital Signs BP 136/70   Pulse (!) 57   Temp 98.7 F (37.1 C) (Oral)   Resp 16   SpO2 100%   Physical Exam Vitals and nursing note reviewed.  Constitutional:      General: She is not in acute distress.    Appearance: Normal appearance. She is not toxic-appearing.  HENT:     Head: Normocephalic.     Right Ear: External ear normal.     Left Ear: External ear normal.     Ears:     Comments: Almost occlusive cerumen impaction in bilateral ears    Nose:  Congestion present.      Mouth/Throat:     Mouth: Mucous membranes are moist.  Eyes:     Extraocular Movements: Extraocular movements intact.     Pupils: Pupils are equal, round, and reactive to light.     Comments: No nystagmus  Cardiovascular:     Rate and Rhythm: Normal rate.  Pulmonary:     Effort: Pulmonary effort is normal. No respiratory distress.  Abdominal:     Palpations: Abdomen is soft.     Tenderness: There is no abdominal tenderness.  Skin:    General: Skin is warm.  Neurological:     Mental Status: She is alert and oriented to person, place, and time. Mental status is at baseline.     Cranial Nerves: No cranial nerve deficit.  Psychiatric:        Mood and Affect: Mood normal.    ED Results / Procedures / Treatments   Labs (all labs ordered are listed, but only abnormal results are displayed) Labs Reviewed  BASIC METABOLIC PANEL - Abnormal; Notable for the following components:      Result Value   Potassium 3.0 (*)    Glucose, Bld 111 (*)    All other components within normal limits  URINALYSIS, ROUTINE W REFLEX MICROSCOPIC - Abnormal; Notable for the following components:   Color, Urine STRAW (*)    Specific Gravity, Urine 1.004 (*)    Hgb urine dipstick SMALL (*)    All other components within normal limits  CBC    EKG None  Radiology No results found.  Procedures Procedures   Medications Ordered in ED Medications  potassium chloride SA (KLOR-CON) CR tablet 40 mEq (40 mEq Oral Given 02/24/21 1357)    ED Course  I have reviewed the triage vital signs and the nursing notes.  Pertinent labs & imaging results that were available during my care of the patient were reviewed by me and considered in my medical decision making (see chart for details).    MDM Rules/Calculators/A&P                          60 year old female presents emergency department with intermittent episodes of dizziness.  They seem very vertiginous in nature.  EKG is sinus rhythm.  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  Head CT shows no acute finding.  She denies any associated chest pain, shortness of breath, palpitations.  Do not appreciate an acute cardiac/neurologic component of her complaints.  She states has been mentioned to her in the past that she is vertigo but she has not followed up with her ear nose and throat doctor.  She also has history of cerumen impaction, she is almost completely occluded in her bilateral ears.  Will treat patient for hypokalemia and prescribed meclizine.  Patient right now is asymptomatic, atypical for posterior CVA.  Plan to follow-up with ear nose and throat as an outpatient as she has had relief of these dizzy spells with ear cleaning.  Patient will be discharged and treated as an outpatient.  Discharge plan and strict return to ED precautions discussed, patient verbalizes understanding and agreement.  Final Clinical Impression(s) / ED Diagnoses Final diagnoses:  None    Rx / DC Orders ED Discharge Orders     None        Rozelle Logan, DO 02/24/21 1747

## 2021-08-09 ENCOUNTER — Ambulatory Visit: Payer: Medicaid Other | Admitting: Obstetrics

## 2021-09-23 ENCOUNTER — Other Ambulatory Visit: Payer: Self-pay | Admitting: Physician Assistant

## 2021-09-23 DIAGNOSIS — Z1231 Encounter for screening mammogram for malignant neoplasm of breast: Secondary | ICD-10-CM

## 2021-09-26 ENCOUNTER — Ambulatory Visit: Payer: Medicaid Other | Admitting: Advanced Practice Midwife

## 2021-10-14 ENCOUNTER — Other Ambulatory Visit: Payer: Self-pay

## 2021-10-14 ENCOUNTER — Ambulatory Visit (INDEPENDENT_AMBULATORY_CARE_PROVIDER_SITE_OTHER): Payer: Medicaid Other | Admitting: Obstetrics

## 2021-10-14 ENCOUNTER — Encounter: Payer: Self-pay | Admitting: Obstetrics

## 2021-10-14 VITALS — BP 127/72 | HR 62 | Ht 67.0 in | Wt 188.0 lb

## 2021-10-14 DIAGNOSIS — N951 Menopausal and female climacteric states: Secondary | ICD-10-CM | POA: Diagnosis not present

## 2021-10-14 DIAGNOSIS — Z01419 Encounter for gynecological examination (general) (routine) without abnormal findings: Secondary | ICD-10-CM

## 2021-10-14 DIAGNOSIS — F17200 Nicotine dependence, unspecified, uncomplicated: Secondary | ICD-10-CM

## 2021-10-14 DIAGNOSIS — Z87891 Personal history of nicotine dependence: Secondary | ICD-10-CM

## 2021-10-14 DIAGNOSIS — N898 Other specified noninflammatory disorders of vagina: Secondary | ICD-10-CM

## 2021-10-14 DIAGNOSIS — F17210 Nicotine dependence, cigarettes, uncomplicated: Secondary | ICD-10-CM | POA: Diagnosis not present

## 2021-10-14 DIAGNOSIS — Z131 Encounter for screening for diabetes mellitus: Secondary | ICD-10-CM

## 2021-10-14 DIAGNOSIS — Z113 Encounter for screening for infections with a predominantly sexual mode of transmission: Secondary | ICD-10-CM

## 2021-10-14 DIAGNOSIS — R52 Pain, unspecified: Secondary | ICD-10-CM

## 2021-10-14 MED ORDER — IBUPROFEN 800 MG PO TABS: 800.0000 mg | ORAL_TABLET | Freq: Three times a day (TID) | ORAL | 5 refills | Status: AC | PRN

## 2021-10-14 MED ORDER — ESTRADIOL 0.1 MG/24HR TD PTWK: 0.1000 mg | MEDICATED_PATCH | TRANSDERMAL | 12 refills | Status: AC

## 2021-10-14 MED ORDER — TERCONAZOLE 0.4 % VA CREA: 1.0000 | TOPICAL_CREAM | Freq: Every day | VAGINAL | 0 refills | Status: DC

## 2021-10-14 NOTE — Progress Notes (Signed)
GYN presents for AEX.  She had a Hysterectomy in 1998. C/o white clumpy discharge, itching. Next Mammogram is scheduled for 12/12/21

## 2021-10-14 NOTE — Progress Notes (Signed)
Subjective:        [name] is a 61 y.o. female here for a routine exam.  Current complaints: White, clumpy vaginal discharge with itching .    Personal health questionnaire:  Is patient [name], have a family history of breast and/or ovarian cancer: no Is there a family history of uterine cancer diagnosed at age < 25, gastrointestinal cancer, urinary tract cancer, family member who is a Personnel officer syndrome-associated carrier: no Is the patient overweight and hypertensive, family history of diabetes, personal history of gestational diabetes, preeclampsia or PCOS: no Is patient over 39, have PCOS,  family history of premature CHD under age 77, diabetes, smoke, have hypertension or peripheral artery disease:  no At any time, has a partner hit, kicked or otherwise hurt or frightened you?: no Over the past 2 weeks, have you felt down, depressed or hopeless?: no Over the past 2 weeks, have you felt little interest or pleasure in doing things?:no   Gynecologic History No LMP recorded. Patient has had a hysterectomy. Contraception: post menopausal status Last Pap: 1998. Results were: normal Last mammogram: 12-10-2020. Results were: normal  Obstetric History OB History  No obstetric history on file.    Past Medical History:  Diagnosis Date   Anemia    Anxiety    Arthritis    Bipolar 1 disorder (HCC)    Bursitis    hx of   Chronic bronchitis (HCC)    Complication of anesthesia    difficult to wake up and blood pressure drops   Depression    Diverticula of intestine    Dysrhythmia    no medication treatment   Eczema    GERD (gastroesophageal reflux disease)    H. pylori infection    Headache(784.0)    Heart murmur    Hypertension    sees Dr. Shana Chute, is patients primary   Hypothyroidism    takes synthroid   Mental disorder    bipolar   Pneumonia    hx    Past Surgical History:  Procedure Laterality Date   ABDOMINAL HYSTERECTOMY     has both of ovaries    CESAREAN SECTION     COLONOSCOPY     LUMBAR LAMINECTOMY  08/22/2011   Procedure: MICRODISCECTOMY LUMBAR LAMINECTOMY;  Surgeon: Dorian Heckle, MD;  Location: [facility];  Service: Neurosurgery;  Laterality: Right;  RIGHT Lumbar Five-Sacral One microdiskectomy   MOUTH SURGERY     MOUTH SURGERY     MULTIPLE EXTRACTIONS WITH ALVEOLOPLASTY N/A 08/20/2015   Procedure: Extraction of tooth #'s 1-12,14,20-27, 29 with alveoloplasty;  Surgeon: Charlynne Pander, DDS;  Location: [facility] OR;  Service: Oral Surgery;  Laterality: N/A;   TUBAL LIGATION       Current Outpatient Medications:    albuterol (PROVENTIL HFA;VENTOLIN HFA) 108 (90 Base) MCG/ACT inhaler, Inhale 2 puffs into the lungs every 4 (four) hours as needed for wheezing or shortness of breath., Disp: 1 Inhaler, Rfl: 0   estradiol (CLIMARA) 0.1 mg/24hr patch, Place 1 patch (0.1 mg total) onto the skin once a week., Disp: 4 patch, Rfl: 12   levothyroxine (SYNTHROID, LEVOTHROID) 125 MCG tablet, Take 1 tablet (125 mcg total) by mouth daily. For hypothyroidism, Disp: 1 tablet, Rfl: 0   terconazole (TERAZOL 7) 0.4 % vaginal cream, Place 1 applicator vaginally at bedtime., Disp: 45 g, Rfl: 0   ALPRAZolam (XANAX) 0.25 MG tablet, alprazolam 0.25 mg tablet  Take 1 tablet 3 times a day by oral route as  needed. (Patient not taking: Reported on 10/14/2021), Disp: , Rfl:    amLODipine (NORVASC) 10 MG tablet, Take 1 tablet (10 mg total) by mouth daily. Take this instead of your LOTREL until you see your regular doctor: For high blood pressure (Patient taking differently: Take 10 mg by mouth daily.), Disp: 1 tablet, Rfl: 0   atenolol (TENORMIN) 50 MG tablet, Take 1 tablet (50 mg total) by mouth daily. Need Dr appt for future refills: For high blood pressure (Patient taking differently: Take 50 mg by mouth daily.), Disp: 1 tablet, Rfl: 0   atorvastatin (LIPITOR) 10 MG tablet, Take 10 mg by mouth daily., Disp: , Rfl:    Butenafine HCl 1 % cream, Apply topically daily.,  Disp: , Rfl:    citalopram (CELEXA) 20 MG tablet, Take 20 mg by mouth daily as needed (depression/anxiety)., Disp: , Rfl:    clorazepate (TRANXENE) 7.5 MG tablet, Take by mouth., Disp: , Rfl:    cycloSPORINE (RESTASIS) 0.05 % ophthalmic emulsion, Restasis 0.05 % eye drops in a dropperette  INSTILL 1 DROP INTO AFFECTED EYE(S) BY OPHTHALMIC ROUTE EVERY 12 HOURS as needed, Disp: , Rfl:    fluticasone (FLONASE) 50 MCG/ACT nasal spray, fluticasone propionate 50 mcg/actuation nasal spray,suspension  INSTILL 1 SPRAY(S) EVERY DAY BY INTRANASAL ROUTE IN THE MORNING. (Patient not taking: Reported on 10/14/2021), Disp: , Rfl:    hydrochlorothiazide (MICROZIDE) 12.5 MG capsule, Take 12.5 mg by mouth daily., Disp: , Rfl:    ibuprofen (ADVIL) 800 MG tablet, Take 1 tablet (800 mg total) by mouth 3 (three) times daily with meals as needed for headache or moderate pain., Disp: 30 tablet, Rfl: 5   ibuprofen (ADVIL,MOTRIN) 800 MG tablet, TAKE 1 TABLET (800 MG TOTAL) BY MOUTH EVERY EIGHT HOURS AS NEEDED., Disp: 30 tablet, Rfl: 5   loratadine (CLARITIN) 10 MG tablet, loratadine 10 mg tablet  TAKE 1 TABLET BY MOUTH DAILY IN THE EVENING, Disp: , Rfl:    meclizine (ANTIVERT) 25 MG tablet, Take 1 tablet (25 mg total) by mouth 2 (two) times daily., Disp: 30 tablet, Rfl: 0   neomycin-bacitracin-polymyxin (NEOSPORIN) OINT, Apply 1 application topically 2 (two) times daily as needed (for skin irritation on hands).  (Patient not taking: Reported on 04/12/2020), Disp: , Rfl:    Olopatadine HCl 0.2 % SOLN, olopatadine 0.2 % eye drops  INSTILL 1 DROP INTO AFFECTED EYE(S) BY OPHTHALMIC ROUTE ONCE DAILY, Disp: , Rfl:    ondansetron (ZOFRAN) 4 MG tablet, Take 1 tablet (4 mg total) by mouth every 6 (six) hours. (Patient not taking: No sig reported), Disp: 12 tablet, Rfl: 0   Oxycodone HCl 10 MG TABS, Take 10 mg by mouth every 6 (six) hours as needed., Disp: , Rfl:    oxyCODONE-acetaminophen (PERCOCET) 10-325 MG tablet, Take 1 tablet by mouth  every 4 (four) hours as needed for pain., Disp: 30 tablet, Rfl: 0   polycarbophil (FIBERCON) 625 MG tablet, Take 1 tablet (625 mg total) by mouth daily as needed (for regularity). Constipation, Disp: , Rfl:    polyethylene glycol (MIRALAX / GLYCOLAX) packet, Take 17 g by mouth daily as needed for mild constipation., Disp: 14 each, Rfl: 0   potassium chloride (KLOR-CON) 10 MEQ tablet, Take 1 tablet (10 mEq total) by mouth daily for 7 days., Disp: 7 tablet, Rfl: 0   QUEtiapine (SEROQUEL) 25 MG tablet, Seroquel 25 mg tablet  Take 1 tablet(s) every day by oral route at bedtime., Disp: , Rfl:    tinidazole (TINDAMAX) 500  MG tablet, Take 2 tablets (1,000 mg total) by mouth daily with breakfast., Disp: 10 tablet, Rfl: 2   tiotropium (SPIRIVA HANDIHALER) 18 MCG inhalation capsule, Spiriva with HandiHaler 18 mcg and inhalation capsules  Inhale 1 capsule every day by inhalation route for 30 days., Disp: , Rfl:    traMADol (ULTRAM) 50 MG tablet, Ultram 50 mg tablet  Take 1 tablet every 6 hours by oral route as needed for 5 days., Disp: , Rfl:    triamcinolone ointment (KENALOG) 0.1 %, SMARTSIG:Sparingly Topical Twice Daily, Disp: , Rfl:  Allergies  Allergen Reactions   Benazepril Anaphylaxis, Hives and Itching   Penicillins Hives, Itching and Swelling    Face: itching/swelling Has patient had a PCN reaction causing immediate rash, facial/tongue/throat swelling, SOB or lightheadedness with hypotension: Yes Has patient had a PCN reaction causing severe rash involving mucus membranes or skin necrosis: Unk Has patient had a PCN reaction that required hospitalization: No Has patient had a PCN reaction occurring within the last 10 years: No If all of the above answers are "NO", then may proceed with Cephalosporin use.    Sulfa Antibiotics Hives    Itching and facial swelling   Ciprofloxacin Hcl Hives    "funny sensation in gums"   Clindamycin/Lincomycin Hives and Itching    Welts (also)   Erythromycin Hives  and Itching   Hydrocodone-Acetaminophen Hives   Other     Pt has problems with being awaken from anesthesia, Her blood pressure drops very low   Zithromax [Azithromycin] Hives and Itching   Sulfamethoxazole Rash   Tape Itching and Rash    Social History   Tobacco Use   Smoking status: Every Day    Packs/day: 0.25    Years: 20.00    Pack years: 5.00    Types: Cigarettes   Smokeless tobacco: Never   Tobacco comments:    4-6 cigs/day  Substance Use Topics   Alcohol use: Yes    Alcohol/week: 0.0 standard drinks    Comment: occ    Family History  Problem Relation Age of Onset   Anesthesia problems Mother    Cancer Mother        thyroid   Hypertension Mother    Breast cancer Mother 164   Anesthesia problems Sister    Hypertension Sister    Cancer Maternal Aunt    Cancer Maternal Aunt    Colon cancer Sister    Stroke Sister    Hypertension Brother       Review of Systems  Constitutional: negative for fatigue and weight loss Respiratory: negative for cough and wheezing Cardiovascular: negative for chest pain, fatigue and palpitations Gastrointestinal: negative for abdominal pain and change in bowel habits Musculoskeletal:negative for myalgias Neurological: negative for gait problems and tremors Behavioral/Psych: negative for abusive relationship, depression Endocrine: negative for temperature intolerance    Genitourinary: positive for vaginal discharge with itching.  negative for abnormal menstrual periods, genital lesions, hot flashes, sexual problems Integument/breast: negative for breast lump, breast tenderness, nipple discharge and skin lesion(s)    Objective:       BP 127/72    Pulse 62    Ht 5\' 7"  (1.702 m)    Wt 188 lb (85.3 kg)    BMI 29.44 kg/m  General:   Alert and no distress   Skin:   no rash or abnormalities  Lungs:   clear to auscultation bilaterally  Heart:   regular rate and rhythm, S1, S2 normal, no murmur, click, rub or gallop  Breasts:    normal without suspicious masses, skin or nipple changes or axillary nodes  Abdomen:  normal findings: no organomegaly, soft, non-tender and no hernia  Pelvis:  External genitalia: normal general appearance Urinary system: urethral meatus normal and bladder without fullness, nontender Vaginal: normal without tenderness, induration or masses Cervix: absent Adnexa: normal bimanual exam Uterus: absent   Lab Review Urine pregnancy test Labs reviewed yes Radiologic studies reviewed yes  I have spent a total of 20 minutes of face-to-face time, excluding clinical staff time, reviewing notes and preparing to see patient, ordering tests and/or medications, and counseling the patient.   Assessment:     1. Encounter for gynecological examination - doing well  2. Vaginal discharge Rx: - Cervicovaginal ancillary only( [place])  3. Vaginal irritation Rx: - terconazole (TERAZOL 7) 0.4 % vaginal cream; Place 1 applicator vaginally at bedtime.  Dispense: 45 g; Refill: 0  4. Screening for STD (sexually transmitted disease) Rx: - HepB+HepC+HIV Panel - [facility]  5. Tobacco dependence - tobacco cessation recommended.  She is using the nicotine patches.  6. Hot flashes due to menopause Rx: - estradiol (CLIMARA) 0.1 mg/24hr patch; Place 1 patch (0.1 mg total) onto the skin once a week.  Dispense: 4 patch; Refill: 12  7. Pain aggravated by activities of daily living Rx: - ibuprofen (ADVIL) 800 MG tablet; Take 1 tablet (800 mg total) by mouth 3 (three) times daily with meals as needed for headache or moderate pain.  Dispense: 30 tablet; Refill: 5  8. Screening for diabetes mellitus Rx: - Hemoglobin A1c    Plan:    Education reviewed: calcium supplements, depression evaluation, low fat, low cholesterol diet, safe sex/STD prevention, self breast exams, smoking cessation, and weight bearing exercise. Hormone replacement therapy: hormone replacement therapy: [place] Patch, risks and benefits  reviewed, prescription for 12 months, and follow up appointment recommended. Follow up in: 3 months.   Meds ordered this encounter  Medications   terconazole (TERAZOL 7) 0.4 % vaginal cream    Sig: Place 1 applicator vaginally at bedtime.    Dispense:  45 g    Refill:  0   estradiol (CLIMARA) 0.1 mg/24hr patch    Sig: Place 1 patch (0.1 mg total) onto the skin once a week.    Dispense:  4 patch    Refill:  12   ibuprofen (ADVIL) 800 MG tablet    Sig: Take 1 tablet (800 mg total) by mouth 3 (three) times daily with meals as needed for headache or moderate pain.    Dispense:  30 tablet    Refill:  5   Orders Placed This Encounter  Procedures   HepB+HepC+HIV Panel   [facility]   Hemoglobin A1c    Brock Bad, MD 10/14/2021 12:20 PM

## 2021-10-17 LAB — HEPB+HEPC+HIV PANEL
HIV Screen 4th Generation wRfx: NONREACTIVE
Hep B C IgM: NEGATIVE
Hep B Core Total Ab: NEGATIVE
Hep B E Ab: NEGATIVE
Hep B E Ag: NEGATIVE
Hep B Surface Ab, Qual: NONREACTIVE
Hep C Virus Ab: <0.1 s/co ratio (ref 0.0–0.9)
Hepatitis B Surface Ag: NEGATIVE

## 2021-10-17 LAB — CERVICOVAGINAL ANCILLARY ONLY
Bacterial Vaginitis (gardnerella): NEGATIVE
Candida Glabrata: NEGATIVE
Candida Vaginitis: NEGATIVE
Comment: NEGATIVE
Comment: NEGATIVE
Comment: NEGATIVE
Comment: NEGATIVE
Trichomonas: NEGATIVE

## 2021-10-17 LAB — HEMOGLOBIN A1C
Est. average glucose Bld gHb Est-mCnc: 128 mg/dL
Hgb A1c MFr Bld: 6.1 % — ABNORMAL HIGH (ref 4.8–5.6)

## 2021-10-17 LAB — RPR: RPR Ser Ql: NONREACTIVE

## 2021-12-12 ENCOUNTER — Ambulatory Visit
Admission: RE | Admit: 2021-12-12 | Discharge: 2021-12-12 | Disposition: A | Discharge status: Home / Self Care | Payer: Medicaid Other | Source: Ambulatory Visit | Attending: Physician Assistant | Admitting: Physician Assistant

## 2021-12-12 DIAGNOSIS — Z1231 Encounter for screening mammogram for malignant neoplasm of breast: Secondary | ICD-10-CM

## 2022-06-09 ENCOUNTER — Emergency Department (HOSPITAL_COMMUNITY)
Admission: EM | Admit: 2022-06-09 | Discharge: 2022-06-09 | Disposition: A | Discharge status: Home / Self Care | Payer: Medicaid Other | Attending: Emergency Medicine | Admitting: Emergency Medicine

## 2022-06-09 ENCOUNTER — Other Ambulatory Visit: Payer: Self-pay

## 2022-06-09 ENCOUNTER — Encounter (HOSPITAL_COMMUNITY): Payer: Self-pay

## 2022-06-09 ENCOUNTER — Emergency Department (HOSPITAL_COMMUNITY): Payer: Medicaid Other

## 2022-06-09 DIAGNOSIS — J069 Acute upper respiratory infection, unspecified: Secondary | ICD-10-CM | POA: Insufficient documentation

## 2022-06-09 DIAGNOSIS — J441 Chronic obstructive pulmonary disease with (acute) exacerbation: Secondary | ICD-10-CM | POA: Diagnosis not present

## 2022-06-09 DIAGNOSIS — Z7951 Long term (current) use of inhaled steroids: Secondary | ICD-10-CM | POA: Diagnosis not present

## 2022-06-09 DIAGNOSIS — R059 Cough, unspecified: Secondary | ICD-10-CM | POA: Diagnosis present

## 2022-06-09 MED ORDER — PREDNISONE 20 MG PO TABS
ORAL_TABLET | ORAL | 0 refills | Status: AC
Start: 2022-06-09 — End: ?

## 2022-06-09 NOTE — ED Notes (Signed)
Patient transported to X-ray 

## 2022-06-09 NOTE — ED Triage Notes (Signed)
Ambulatory to ED with c/o shortness of breath starting today. States last week, she had a scratchy throat and a headaches. States she went to PCP, dx with sinusitis - prescribed abx but is allergic to it. Now feels congested in her chest.

## 2022-06-09 NOTE — ED Provider Notes (Signed)
[facility] Provider Note   CSN: [id number] Arrival date & time: 06/09/22  2030     History  Chief Complaint  Patient presents with   Shortness of Breath    [name] is a 61 y.o. female.  Patient is a 61 year old female who presents with cough and chest congestion.  She states that she started having some cold symptoms about a week ago with some nasal congestion and a scratchy throat.  She went to her PCP and had COVID test that was negative.  She was diagnosed with sinusitis and prescribed doxycycline but says she cannot take it because she is allergic to it.  She now feels like she has some congestion in her chest.  She denies any known fevers.  No vomiting.  No pain in her chest.  She said she felt short of breath earlier when she used her inhaler but denies any current shortness of breath.  No leg pain or swelling.  She does have a history of COPD.       Home Medications Prior to Admission medications   Medication Sig Start Date End Date Taking? Authorizing Provider  predniSONE (DELTASONE) 20 MG tablet 3 tabs po day one, then 2 po daily x 4 days 06/09/22  Yes Rolan Bucco, MD  albuterol (PROVENTIL HFA;VENTOLIN HFA) 108 (90 Base) MCG/ACT inhaler Inhale 2 puffs into the lungs every 4 (four) hours as needed for wheezing or shortness of breath. 04/04/16   Armandina Stammer I, NP  ALPRAZolam Prudy Feeler) 0.25 MG tablet alprazolam 0.25 mg tablet  Take 1 tablet 3 times a day by oral route as needed. Patient not taking: Reported on 10/14/2021    [provider]  amLODipine (NORVASC) 10 MG tablet Take 1 tablet (10 mg total) by mouth daily. Take this instead of your LOTREL until you see your regular doctor: For high blood pressure Patient taking differently: Take 10 mg by mouth daily. 04/04/16   Armandina Stammer I, NP  atenolol (TENORMIN) 50 MG tablet Take 1 tablet (50 mg total) by mouth daily. Need Dr appt for future refills: For high blood  pressure Patient taking differently: Take 50 mg by mouth daily. 04/04/16   Armandina Stammer I, NP  atorvastatin (LIPITOR) 10 MG tablet Take 10 mg by mouth daily.    [provider]  Butenafine HCl 1 % cream Apply topically daily. 01/29/20   [provider]  citalopram (CELEXA) 20 MG tablet Take 20 mg by mouth daily as needed (depression/anxiety).    [provider]  clorazepate (TRANXENE) 7.5 MG tablet Take by mouth.    [provider]  cycloSPORINE (RESTASIS) 0.05 % ophthalmic emulsion Restasis 0.05 % eye drops in a dropperette  INSTILL 1 DROP INTO AFFECTED EYE(S) BY OPHTHALMIC ROUTE EVERY 12 HOURS as needed    [provider]  estradiol (CLIMARA) 0.1 mg/24hr patch Place 1 patch (0.1 mg total) onto the skin once a week. 10/14/21   Brock Bad, MD  fluticasone (FLONASE) 50 MCG/ACT nasal spray fluticasone propionate 50 mcg/actuation nasal spray,suspension  INSTILL 1 SPRAY(S) EVERY DAY BY INTRANASAL ROUTE IN THE MORNING. Patient not taking: Reported on 10/14/2021    [provider]  hydrochlorothiazide (MICROZIDE) 12.5 MG capsule Take 12.5 mg by mouth daily.    [provider]  ibuprofen (ADVIL) 800 MG tablet Take 1 tablet (800 mg total) by mouth 3 (three) times daily with meals as needed for headache or moderate pain. 10/14/21   Coral Ceo  A, MD  ibuprofen (ADVIL,MOTRIN) 800 MG tablet TAKE 1 TABLET (800 MG TOTAL) BY MOUTH EVERY EIGHT HOURS AS NEEDED. 10/26/17   Brock Bad, MD  levothyroxine (SYNTHROID, LEVOTHROID) 125 MCG tablet Take 1 tablet (125 mcg total) by mouth daily. For hypothyroidism 04/04/16   Armandina Stammer I, NP  loratadine (CLARITIN) 10 MG tablet loratadine 10 mg tablet  TAKE 1 TABLET BY MOUTH DAILY IN THE EVENING    [provider]  meclizine (ANTIVERT) 25 MG tablet Take 1 tablet (25 mg total) by mouth 2 (two) times daily. 02/24/21   Horton, Clabe Seal, DO  neomycin-bacitracin-polymyxin (NEOSPORIN) OINT Apply 1  application topically 2 (two) times daily as needed (for skin irritation on hands).  Patient not taking: Reported on 04/12/2020    [provider]  Olopatadine HCl 0.2 % SOLN olopatadine 0.2 % eye drops  INSTILL 1 DROP INTO AFFECTED EYE(S) BY OPHTHALMIC ROUTE ONCE DAILY    [provider]  ondansetron (ZOFRAN) 4 MG tablet Take 1 tablet (4 mg total) by mouth every 6 (six) hours. Patient not taking: No sig reported 10/04/16   Tilden Fossa, MD  Oxycodone HCl 10 MG TABS Take 10 mg by mouth every 6 (six) hours as needed. 03/03/20   [provider]  oxyCODONE-acetaminophen (PERCOCET) 10-325 MG tablet Take 1 tablet by mouth every 4 (four) hours as needed for pain. 02/05/17   Brock Bad, MD  polycarbophil (FIBERCON) 625 MG tablet Take 1 tablet (625 mg total) by mouth daily as needed (for regularity). Constipation 04/04/16   Armandina Stammer I, NP  polyethylene glycol (MIRALAX / GLYCOLAX) packet Take 17 g by mouth daily as needed for mild constipation. 04/04/16   Armandina Stammer I, NP  potassium chloride (KLOR-CON) 10 MEQ tablet Take 1 tablet (10 mEq total) by mouth daily for 7 days. 02/24/21 03/03/21  Horton, Clabe Seal, DO  QUEtiapine (SEROQUEL) 25 MG tablet Seroquel 25 mg tablet  Take 1 tablet(s) every day by oral route at bedtime.    [provider]  terconazole (TERAZOL 7) 0.4 % vaginal cream Place 1 applicator vaginally at bedtime. 10/14/21   Brock Bad, MD  tinidazole (TINDAMAX) 500 MG tablet Take 2 tablets (1,000 mg total) by mouth daily with breakfast. 04/13/20   Brock Bad, MD  tiotropium (SPIRIVA HANDIHALER) 18 MCG inhalation capsule Spiriva with HandiHaler 18 mcg and inhalation capsules  Inhale 1 capsule every day by inhalation route for 30 days.    [provider]  traMADol (ULTRAM) 50 MG tablet Ultram 50 mg tablet  Take 1 tablet every 6 hours by oral route as needed for 5 days.    [provider]  triamcinolone ointment (KENALOG) 0.1 %  SMARTSIG:Sparingly Topical Twice Daily 12/21/19   [provider]      Allergies    Benazepril, Penicillins, Sulfa antibiotics, Ciprofloxacin hcl, Clindamycin/lincomycin, Erythromycin, Hydrocodone-acetaminophen, Other, Zithromax [azithromycin], Sulfamethoxazole, and Tape    Review of Systems   Review of Systems  Constitutional:  Positive for fatigue. Negative for chills, diaphoresis and fever.  HENT:  Positive for congestion, postnasal drip and rhinorrhea. Negative for sneezing.   Eyes: Negative.   Respiratory:  Positive for cough and shortness of breath. Negative for chest tightness.   Cardiovascular:  Negative for chest pain and leg swelling.  Gastrointestinal:  Negative for abdominal pain, blood in stool, diarrhea, nausea and vomiting.  Genitourinary:  Negative for difficulty urinating, flank pain, frequency and hematuria.  Musculoskeletal:  Negative for arthralgias and  back pain.  Skin:  Negative for rash.  Neurological:  Negative for dizziness, speech difficulty, weakness, numbness and headaches.    Physical Exam Updated Vital Signs BP (!) 166/72 (BP Location: Left Arm)   Pulse 75   Temp 98.8 F (37.1 C) (Oral)   Resp 18   Ht 5\' 7"  (1.702 m)   Wt 85.2 kg   SpO2 97%   BMI 29.42 kg/m  Physical Exam Constitutional:      Appearance: She is well-developed.  HENT:     Head: Normocephalic and atraumatic.  Eyes:     Pupils: Pupils are equal, round, and reactive to light.  Cardiovascular:     Rate and Rhythm: Normal rate and regular rhythm.     Heart sounds: Normal heart sounds.  Pulmonary:     Effort: Pulmonary effort is normal. No respiratory distress.     Breath sounds: Wheezing (Few scattered wheezes in the bases but no increased work of breathing) present. No rales.  Chest:     Chest wall: No tenderness.  Abdominal:     General: Bowel sounds are normal.     Palpations: Abdomen is soft.     Tenderness: There is no abdominal tenderness. There is no guarding or  rebound.  Musculoskeletal:        General: Normal range of motion.     Cervical back: Normal range of motion and neck supple.     Comments: No edema or calf tenderness  Lymphadenopathy:     Cervical: No cervical adenopathy.  Skin:    General: Skin is warm and dry.     Findings: No rash.  Neurological:     Mental Status: She is alert and oriented to person, place, and time.     ED Results / Procedures / Treatments   Labs (all labs ordered are listed, but only abnormal results are displayed) Labs Reviewed - No data to display  EKG None  Radiology DG Chest 2 View  Result Date: 06/09/2022 CLINICAL DATA:  Cough, congestion, and shortness of breath starting today. Last week scratchy throat and headaches. Recent diagnosis of sinusitis. EXAM: CHEST - 2 VIEW COMPARISON:  09/28/2020 FINDINGS: Mild hyperinflation. The heart size and mediastinal contours are within normal limits. Both lungs are clear. The visualized skeletal structures are unremarkable. IMPRESSION: No active cardiopulmonary disease. Electronically Signed   By: Lucienne Capers M.D.   On: 06/09/2022 21:20    Procedures Procedures    Medications Ordered in ED Medications - No data to display  ED Course/ Medical Decision Making/ A&P                           Medical Decision Making Amount and/or Complexity of Data Reviewed Radiology: ordered.  Risk Prescription drug management.   Patient is a 61 year old female who presents with cough and chest congestion.  She had a little bit of wheezing on exam but no increased work of breathing.  Chest x-ray was done which was interpreted by me and confirmed by the radiologist to show no evidence of pneumonia.  No pulmonary edema.  I suspect that she has a URI with some associated COPD exacerbation.  She is otherwise well-appearing.  Her vital signs are stable.  She has no hypoxia.  No concerns for PE.  No concerns for ACS.  She was discharged home in good condition.  We will start  her on a prednisone burst.  She was advised to use her albuterol  inhaler regularly for the next few days and then go back to as needed.  She was encouraged to have close follow-up with her PCP.  Return precautions were given.  Final Clinical Impression(s) / ED Diagnoses Final diagnoses:  COPD exacerbation (HCC)  Viral upper respiratory tract infection    Rx / DC Orders ED Discharge Orders          Ordered    predniSONE (DELTASONE) 20 MG tablet        06/09/22 2134              Rolan Bucco, MD 06/09/22 2136

## 2022-07-11 ENCOUNTER — Other Ambulatory Visit: Payer: Self-pay | Admitting: Obstetrics

## 2022-07-11 DIAGNOSIS — N898 Other specified noninflammatory disorders of vagina: Secondary | ICD-10-CM

## 2022-08-08 ENCOUNTER — Other Ambulatory Visit: Payer: Self-pay

## 2022-08-08 ENCOUNTER — Emergency Department (HOSPITAL_COMMUNITY)
Admission: EM | Admit: 2022-08-08 | Discharge: 2022-08-08 | Disposition: A | Discharge status: Home / Self Care | Payer: Medicaid Other | Attending: Emergency Medicine | Admitting: Emergency Medicine

## 2022-08-08 ENCOUNTER — Encounter (HOSPITAL_COMMUNITY): Payer: Self-pay

## 2022-08-08 DIAGNOSIS — I10 Essential (primary) hypertension: Secondary | ICD-10-CM | POA: Insufficient documentation

## 2022-08-08 DIAGNOSIS — E039 Hypothyroidism, unspecified: Secondary | ICD-10-CM | POA: Insufficient documentation

## 2022-08-08 DIAGNOSIS — Z79899 Other long term (current) drug therapy: Secondary | ICD-10-CM | POA: Insufficient documentation

## 2022-08-08 DIAGNOSIS — R197 Diarrhea, unspecified: Secondary | ICD-10-CM | POA: Diagnosis present

## 2022-08-08 DIAGNOSIS — E876 Hypokalemia: Secondary | ICD-10-CM | POA: Insufficient documentation

## 2022-08-08 LAB — COMPREHENSIVE METABOLIC PANEL
ALT: 27 U/L (ref 0–44)
AST: 32 U/L (ref 15–41)
Albumin: 4 g/dL (ref 3.5–5.0)
Alkaline Phosphatase: 83 U/L (ref 38–126)
Anion gap: 10 (ref 5–15)
BUN: 16 mg/dL (ref 8–23)
CO2: 29 mmol/L (ref 22–32)
Calcium: 8.9 mg/dL (ref 8.9–10.3)
Chloride: 97 mmol/L — ABNORMAL LOW (ref 98–111)
Creatinine, Ser: 0.96 mg/dL (ref 0.44–1.00)
GFR, Estimated: >60 mL/min (ref >60)
Glucose, Bld: 102 mg/dL — ABNORMAL HIGH (ref 70–99)
Potassium: 2.8 mmol/L — ABNORMAL LOW (ref 3.5–5.1)
Sodium: 136 mmol/L (ref 135–145)
Total Bilirubin: 0.7 mg/dL (ref 0.3–1.2)
Total Protein: 7 g/dL (ref 6.5–8.1)

## 2022-08-08 LAB — URINALYSIS, ROUTINE W REFLEX MICROSCOPIC
Bilirubin Urine: NEGATIVE
Glucose, UA: NEGATIVE mg/dL
Ketones, ur: 5 mg/dL — AB
Leukocytes,Ua: NEGATIVE
Nitrite: NEGATIVE
Protein, ur: 30 mg/dL — AB
Specific Gravity, Urine: 1.026 (ref 1.005–1.030)
pH: 5 (ref 5.0–8.0)

## 2022-08-08 LAB — CBC
HCT: 45.9 % (ref 36.0–46.0)
Hemoglobin: 14.4 g/dL (ref 12.0–15.0)
MCH: 26.9 pg (ref 26.0–34.0)
MCHC: 31.4 g/dL (ref 30.0–36.0)
MCV: 85.6 fL (ref 80.0–100.0)
Platelets: 226 10*3/uL (ref 150–400)
RBC: 5.36 MIL/uL — ABNORMAL HIGH (ref 3.87–5.11)
RDW: 14.2 % (ref 11.5–15.5)
WBC: 3.8 10*3/uL — ABNORMAL LOW (ref 4.0–10.5)
nRBC: 0 % (ref 0.0–0.2)

## 2022-08-08 LAB — LIPASE, BLOOD: Lipase: 27 U/L (ref 11–51)

## 2022-08-08 MED ORDER — POTASSIUM CHLORIDE CRYS ER 20 MEQ PO TBCR
40.0000 meq | EXTENDED_RELEASE_TABLET | Freq: Once | ORAL | Status: AC
  Administered 2022-08-08: 40 meq via ORAL
  Filled 2022-08-08: qty 2

## 2022-08-08 MED ORDER — POTASSIUM CHLORIDE CRYS ER 20 MEQ PO TBCR: 20.0000 meq | EXTENDED_RELEASE_TABLET | Freq: Two times a day (BID) | ORAL | 0 refills | Status: AC

## 2022-08-08 MED ORDER — LOPERAMIDE HCL 2 MG PO CAPS: 2.0000 mg | ORAL_CAPSULE | Freq: Four times a day (QID) | ORAL | 0 refills | Status: AC | PRN

## 2022-08-08 MED ORDER — ONDANSETRON 8 MG PO TBDP: 8.0000 mg | ORAL_TABLET | Freq: Three times a day (TID) | ORAL | 0 refills | Status: AC | PRN

## 2022-08-08 NOTE — Discharge Instructions (Addendum)
Take the Zofran and Imodium as needed for nausea and diarrhea.  Your symptoms should resolve over the next few days.  Your potassium was low.  Take the potassium medication as prescribed.  Follow-up with your doctor next week to have that rechecked

## 2022-08-08 NOTE — ED Provider Triage Note (Signed)
Emergency Medicine Provider Triage Evaluation Note  [name] , a 61 y.o. female  was evaluated in triage.  Pt complains of diarrhea. Patient's states one of her nephew came home from school last week with a GI bug.  Patient initially felt fine but then started having a few episodes of mucoid stool and rumbling in her stomach.  She did not have any nausea or vomiting.  Over the weekend her she started having multiple episodes of diarrhea.  She tried taking some Tums.  Yesterday she thought she was getting better because she did not have any more diarrhea.  However this morning she had large loose stool.  She did have some nausea.  Patient has had diverticulitis before.  Right now she is not having any abdominal pain Review of Systems  Positive: Diarrhea Negative: Vomiting, fever  Physical Exam  BP (!) 172/90 (BP Location: Left Arm)   Pulse 74   Temp 98.4 F (36.9 C) (Oral)   Resp 16   SpO2 100%  Gen:   Awake, no distress   Resp:  Normal effort  MSK:   Moves extremities without difficulty  Other:  Abdomen soft nontender  Medical Decision Making  Medically screening exam initiated at 7:55 AM.  Appropriate orders placed.  Michail Jewels was informed that the remainder of the evaluation will be completed by another provider, this initial triage assessment does not replace that evaluation, and the importance of remaining in the ED until their evaluation is complete.     Linwood Dibbles, MD 08/08/22 [phone]

## 2022-08-08 NOTE — ED Triage Notes (Signed)
Patients stomach has been "rumbling" since Friday. Has had diarrhea "way too many times." She said she feels a knot in the top of her stomach.

## 2022-08-08 NOTE — ED Provider Notes (Signed)
[facility]-EMERGENCY DEPT Provider Note   CSN: [id number] Arrival date & time: 08/08/22  0725     History  Chief Complaint  Patient presents with   Diarrhea         [name] is a 61 y.o. female.   Diarrhea  Patient has a history of hypertension hypothyroidism, anemia, depression, GERD who presents to the ED with complaints of diarrhea.  Patient states she was exposed to a family member last week who had a GI bug.  Patient started experiencing covid stools and diarrhea over the weekend.  It was most severe over the weekend.  She tried over-the-counter Tums.  Patient states she is not having any abdominal pain but does feel little discomfort in her upper abdomen like a knot.  She thought patient was feeling better and was able to eat chicken and potatoes last evening.  This morning when she woke up however she had a large loose stool.  She did also experience some nausea.  She was concerned she might be dehydrated or diverticulitis which she has had before so she came to the ED.  She is not having any fevers.  She denies any abdominal pain.  No vomiting.  No blood in her stool.    Home Medications Prior to Admission medications   Medication Sig Start Date End Date Taking? Authorizing Provider  loperamide (IMODIUM) 2 MG capsule Take 1 capsule (2 mg total) by mouth 4 (four) times daily as needed for diarrhea or loose stools. 08/08/22  Yes Linwood Dibbles, MD  ondansetron (ZOFRAN-ODT) 8 MG disintegrating tablet Take 1 tablet (8 mg total) by mouth every 8 (eight) hours as needed for nausea or vomiting. 08/08/22  Yes Linwood Dibbles, MD  potassium chloride SA (KLOR-CON M) 20 MEQ tablet Take 1 tablet (20 mEq total) by mouth 2 (two) times daily for 7 days. 08/08/22 08/15/22 Yes Linwood Dibbles, MD  albuterol (PROVENTIL HFA;VENTOLIN HFA) 108 (90 Base) MCG/ACT inhaler Inhale 2 puffs into the lungs every 4 (four) hours as needed for wheezing or shortness of breath. 04/04/16   Armandina Stammer  I, NP  ALPRAZolam Prudy Feeler) 0.25 MG tablet alprazolam 0.25 mg tablet  Take 1 tablet 3 times a day by oral route as needed. Patient not taking: Reported on 10/14/2021    [provider]  amLODipine (NORVASC) 10 MG tablet Take 1 tablet (10 mg total) by mouth daily. Take this instead of your LOTREL until you see your regular doctor: For high blood pressure Patient taking differently: Take 10 mg by mouth daily. 04/04/16   Armandina Stammer I, NP  atenolol (TENORMIN) 50 MG tablet Take 1 tablet (50 mg total) by mouth daily. Need Dr appt for future refills: For high blood pressure Patient taking differently: Take 50 mg by mouth daily. 04/04/16   Armandina Stammer I, NP  atorvastatin (LIPITOR) 10 MG tablet Take 10 mg by mouth daily.    [provider]  Butenafine HCl 1 % cream Apply topically daily. 01/29/20   [provider]  citalopram (CELEXA) 20 MG tablet Take 20 mg by mouth daily as needed (depression/anxiety).    [provider]  clorazepate (TRANXENE) 7.5 MG tablet Take by mouth.    [provider]  cycloSPORINE (RESTASIS) 0.05 % ophthalmic emulsion Restasis 0.05 % eye drops in a dropperette  INSTILL 1 DROP INTO AFFECTED EYE(S) BY OPHTHALMIC ROUTE EVERY 12 HOURS as needed    [provider]  estradiol (CLIMARA) 0.1 mg/24hr patch Place 1  patch (0.1 mg total) onto the skin once a week. 10/14/21   Brock BadHarper, Charles A, MD  fluticasone (FLONASE) 50 MCG/ACT nasal spray fluticasone propionate 50 mcg/actuation nasal spray,suspension  INSTILL 1 SPRAY(S) EVERY DAY BY INTRANASAL ROUTE IN THE MORNING. Patient not taking: Reported on 10/14/2021    [provider]  hydrochlorothiazide (MICROZIDE) 12.5 MG capsule Take 12.5 mg by mouth daily.    [provider]  ibuprofen (ADVIL) 800 MG tablet Take 1 tablet (800 mg total) by mouth 3 (three) times daily with meals as needed for headache or moderate pain. 10/14/21   Brock BadHarper, Charles A, MD  ibuprofen (ADVIL,MOTRIN) 800  MG tablet TAKE 1 TABLET (800 MG TOTAL) BY MOUTH EVERY EIGHT HOURS AS NEEDED. 10/26/17   Brock BadHarper, Charles A, MD  levothyroxine (SYNTHROID, LEVOTHROID) 125 MCG tablet Take 1 tablet (125 mcg total) by mouth daily. For hypothyroidism 04/04/16   Armandina StammerNwoko, Agnes I, NP  loratadine (CLARITIN) 10 MG tablet loratadine 10 mg tablet  TAKE 1 TABLET BY MOUTH DAILY IN THE EVENING    [provider]  meclizine (ANTIVERT) 25 MG tablet Take 1 tablet (25 mg total) by mouth 2 (two) times daily. 02/24/21   Horton, Clabe SealKristie M, DO  neomycin-bacitracin-polymyxin (NEOSPORIN) OINT Apply 1 application topically 2 (two) times daily as needed (for skin irritation on hands).  Patient not taking: Reported on 04/12/2020    [provider]  Olopatadine HCl 0.2 % SOLN olopatadine 0.2 % eye drops  INSTILL 1 DROP INTO AFFECTED EYE(S) BY OPHTHALMIC ROUTE ONCE DAILY    [provider]  ondansetron (ZOFRAN) 4 MG tablet Take 1 tablet (4 mg total) by mouth every 6 (six) hours. Patient not taking: No sig reported 10/04/16   Tilden Fossaees, Elizabeth, MD  Oxycodone HCl 10 MG TABS Take 10 mg by mouth every 6 (six) hours as needed. 03/03/20   [provider]  oxyCODONE-acetaminophen (PERCOCET) 10-325 MG tablet Take 1 tablet by mouth every 4 (four) hours as needed for pain. 02/05/17   Brock BadHarper, Charles A, MD  polycarbophil (FIBERCON) 625 MG tablet Take 1 tablet (625 mg total) by mouth daily as needed (for regularity). Constipation 04/04/16   Armandina StammerNwoko, Agnes I, NP  polyethylene glycol (MIRALAX / GLYCOLAX) packet Take 17 g by mouth daily as needed for mild constipation. 04/04/16   Armandina StammerNwoko, Agnes I, NP  predniSONE (DELTASONE) 20 MG tablet 3 tabs po day one, then 2 po daily x 4 days 06/09/22   Rolan BuccoBelfi, Melanie, MD  QUEtiapine (SEROQUEL) 25 MG tablet Seroquel 25 mg tablet  Take 1 tablet(s) every day by oral route at bedtime.    [provider]  terconazole (TERAZOL 7) 0.4 % vaginal cream PLACE 1 APPLICATOR VAGINALLY AT BEDTIME. 07/12/22    Brock BadHarper, Charles A, MD  tinidazole (TINDAMAX) 500 MG tablet Take 2 tablets (1,000 mg total) by mouth daily with breakfast. 04/13/20   Brock BadHarper, Charles A, MD  tiotropium (SPIRIVA HANDIHALER) 18 MCG inhalation capsule Spiriva with HandiHaler 18 mcg and inhalation capsules  Inhale 1 capsule every day by inhalation route for 30 days.    [provider]  traMADol (ULTRAM) 50 MG tablet Ultram 50 mg tablet  Take 1 tablet every 6 hours by oral route as needed for 5 days.    [provider]  triamcinolone ointment (KENALOG) 0.1 % SMARTSIG:Sparingly Topical Twice Daily 12/21/19   [provider]      Allergies    Benazepril, Penicillins, Sulfa antibiotics, Ciprofloxacin hcl, Clindamycin/lincomycin, Erythromycin, Hydrocodone-acetaminophen, Other, Zithromax [azithromycin],  Sulfamethoxazole, and Tape    Review of Systems   Review of Systems  Gastrointestinal:  Positive for diarrhea.    Physical Exam Updated Vital Signs BP (!) 172/90 (BP Location: Left Arm)   Pulse 74   Temp 98.4 F (36.9 C) (Oral)   Resp 16   SpO2 100%  Physical Exam Vitals and nursing note reviewed.  Constitutional:      General: She is not in acute distress.    Appearance: She is well-developed.  HENT:     Head: Normocephalic and atraumatic.     Right Ear: External ear normal.     Left Ear: External ear normal.     Mouth/Throat:     Mouth: Mucous membranes are moist.  Eyes:     General: No scleral icterus.       Right eye: No discharge.        Left eye: No discharge.     Conjunctiva/sclera: Conjunctivae normal.  Neck:     Trachea: No tracheal deviation.  Cardiovascular:     Rate and Rhythm: Normal rate and regular rhythm.  Pulmonary:     Effort: Pulmonary effort is normal. No respiratory distress.     Breath sounds: Normal breath sounds. No stridor. No wheezing or rales.  Abdominal:     General: Bowel sounds are normal. There is no distension.     Palpations: Abdomen is soft.      Tenderness: There is no abdominal tenderness. There is no guarding or rebound.  Musculoskeletal:        General: No tenderness or deformity.     Cervical back: Neck supple.  Skin:    General: Skin is warm and dry.     Findings: No rash.  Neurological:     General: No focal deficit present.     Mental Status: She is alert.     Cranial Nerves: No cranial nerve deficit (no facial droop, extraocular movements intact, no slurred speech).     Sensory: No sensory deficit.     Motor: No abnormal muscle tone or seizure activity.     Coordination: Coordination normal.  Psychiatric:        Mood and Affect: Mood normal.     ED Results / Procedures / Treatments   Labs (all labs ordered are listed, but only abnormal results are displayed) Labs Reviewed  COMPREHENSIVE METABOLIC PANEL - Abnormal; Notable for the following components:      Result Value   Potassium 2.8 (*)    Chloride 97 (*)    Glucose, Bld 102 (*)    All other components within normal limits  CBC - Abnormal; Notable for the following components:   WBC 3.8 (*)    RBC 5.36 (*)    All other components within normal limits  URINALYSIS, ROUTINE W REFLEX MICROSCOPIC - Abnormal; Notable for the following components:   Color, Urine AMBER (*)    APPearance CLOUDY (*)    Hgb urine dipstick SMALL (*)    Ketones, ur 5 (*)    Protein, ur 30 (*)    Bacteria, UA FEW (*)    All other components within normal limits  LIPASE, BLOOD    EKG None  Radiology No results found.  Procedures Procedures    Medications Ordered in ED Medications  potassium chloride SA (KLOR-CON M) CR tablet 40 mEq (has no administration in time range)    ED Course/ Medical Decision Making/ A&P Clinical Course as of 08/08/22 0856  Tue Aug 08, 2022  9728 Laboratory test reviewed.  No leukocytosis.  Lipase normal.  Potassium decreased at 2.8.  No signs of dehydration urinalysis not suggestive of infection [JK]    Clinical Course User Index [JK]  Linwood Dibbles, MD                           Medical Decision Making Ddx includes, viral ge, diverticulitis, obstruction, dehydration  Problems Addressed: Diarrhea of presumed infectious origin: acute illness or injury Hypokalemia: acute illness or injury that poses a threat to life or bodily functions  Amount and/or Complexity of Data Reviewed Labs: ordered. Decision-making details documented in ED Course.  Risk Prescription drug management.   ED workup reassuring.  Patient's abdominal exam is benign.  She has no tenderness.  I doubt obstruction diverticulitis colitis.  Few had a GI bug.  Suspect this is viral in nature.  Labs are notable for hypokalemia but she is not having any trouble with keeping food or fluids down.  Patient was given an oral dose of potassium and will discharge home with potassium.  Zofran and Imodium as needed for symptomatic relief.  Outpatient follow-up with PCP.  Evaluation and diagnostic testing in the emergency department does not suggest an emergent condition requiring admission or immediate intervention beyond what has been performed at this time.  The patient is safe for discharge and has been instructed to return immediately for worsening symptoms, change in symptoms or any other concerns.        Final Clinical Impression(s) / ED Diagnoses Final diagnoses:  Hypokalemia  Diarrhea of presumed infectious origin    Rx / DC Orders ED Discharge Orders          Ordered    ondansetron (ZOFRAN-ODT) 8 MG disintegrating tablet  Every 8 hours PRN        08/08/22 0852    loperamide (IMODIUM) 2 MG capsule  4 times daily PRN        08/08/22 0852    potassium chloride SA (KLOR-CON M) 20 MEQ tablet  2 times daily        08/08/22 2060              Linwood Dibbles, MD 08/08/22 [phone]

## 2022-11-02 ENCOUNTER — Encounter (HOSPITAL_COMMUNITY): Payer: Self-pay | Admitting: Emergency Medicine

## 2022-11-02 ENCOUNTER — Other Ambulatory Visit: Payer: Self-pay

## 2022-11-02 ENCOUNTER — Emergency Department (HOSPITAL_COMMUNITY): Payer: No Typology Code available for payment source

## 2022-11-02 ENCOUNTER — Emergency Department (HOSPITAL_COMMUNITY)
Admission: EM | Admit: 2022-11-02 | Discharge: 2022-11-03 | Disposition: A | Discharge status: Home / Self Care | Payer: No Typology Code available for payment source | Attending: Emergency Medicine | Admitting: Emergency Medicine

## 2022-11-02 DIAGNOSIS — Y92410 Unspecified street and highway as the place of occurrence of the external cause: Secondary | ICD-10-CM | POA: Insufficient documentation

## 2022-11-02 DIAGNOSIS — M25561 Pain in right knee: Secondary | ICD-10-CM

## 2022-11-02 DIAGNOSIS — I10 Essential (primary) hypertension: Secondary | ICD-10-CM | POA: Insufficient documentation

## 2022-11-02 DIAGNOSIS — E039 Hypothyroidism, unspecified: Secondary | ICD-10-CM | POA: Insufficient documentation
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
## 2022-11-03 ENCOUNTER — Ambulatory Visit: Payer: Medicaid Other | Admitting: Obstetrics

## 2022-11-09 ENCOUNTER — Encounter: Payer: Self-pay | Admitting: Obstetrics

## 2022-11-09 ENCOUNTER — Encounter (HOSPITAL_COMMUNITY): Payer: Self-pay

## 2022-11-09 ENCOUNTER — Emergency Department (HOSPITAL_COMMUNITY): Payer: Medicaid Other

## 2022-11-09 ENCOUNTER — Ambulatory Visit (INDEPENDENT_AMBULATORY_CARE_PROVIDER_SITE_OTHER): Payer: Medicaid Other | Admitting: Obstetrics

## 2022-11-09 ENCOUNTER — Other Ambulatory Visit: Payer: Self-pay

## 2022-11-09 ENCOUNTER — Emergency Department (HOSPITAL_COMMUNITY)
Admission: EM | Admit: 2022-11-09 | Discharge: 2022-11-09 | Disposition: A | Discharge status: Home / Self Care | Payer: Medicaid Other | Attending: Emergency Medicine | Admitting: Emergency Medicine

## 2022-11-09 VITALS — BP 156/74 | HR 59 | Ht 67.5 in | Wt 170.0 lb

## 2022-11-09 DIAGNOSIS — N393 Stress incontinence (female) (male): Secondary | ICD-10-CM

## 2022-11-09 DIAGNOSIS — M542 Cervicalgia: Secondary | ICD-10-CM | POA: Insufficient documentation

## 2022-11-09 DIAGNOSIS — Z01419 Encounter for gynecological examination (general) (routine) without abnormal findings: Secondary | ICD-10-CM | POA: Diagnosis not present

## 2022-11-09 DIAGNOSIS — Z1339 Encounter for screening examination for other mental health and behavioral disorders: Secondary | ICD-10-CM | POA: Diagnosis not present

## 2022-11-09 DIAGNOSIS — Z90710 Acquired absence of both cervix and uterus: Secondary | ICD-10-CM | POA: Diagnosis not present

## 2022-11-09 DIAGNOSIS — I159 Secondary hypertension, unspecified: Secondary | ICD-10-CM

## 2022-11-09 DIAGNOSIS — M5489 Other dorsalgia: Secondary | ICD-10-CM

## 2022-11-09 DIAGNOSIS — R030 Elevated blood-pressure reading, without diagnosis of hypertension: Secondary | ICD-10-CM | POA: Diagnosis present

## 2022-11-09 DIAGNOSIS — M546 Pain in thoracic spine: Secondary | ICD-10-CM | POA: Diagnosis not present

## 2022-11-09 DIAGNOSIS — Z1239 Encounter for other screening for malignant neoplasm of breast: Secondary | ICD-10-CM

## 2022-11-09 LAB — BASIC METABOLIC PANEL
Anion gap: 9 (ref 5–15)
BUN: 17 mg/dL (ref 8–23)
CO2: 28 mmol/L (ref 22–32)
Calcium: 9.3 mg/dL (ref 8.9–10.3)
Chloride: 100 mmol/L (ref 98–111)
Creatinine, Ser: 0.88 mg/dL (ref 0.44–1.00)
GFR, Estimated: >60 mL/min (ref >60)
Glucose, Bld: 102 mg/dL — ABNORMAL HIGH (ref 70–99)
Potassium: 3.4 mmol/L — ABNORMAL LOW (ref 3.5–5.1)
Sodium: 137 mmol/L (ref 135–145)

## 2022-11-09 LAB — CBC WITH DIFFERENTIAL/PLATELET
Abs Immature Granulocytes: 0.01 10*3/uL (ref 0.00–0.07)
Basophils Absolute: 0 10*3/uL (ref 0.0–0.1)
Basophils Relative: 1 %
Eosinophils Absolute: 0.1 10*3/uL (ref 0.0–0.5)
Eosinophils Relative: 1 %
HCT: 44.3 % (ref 36.0–46.0)
Hemoglobin: 14.3 g/dL (ref 12.0–15.0)
Immature Granulocytes: 0 %
Lymphocytes Relative: 36 %
Lymphs Abs: 1.9 10*3/uL (ref 0.7–4.0)
MCH: 27.4 pg (ref 26.0–34.0)
MCHC: 32.3 g/dL (ref 30.0–36.0)
MCV: 84.9 fL (ref 80.0–100.0)
Monocytes Absolute: 0.4 10*3/uL (ref 0.1–1.0)
Monocytes Relative: 7 %
Neutro Abs: 2.8 10*3/uL (ref 1.7–7.7)
Neutrophils Relative %: 55 %
Platelets: 250 10*3/uL (ref 150–400)
RBC: 5.22 MIL/uL — ABNORMAL HIGH (ref 3.87–5.11)
RDW: 14.5 % (ref 11.5–15.5)
WBC: 5.2 10*3/uL (ref 4.0–10.5)
nRBC: 0 % (ref 0.0–0.2)

## 2022-11-09 LAB — TROPONIN I (HIGH SENSITIVITY): Troponin I (High Sensitivity): 6 ng/L (ref <18)

## 2022-11-09 MED ORDER — POTASSIUM CHLORIDE 20 MEQ PO PACK
20.0000 meq | PACK | Freq: Two times a day (BID) | ORAL | Status: DC
  Administered 2022-11-09: 20 meq via ORAL
  Filled 2022-11-09: qty 1

## 2022-11-09 MED ORDER — OXYCODONE HCL 5 MG PO TABS
10.0000 mg | ORAL_TABLET | Freq: Once | ORAL | Status: AC
  Administered 2022-11-09: 10 mg via ORAL
  Filled 2022-11-09: qty 2

## 2022-11-09 NOTE — Progress Notes (Signed)
Subjective:        [name] is a 62 y.o. female here for a routine exam.  Current complaints: Leaking urine with full bladder sometimes.  Personal health questionnaire:  Is patient [name], have a family history of breast and/or ovarian cancer: yes Is there a family history of uterine cancer diagnosed at age < 32, gastrointestinal cancer, urinary tract cancer, family member who is a Field seismologist syndrome-associated carrier: yes Is the patient overweight and hypertensive, family history of diabetes, personal history of gestational diabetes, preeclampsia or PCOS: yes Is patient over 80, have PCOS,  family history of premature CHD under age 26, diabetes, smoke, have hypertension or peripheral artery disease:  yes At any time, has a partner hit, kicked or otherwise hurt or frightened you?: no Over the past 2 weeks, have you felt down, depressed or hopeless?: no Over the past 2 weeks, have you felt little interest or pleasure in doing things?:no   Gynecologic History No LMP recorded. Patient has had a hysterectomy. Contraception: status post hysterectomy Last Pap: unknown. Results were: normal Last mammogram: 2023. Results were: normal  Obstetric History OB History  No obstetric history on file.    Past Medical History:  Diagnosis Date   Anemia    Anxiety    Arthritis    Bipolar 1 disorder (HCC)    Bursitis    hx of   Chronic bronchitis (HCC)    Complication of anesthesia    difficult to wake up and blood pressure drops   Depression    Diverticula of intestine    Dysrhythmia    no medication treatment   Eczema    GERD (gastroesophageal reflux disease)    H. pylori infection    Headache(784.0)    Heart murmur    Hypertension    sees Dr. Montez Morita, is patients primary   Hypothyroidism    takes synthroid   Mental disorder    bipolar   Pneumonia    hx    Past Surgical History:  Procedure Laterality Date   ABDOMINAL HYSTERECTOMY     has both of ovaries    CESAREAN SECTION     COLONOSCOPY     LUMBAR LAMINECTOMY  08/22/2011   Procedure: MICRODISCECTOMY LUMBAR LAMINECTOMY;  Surgeon: Peggyann Shoals, MD;  Location: [facility];  Service: Neurosurgery;  Laterality: Right;  RIGHT Lumbar Five-Sacral One microdiskectomy   MOUTH SURGERY     MOUTH SURGERY     MULTIPLE EXTRACTIONS WITH ALVEOLOPLASTY N/A 08/20/2015   Procedure: Extraction of tooth #'s 1-12,14,20-27, 29 with alveoloplasty;  Surgeon: Lenn Cal, DDS;  Location: [place];  Service: Oral Surgery;  Laterality: N/A;   TUBAL LIGATION       Current Outpatient Medications:    amLODipine (NORVASC) 10 MG tablet, Take 1 tablet (10 mg total) by mouth daily. Take this instead of your LOTREL until you see your regular doctor: For high blood pressure (Patient taking differently: Take 10 mg by mouth daily.), Disp: 1 tablet, Rfl: 0   atenolol (TENORMIN) 50 MG tablet, Take 1 tablet (50 mg total) by mouth daily. Need Dr appt for future refills: For high blood pressure (Patient taking differently: Take 50 mg by mouth daily.), Disp: 1 tablet, Rfl: 0   atorvastatin (LIPITOR) 10 MG tablet, Take 10 mg by mouth daily., Disp: , Rfl:    citalopram (CELEXA) 20 MG tablet, Take 20 mg by mouth daily as needed (depression/anxiety)., Disp: , Rfl:    clorazepate (TRANXENE) 7.5  MG tablet, Take by mouth., Disp: , Rfl:    cycloSPORINE (RESTASIS) 0.05 % ophthalmic emulsion, Restasis 0.05 % eye drops in a dropperette  INSTILL 1 DROP INTO AFFECTED EYE(S) BY OPHTHALMIC ROUTE EVERY 12 HOURS as needed, Disp: , Rfl:    fluticasone (FLONASE) 50 MCG/ACT nasal spray, , Disp: , Rfl:    levothyroxine (SYNTHROID, LEVOTHROID) 125 MCG tablet, Take 1 tablet (125 mcg total) by mouth daily. For hypothyroidism, Disp: 1 tablet, Rfl: 0   loratadine (CLARITIN) 10 MG tablet, loratadine 10 mg tablet  TAKE 1 TABLET BY MOUTH DAILY IN THE EVENING, Disp: , Rfl:    polyethylene glycol (MIRALAX / GLYCOLAX) packet, Take 17 g by mouth daily as needed for  mild constipation., Disp: 14 each, Rfl: 0   albuterol (PROVENTIL HFA;VENTOLIN HFA) 108 (90 Base) MCG/ACT inhaler, Inhale 2 puffs into the lungs every 4 (four) hours as needed for wheezing or shortness of breath., Disp: 1 Inhaler, Rfl: 0   ALPRAZolam (XANAX) 0.25 MG tablet, alprazolam 0.25 mg tablet  Take 1 tablet 3 times a day by oral route as needed. (Patient not taking: Reported on 10/14/2021), Disp: , Rfl:    Butenafine HCl 1 % cream, Apply topically daily., Disp: , Rfl:    estradiol (CLIMARA) 0.1 mg/24hr patch, Place 1 patch (0.1 mg total) onto the skin once a week. (Patient not taking: Reported on 11/09/2022), Disp: 4 patch, Rfl: 12   hydrochlorothiazide (MICROZIDE) 12.5 MG capsule, Take 12.5 mg by mouth daily., Disp: , Rfl:    ibuprofen (ADVIL) 800 MG tablet, Take 1 tablet (800 mg total) by mouth 3 (three) times daily with meals as needed for headache or moderate pain. (Patient not taking: Reported on 11/09/2022), Disp: 30 tablet, Rfl: 5   ibuprofen (ADVIL,MOTRIN) 800 MG tablet, TAKE 1 TABLET (800 MG TOTAL) BY MOUTH EVERY EIGHT HOURS AS NEEDED. (Patient not taking: Reported on 11/09/2022), Disp: 30 tablet, Rfl: 5   loperamide (IMODIUM) 2 MG capsule, Take 1 capsule (2 mg total) by mouth 4 (four) times daily as needed for diarrhea or loose stools. (Patient not taking: Reported on 11/09/2022), Disp: 12 capsule, Rfl: 0   meclizine (ANTIVERT) 25 MG tablet, Take 1 tablet (25 mg total) by mouth 2 (two) times daily. (Patient not taking: Reported on 11/09/2022), Disp: 30 tablet, Rfl: 0   neomycin-bacitracin-polymyxin (NEOSPORIN) OINT, Apply 1 application topically 2 (two) times daily as needed (for skin irritation on hands).  (Patient not taking: Reported on 04/12/2020), Disp: , Rfl:    Olopatadine HCl 0.2 % SOLN, olopatadine 0.2 % eye drops  INSTILL 1 DROP INTO AFFECTED EYE(S) BY OPHTHALMIC ROUTE ONCE DAILY, Disp: , Rfl:    ondansetron (ZOFRAN) 4 MG tablet, Take 1 tablet (4 mg total) by mouth every 6 (six) hours.  (Patient not taking: Reported on 04/12/2020), Disp: 12 tablet, Rfl: 0   ondansetron (ZOFRAN-ODT) 8 MG disintegrating tablet, Take 1 tablet (8 mg total) by mouth every 8 (eight) hours as needed for nausea or vomiting. (Patient not taking: Reported on 11/09/2022), Disp: 12 tablet, Rfl: 0   Oxycodone HCl 10 MG TABS, Take 10 mg by mouth every 6 (six) hours as needed. (Patient not taking: Reported on 11/09/2022), Disp: , Rfl:    oxyCODONE-acetaminophen (PERCOCET) 10-325 MG tablet, Take 1 tablet by mouth every 4 (four) hours as needed for pain. (Patient not taking: Reported on 11/09/2022), Disp: 30 tablet, Rfl: 0   polycarbophil (FIBERCON) 625 MG tablet, Take 1 tablet (625 mg total) by mouth daily as  needed (for regularity). Constipation (Patient not taking: Reported on 11/09/2022), Disp: , Rfl:    potassium chloride SA (KLOR-CON M) 20 MEQ tablet, Take 1 tablet (20 mEq total) by mouth 2 (two) times daily for 7 days., Disp: 14 tablet, Rfl: 0   predniSONE (DELTASONE) 20 MG tablet, 3 tabs po day one, then 2 po daily x 4 days (Patient not taking: Reported on 11/09/2022), Disp: 11 tablet, Rfl: 0   QUEtiapine (SEROQUEL) 25 MG tablet, Seroquel 25 mg tablet  Take 1 tablet(s) every day by oral route at bedtime., Disp: , Rfl:    terconazole (TERAZOL 7) 0.4 % vaginal cream, PLACE 1 APPLICATOR VAGINALLY AT BEDTIME. (Patient not taking: Reported on 11/09/2022), Disp: 45 g, Rfl: 0   tinidazole (TINDAMAX) 500 MG tablet, Take 2 tablets (1,000 mg total) by mouth daily with breakfast. (Patient not taking: Reported on 11/09/2022), Disp: 10 tablet, Rfl: 2   tiotropium (SPIRIVA HANDIHALER) 18 MCG inhalation capsule, Spiriva with HandiHaler 18 mcg and inhalation capsules  Inhale 1 capsule every day by inhalation route for 30 days., Disp: , Rfl:    traMADol (ULTRAM) 50 MG tablet, Ultram 50 mg tablet  Take 1 tablet every 6 hours by oral route as needed for 5 days. (Patient not taking: Reported on 11/09/2022), Disp: , Rfl:    triamcinolone ointment  (KENALOG) 0.1 %, SMARTSIG:Sparingly Topical Twice Daily (Patient not taking: Reported on 11/09/2022), Disp: , Rfl:  Allergies  Allergen Reactions   Benazepril Anaphylaxis, Hives and Itching   Penicillins Hives, Itching and Swelling    Face: itching/swelling Has patient had a PCN reaction causing immediate rash, facial/tongue/throat swelling, SOB or lightheadedness with hypotension: Yes Has patient had a PCN reaction causing severe rash involving mucus membranes or skin necrosis: Unk Has patient had a PCN reaction that required hospitalization: No Has patient had a PCN reaction occurring within the last 10 years: No If all of the above answers are "NO", then may proceed with Cephalosporin use.    Sulfa Antibiotics Hives    Itching and facial swelling   Ciprofloxacin Hcl Hives    "funny sensation in gums"   Clindamycin/Lincomycin Hives and Itching    Welts (also)   Erythromycin Hives and Itching   Hydrocodone-Acetaminophen Hives   Other     Pt has problems with being awaken from anesthesia, Her blood pressure drops very low   Zithromax [Azithromycin] Hives and Itching   Sulfamethoxazole Rash   Tape Itching and Rash    Social History   Tobacco Use   Smoking status: Every Day    Packs/day: 4.00    Years: 20.00    Total pack years: 80.00    Types: Cigarettes   Smokeless tobacco: Never   Tobacco comments:    4-6 cigs/day  Substance Use Topics   Alcohol use: Not Currently    Comment: occ    Family History  Problem Relation Age of Onset   Anesthesia problems Mother    Cancer Mother        thyroid   Hypertension Mother    Breast cancer Mother 91   Anesthesia problems Sister    Hypertension Sister    Cancer Maternal Aunt    Cancer Maternal Aunt    Colon cancer Sister    Stroke Sister    Hypertension Brother       Review of Systems  Constitutional: negative for fatigue and weight loss Respiratory: negative for cough and wheezing Cardiovascular: negative for chest  pain, fatigue and palpitations Gastrointestinal:  negative for abdominal pain and change in bowel habits Musculoskeletal:negative for myalgias Neurological: negative for gait problems and tremors Behavioral/Psych: negative for abusive relationship, depression Endocrine: negative for temperature intolerance    Genitourinary: positive for SUI.  negative for abnormal menstrual periods, genital lesions, hot flashes, sexual problems and vaginal discharge Integument/breast: negative for breast lump, breast tenderness, nipple discharge and skin lesion(s)    Objective:       BP (!) 156/74 (BP Location: Left Arm, Cuff Size: Large)   Pulse (!) 59   Ht 5' 7.5" (1.715 m)   Wt 170 lb (77.1 kg)   BMI 26.23 kg/m  General:   Alert and no distress  Skin:   no rash or abnormalities  Lungs:   clear to auscultation bilaterally  Heart:   regular rate and rhythm, S1, S2 normal, no murmur, click, rub or gallop  Breasts:   normal without suspicious masses, skin or nipple changes or axillary nodes  Abdomen:  normal findings: no organomegaly, soft, non-tender and no hernia  Pelvis:  External genitalia: normal general appearance Urinary system: urethral meatus normal and bladder without fullness, nontender Vaginal: normal without tenderness, induration or masses Cervix: normal appearance Adnexa: normal bimanual exam Uterus: anteverted and non-tender, normal size   Lab Review Urine pregnancy test Labs reviewed yes Radiologic studies reviewed yes  I have spent a total of 20 minutes of face-to-face time, excluding clinical staff time, reviewing notes and preparing to see patient, ordering tests and/or medications, and counseling the patient.   Assessment:    1. Women's annual routine gynecological examination [id number]  2. History of total abdominal hysterectomy  3. SUI (stress urinary incontinence, female) - discussed Kegel exercises.  Educational material dispensed in AVS - medication and possibly  surgery may be necessary in the future   4. Screening breast examination Rx: - MM 3D SCREENING MAMMOGRAM BILATERAL BREAST; Future     Plan:    Education reviewed: calcium supplements, depression evaluation, low fat, low cholesterol diet, safe sex/STD prevention, self breast exams, smoking cessation, and weight bearing exercise. Mammogram ordered. Follow up in: 1 year.    Orders Placed This Encounter  Procedures   MM Digital Screening    Standing Status:   Future    Standing Expiration Date:   11/09/2023    Order Specific Question:   Reason for Exam (SYMPTOM  OR DIAGNOSIS REQUIRED)    Answer:   Screening    Order Specific Question:   Preferred imaging location?    Answer:   [facility]   Shelly Bombard, MD 11/09/2022 9:13 AM
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
## 2022-11-09 NOTE — Progress Notes (Signed)
62 y.o GYN presents for AEX.  HO Hysterectomy.  Last Mammogram 12/12/2021 next due 2025. C/o 2 bruises on lower abdomin, pain
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
## 2022-11-09 NOTE — ED Notes (Signed)
Discharge instructions reviewed, questions answered. Pt states understanding and no further questions. Pt wheeled out in wheelchair upon discharge. No s/s of distress noted.

## 2022-12-28 ENCOUNTER — Ambulatory Visit: Payer: Medicaid Other

## 2022-12-28 ENCOUNTER — Ambulatory Visit
Admission: RE | Admit: 2022-12-28 | Discharge: 2022-12-28 | Disposition: A | Discharge status: Home / Self Care | Payer: Medicaid Other | Source: Ambulatory Visit | Attending: Obstetrics | Admitting: Obstetrics

## 2022-12-28 DIAGNOSIS — Z1239 Encounter for other screening for malignant neoplasm of breast: Secondary | ICD-10-CM

## 2023-01-09 ENCOUNTER — Ambulatory Visit: Payer: Medicaid Other

## 2023-12-24 ENCOUNTER — Other Ambulatory Visit: Payer: Self-pay | Admitting: Endocrinology

## 2023-12-24 ENCOUNTER — Encounter: Payer: Self-pay | Admitting: Dermatology

## 2023-12-24 DIAGNOSIS — Z1231 Encounter for screening mammogram for malignant neoplasm of breast: Secondary | ICD-10-CM

## 2024-01-09 ENCOUNTER — Ambulatory Visit
Admission: RE | Admit: 2024-01-09 | Discharge: 2024-01-09 | Disposition: A | Discharge status: Home / Self Care | Payer: MEDICAID | Source: Ambulatory Visit | Attending: Endocrinology | Admitting: Endocrinology

## 2024-01-09 DIAGNOSIS — Z1231 Encounter for screening mammogram for malignant neoplasm of breast: Secondary | ICD-10-CM

## 2024-03-11 ENCOUNTER — Emergency Department (HOSPITAL_COMMUNITY)
Admission: EM | Admit: 2024-03-11 | Discharge: 2024-03-11 | Disposition: A | Discharge status: Home / Self Care | Payer: MEDICAID | Attending: Emergency Medicine | Admitting: Emergency Medicine

## 2024-03-11 ENCOUNTER — Encounter (HOSPITAL_COMMUNITY): Payer: Self-pay | Admitting: *Deleted

## 2024-03-11 ENCOUNTER — Other Ambulatory Visit: Payer: Self-pay

## 2024-03-11 DIAGNOSIS — H9201 Otalgia, right ear: Secondary | ICD-10-CM | POA: Insufficient documentation

## 2024-03-11 DIAGNOSIS — J302 Other seasonal allergic rhinitis: Secondary | ICD-10-CM | POA: Diagnosis not present

## 2024-03-11 DIAGNOSIS — R0981 Nasal congestion: Secondary | ICD-10-CM | POA: Diagnosis present

## 2024-03-11 MED ORDER — TRIAMCINOLONE ACETONIDE 55 MCG/ACT NA AERO: 2.0000 | INHALATION_SPRAY | Freq: Every day | NASAL | 12 refills | Status: AC

## 2024-03-11 MED ORDER — SALINE SPRAY 0.65 % NA SOLN: 1.0000 | NASAL | 0 refills | Status: AC | PRN

## 2024-03-11 MED ORDER — CETIRIZINE HCL 10 MG PO TABS: 10.0000 mg | ORAL_TABLET | Freq: Every day | ORAL | 0 refills | Status: AC

## 2024-03-11 NOTE — ED Triage Notes (Signed)
 Here by POV from home for sinus fullness in frontal sinuses. Also associated R ear muffled/ intermittently clogged, and nasal drainage. States, equilibrium intermittently off. Similar to when I had vertigo and the prescribed meclizine . Last used meclizine  a year ago. Denies pain, HA, ear pain, sore throat, sob, NVD, syncope, or bleeding. No meds PTA. Alert, NAD, calm, interactive, resps e/u, speaking clearly.

## 2024-03-11 NOTE — ED Provider Notes (Signed)
 [place] [facility]ERGENC[facility] DEPARTMENT AT [facility] Provider Note   CSN: [id number] Arrival date & time: 03/11/24  1651     Patient presents with: Nasal Congestion   [name] is a 62 y.o. female who presents here primarily for nasal congestion, right ear pain and pressure, and intermittent dizziness that primarily occurs with rotation of the head.  She has had persistent nasal congestion over the last week, also has rhinorrhea as well as postnasal drip.  She has had issues with dizziness in the past and had to use meclizine  to manage.   HPI     Prior to Admission medications   Medication Sig Start Date End Date Taking? Authorizing Provider  cetirizine  (ZYRTEC  ALLERGY) 10 MG tablet Take 1 tablet (10 mg total) by mouth daily. 03/11/24  Yes Myriam Dorn BROCKS, PA  sodium chloride  (OCEAN) 0.65 % SOLN nasal spray Place 1 spray into both nostrils as needed for congestion. 03/11/24  Yes Myriam Dorn BROCKS, PA  triamcinolone  (NASACORT ) 55 MCG/ACT AERO nasal inhaler Place 2 sprays into the nose daily. 03/11/24  Yes Myriam Dorn BROCKS, PA  albuterol  (PROVENTIL  HFA;VENTOLIN  HFA) 108 (90 Base) MCG/ACT inhaler Inhale 2 puffs into the lungs every 4 (four) hours as needed for wheezing or shortness of breath. 04/04/16   Collene Gouge I, NP  ALPRAZolam (XANAX) 0.25 MG tablet alprazolam 0.25 mg tablet  Take 1 tablet 3 times a day by oral route as needed. Patient not taking: Reported on 10/14/2021    [provider]  amLODipine  (NORVASC ) 10 MG tablet Take 1 tablet (10 mg total) by mouth daily. Take this instead of your LOTREL until you see your regular doctor: For high blood pressure Patient taking differently: Take 10 mg by mouth daily. 04/04/16   Collene Gouge I, NP  atenolol  (TENORMIN ) 50 MG tablet Take 1 tablet (50 mg total) by mouth daily. Need Dr appt for future refills: For high blood pressure Patient taking differently: Take 50 mg by mouth daily. 04/04/16   Collene Gouge I, NP   atorvastatin (LIPITOR) 10 MG tablet Take 10 mg by mouth daily.    [provider]  Butenafine HCl 1 % cream Apply topically daily. 01/29/20   [provider]  citalopram (CELEXA) 20 MG tablet Take 20 mg by mouth daily as needed (depression/anxiety).    [provider]  clorazepate  (TRANXENE ) 7.5 MG tablet Take by mouth.    [provider]  cycloSPORINE (RESTASIS) 0.05 % ophthalmic emulsion Restasis 0.05 % eye drops in a dropperette  INSTILL 1 DROP INTO AFFECTED EYE(S) BY OPHTHALMIC ROUTE EVERY 12 HOURS as needed    [provider]  estradiol  (CLIMARA ) 0.1 mg/24hr patch Place 1 patch (0.1 mg total) onto the skin once a week. Patient not taking: Reported on 11/09/2022 10/14/21   Rudy Carlin LABOR, MD  fluticasone  (FLONASE ) 50 MCG/ACT nasal spray     [provider]  hydrochlorothiazide (MICROZIDE) 12.5 MG capsule Take 12.5 mg by mouth daily.    [provider]  ibuprofen  (ADVIL ) 800 MG tablet Take 1 tablet (800 mg total) by mouth 3 (three) times daily with meals as needed for headache or moderate pain. Patient not taking: Reported on 11/09/2022 10/14/21   Rudy Carlin LABOR, MD  ibuprofen  (ADVIL ,MOTRIN ) 800 MG tablet TAKE 1 TABLET (800 MG TOTAL) BY MOUTH EVERY EIGHT HOURS AS NEEDED. Patient not taking: Reported on 11/09/2022 10/26/17   Rudy Carlin LABOR, MD  levothyroxine  (SYNTHROID , LEVOTHROID) 125 MCG tablet Take 1  tablet (125 mcg total) by mouth daily. For hypothyroidism 04/04/16   Collene Gouge I, NP  loperamide  (IMODIUM ) 2 MG capsule Take 1 capsule (2 mg total) by mouth 4 (four) times daily as needed for diarrhea or loose stools. Patient not taking: Reported on 11/09/2022 08/08/22   Randol Simmonds, MD  loratadine (CLARITIN) 10 MG tablet loratadine 10 mg tablet  TAKE 1 TABLET BY MOUTH DAILY IN THE EVENING    [provider]  meclizine  (ANTIVERT ) 25 MG tablet Take 1 tablet (25 mg total) by mouth 2 (two) times daily. Patient not taking:  Reported on 11/09/2022 02/24/21   Horton, Kristie M, DO  neomycin-bacitracin -polymyxin (NEOSPORIN) OINT Apply 1 application topically 2 (two) times daily as needed (for skin irritation on hands).  Patient not taking: Reported on 04/12/2020    [provider]  Olopatadine HCl 0.2 % SOLN olopatadine 0.2 % eye drops  INSTILL 1 DROP INTO AFFECTED EYE(S) BY OPHTHALMIC ROUTE ONCE DAILY    [provider]  ondansetron  (ZOFRAN ) 4 MG tablet Take 1 tablet (4 mg total) by mouth every 6 (six) hours. Patient not taking: Reported on 04/12/2020 10/04/16   Griselda Norris, MD  ondansetron  (ZOFRAN -ODT) 8 MG disintegrating tablet Take 1 tablet (8 mg total) by mouth every 8 (eight) hours as needed for nausea or vomiting. Patient not taking: Reported on 11/09/2022 08/08/22   Randol Simmonds, MD  Oxycodone  HCl 10 MG TABS Take 10 mg by mouth every 6 (six) hours as needed. Patient not taking: Reported on 11/09/2022 03/03/20   [provider]  oxyCODONE -acetaminophen  (PERCOCET) 10-325 MG tablet Take 1 tablet by mouth every 4 (four) hours as needed for pain. Patient not taking: Reported on 11/09/2022 02/05/17   Rudy Carlin LABOR, MD  polycarbophil (FIBERCON) 625 MG tablet Take 1 tablet (625 mg total) by mouth daily as needed (for regularity). Constipation Patient not taking: Reported on 11/09/2022 04/04/16   Collene Gouge I, NP  polyethylene glycol (MIRALAX  / GLYCOLAX ) packet Take 17 g by mouth daily as needed for mild constipation. 04/04/16   Collene Gouge I, NP  potassium chloride  SA (KLOR-CON  M) 20 MEQ tablet Take 1 tablet (20 mEq total) by mouth 2 (two) times daily for 7 days. 08/08/22 08/15/22  Randol Simmonds, MD  predniSONE  (DELTASONE ) 20 MG tablet 3 tabs po day one, then 2 po daily x 4 days Patient not taking: Reported on 11/09/2022 06/09/22   Lenor Hollering, MD  QUEtiapine  (SEROQUEL ) 25 MG tablet Seroquel  25 mg tablet  Take 1 tablet(s) every day by oral route at bedtime.    [provider]  terconazole  (TERAZOL 7 )  0.4 % vaginal cream PLACE 1 APPLICATOR VAGINALLY AT BEDTIME. Patient not taking: Reported on 11/09/2022 07/12/22   Rudy Carlin LABOR, MD  tinidazole  (TINDAMAX ) 500 MG tablet Take 2 tablets (1,000 mg total) by mouth daily with breakfast. Patient not taking: Reported on 11/09/2022 04/13/20   Rudy Carlin LABOR, MD  tiotropium (SPIRIVA HANDIHALER) 18 MCG inhalation capsule Spiriva with HandiHaler 18 mcg and inhalation capsules  Inhale 1 capsule every day by inhalation route for 30 days.    [provider]  traMADol (ULTRAM) 50 MG tablet Ultram 50 mg tablet  Take 1 tablet every 6 hours by oral route as needed for 5 days. Patient not taking: Reported on 11/09/2022    [provider]  triamcinolone  ointment (KENALOG ) 0.1 % SMARTSIG:Sparingly Topical Twice Daily Patient not taking: Reported on 11/09/2022 12/21/19   [provider]    Allergies:  Benazepril, Penicillins, Sulfa antibiotics, Ciprofloxacin hcl, Clindamycin/lincomycin, Erythromycin, Hydrocodone -acetaminophen , Other, Zithromax [azithromycin], Sulfamethoxazole, and Tape    Review of Systems  HENT:  Positive for postnasal drip and rhinorrhea. Negative for hearing loss.   Neurological:  Positive for dizziness.  All other systems reviewed and are negative.   Updated Vital Signs BP (!) 199/80 (BP Location: Right Arm)   Pulse 64   Temp 98 F (36.7 C) (Oral)   Resp 18   Wt 78 kg   SpO2 100%   BMI 26.54 kg/m   Physical Exam Vitals and nursing note reviewed.  Constitutional:      General: She is not in acute distress.    Appearance: She is well-developed.  HENT:     Head: Normocephalic and atraumatic.     Right Ear: Hearing, tympanic membrane, ear canal and external ear normal.     Left Ear: Hearing, tympanic membrane, ear canal and external ear normal.     Nose: Septal deviation and mucosal edema present. No nasal deformity or nasal tenderness.     Right Turbinates: Enlarged.     Comments: Moderate right-sided  deviation is noted along with increased mucosal edema on the right, copious clear postnasal drip is appreciated.  Otherwise turbinates appear normal. Eyes:     General: Lids are normal. Vision grossly intact. Gaze aligned appropriately.     Conjunctiva/sclera: Conjunctivae normal.  Cardiovascular:     Rate and Rhythm: Normal rate and regular rhythm.     Heart sounds: No murmur heard. Pulmonary:     Effort: Pulmonary effort is normal. No respiratory distress.     Breath sounds: Normal breath sounds.  Abdominal:     Palpations: Abdomen is soft.     Tenderness: There is no abdominal tenderness.  Musculoskeletal:        General: No swelling.     Cervical back: Neck supple.  Skin:    General: Skin is warm and dry.     Capillary Refill: Capillary refill takes less than 2 seconds.  Neurological:     General: No focal deficit present.     Mental Status: She is alert and oriented to person, place, and time.     GCS: GCS eye subscore is 4. GCS verbal subscore is 5. GCS motor subscore is 6.     Motor: Motor function is intact.     Coordination: Coordination is intact.     Gait: Gait is intact.  Psychiatric:        Mood and Affect: Mood normal.     (all labs ordered are listed, but only abnormal results are displayed) Labs Reviewed - No data to display  EKG: None  Radiology: No results found.   Procedures   Medications Ordered in the ED - No data to display                                  Medical Decision Making Risk OTC drugs.   Medical Decision Making:   [name] is a 63 y.o. female who presented to the ED today with nasal congestion and ear pressure along with dizziness detailed above.     Complete initial physical exam performed, notably the patient  was alert and oriented no apparent distress.  There is no nystagmus, nasal turbinates on the right are enlarged there is copious postnasal drip appreciated, bilateral TMs appear normal..    Reviewed and confirmed  nursing documentation for  past medical history, family history, social history.    Initial Assessment:   With the patient's presentation of nasal congestion and intermittent dizziness, differential includes viral illness of the upper respiratory tract, seasonal allergic rhinitis, benign positional paroxysmal vertigo..     Initial Plan:  Based on clinical presentation and review of patient's history, will defer further workup at this time. Objective evaluation as below reviewed    Reassessment and Plan:   Based on assessment this patient, believe this is consistent with seasonal allergic rhinitis causing intermittent alternobaric vertigo.  As such we will manage with triamcinolone  nasal spray, oral cetirizine , and intermittent nasal rinses.  Refer back to primary care for continued monitoring of this patient's condition, if vertigo continues despite ongoing treatment may refer to ENT for further evaluation.       Final diagnoses:  Seasonal allergic rhinitis, unspecified trigger    ED Discharge Orders          Ordered    triamcinolone  (NASACORT ) 55 MCG/ACT AERO nasal inhaler  Daily        03/11/24 2039    cetirizine  (ZYRTEC  ALLERGY) 10 MG tablet  Daily        03/11/24 2039    sodium chloride  (OCEAN) 0.65 % SOLN nasal spray  As needed        03/11/24 2039               Myriam Dorn BROCKS, [place] 03/11/24 2309    Mannie Pac T, DO 03/12/24 [phone]

## 2024-05-01 ENCOUNTER — Other Ambulatory Visit: Payer: Self-pay

## 2024-05-01 ENCOUNTER — Emergency Department (HOSPITAL_COMMUNITY)
Admission: EM | Admit: 2024-05-01 | Discharge: 2024-05-01 | Disposition: A | Discharge status: Home / Self Care | Payer: MEDICAID | Source: Ambulatory Visit | Attending: Emergency Medicine | Admitting: Emergency Medicine

## 2024-05-01 ENCOUNTER — Encounter (HOSPITAL_COMMUNITY): Payer: Self-pay | Admitting: Emergency Medicine

## 2024-05-01 DIAGNOSIS — I10 Essential (primary) hypertension: Secondary | ICD-10-CM | POA: Diagnosis not present

## 2024-05-01 DIAGNOSIS — Z79899 Other long term (current) drug therapy: Secondary | ICD-10-CM | POA: Insufficient documentation

## 2024-05-01 DIAGNOSIS — R519 Headache, unspecified: Secondary | ICD-10-CM | POA: Diagnosis present

## 2024-05-01 LAB — URINALYSIS, ROUTINE W REFLEX MICROSCOPIC
Bacteria, UA: NONE SEEN
Bilirubin Urine: NEGATIVE
Glucose, UA: NEGATIVE mg/dL
Ketones, ur: NEGATIVE mg/dL
Leukocytes,Ua: NEGATIVE
Nitrite: NEGATIVE
Protein, ur: NEGATIVE mg/dL
Specific Gravity, Urine: 1.002 — ABNORMAL LOW (ref 1.005–1.030)
pH: 6 (ref 5.0–8.0)

## 2024-05-01 LAB — COMPREHENSIVE METABOLIC PANEL WITH GFR
ALT: 13 U/L (ref 0–44)
AST: 21 U/L (ref 15–41)
Albumin: 4.1 g/dL (ref 3.5–5.0)
Alkaline Phosphatase: 118 U/L (ref 38–126)
Anion gap: 12 (ref 5–15)
BUN: 8 mg/dL (ref 8–23)
CO2: 24 mmol/L (ref 22–32)
Calcium: 9.4 mg/dL (ref 8.9–10.3)
Chloride: 102 mmol/L (ref 98–111)
Creatinine, Ser: 0.89 mg/dL (ref 0.44–1.00)
GFR, Estimated: >60 mL/min (ref >60)
Glucose, Bld: 121 mg/dL — ABNORMAL HIGH (ref 70–99)
Potassium: 3.9 mmol/L (ref 3.5–5.1)
Sodium: 138 mmol/L (ref 135–145)
Total Bilirubin: 0.8 mg/dL (ref 0.0–1.2)
Total Protein: 6.7 g/dL (ref 6.5–8.1)

## 2024-05-01 LAB — CBC WITH DIFFERENTIAL/PLATELET
Abs Immature Granulocytes: 0.01 K/uL (ref 0.00–0.07)
Basophils Absolute: 0 K/uL (ref 0.0–0.1)
Basophils Relative: 1 %
Eosinophils Absolute: 0 K/uL (ref 0.0–0.5)
Eosinophils Relative: 1 %
HCT: 42.4 % (ref 36.0–46.0)
Hemoglobin: 12.9 g/dL (ref 12.0–15.0)
Immature Granulocytes: 0 %
Lymphocytes Relative: 44 %
Lymphs Abs: 2 K/uL (ref 0.7–4.0)
MCH: 26.1 pg (ref 26.0–34.0)
MCHC: 30.4 g/dL (ref 30.0–36.0)
MCV: 85.8 fL (ref 80.0–100.0)
Monocytes Absolute: 0.3 K/uL (ref 0.1–1.0)
Monocytes Relative: 7 %
Neutro Abs: 2.2 K/uL (ref 1.7–7.7)
Neutrophils Relative %: 47 %
Platelets: 259 K/uL (ref 150–400)
RBC: 4.94 MIL/uL (ref 3.87–5.11)
RDW: 14.9 % (ref 11.5–15.5)
WBC: 4.5 K/uL (ref 4.0–10.5)
nRBC: 0 % (ref 0.0–0.2)

## 2024-05-01 MED ORDER — LABETALOL HCL 5 MG/ML IV SOLN
10.0000 mg | Freq: Once | INTRAVENOUS | Status: AC
  Administered 2024-05-01: 10 mg via INTRAVENOUS
  Filled 2024-05-01: qty 4

## 2024-05-01 MED ORDER — HYDROCHLOROTHIAZIDE 12.5 MG PO TABS
25.0000 mg | ORAL_TABLET | Freq: Once | ORAL | Status: AC
  Administered 2024-05-01: 25 mg via ORAL
  Filled 2024-05-01: qty 2

## 2024-05-01 MED ORDER — LOSARTAN POTASSIUM 50 MG PO TABS: 50.0000 mg | ORAL_TABLET | Freq: Two times a day (BID) | ORAL | 0 refills | Status: DC

## 2024-05-01 MED ORDER — LOSARTAN POTASSIUM 50 MG PO TABS: 50.0000 mg | ORAL_TABLET | Freq: Two times a day (BID) | ORAL | 0 refills | Status: AC

## 2024-05-01 MED ORDER — LOSARTAN POTASSIUM 50 MG PO TABS
50.0000 mg | ORAL_TABLET | Freq: Once | ORAL | Status: AC
  Administered 2024-05-01: 50 mg via ORAL
  Filled 2024-05-01: qty 1

## 2024-05-01 NOTE — ED Provider Notes (Signed)
 [place] EMERGENCY DEPARTMENT AT [facility] Provider Note   CSN: [id number] Arrival date & time: 05/01/24  1306     Patient presents with: Hypertension   [name] is a 63 y.o. female.   This is a pleasant 63 year old female is here today for hypertension.  Patient was seen her physical therapist, had her blood pressure checked and was told that it was high.  She reports that she felt like she had little bit of a headache last evening, but did not think much of it.  Has not had any headache, chest pain, shortness of breath or vision trouble today.  She tells me that her PCP has recently made some changes with her medications, taking her off of amlodipine  and hydrochlorothiazide  and starting her on 50 of losartan  daily.   Hypertension       Prior to Admission medications   Medication Sig Start Date End Date Taking? Authorizing Provider  losartan  (COZAAR ) 50 MG tablet Take 1 tablet (50 mg total) by mouth in the morning and at bedtime. 05/01/24 05/31/24 Yes Mannie Pac T, DO  albuterol  (PROVENTIL  HFA;VENTOLIN  HFA) 108 (90 Base) MCG/ACT inhaler Inhale 2 puffs into the lungs every 4 (four) hours as needed for wheezing or shortness of breath. 04/04/16   Collene Gouge I, NP  ALPRAZolam (XANAX) 0.25 MG tablet alprazolam 0.25 mg tablet  Take 1 tablet 3 times a day by oral route as needed. Patient not taking: Reported on 10/14/2021    [provider]  amLODipine  (NORVASC ) 10 MG tablet Take 1 tablet (10 mg total) by mouth daily. Take this instead of your LOTREL until you see your regular doctor: For high blood pressure Patient taking differently: Take 10 mg by mouth daily. 04/04/16   Collene Gouge I, NP  atenolol  (TENORMIN ) 50 MG tablet Take 1 tablet (50 mg total) by mouth daily. Need Dr appt for future refills: For high blood pressure Patient taking differently: Take 50 mg by mouth daily. 04/04/16   Collene Gouge I, NP  atorvastatin (LIPITOR) 10 MG tablet Take 10 mg by  mouth daily.    [provider]  Butenafine HCl 1 % cream Apply topically daily. 01/29/20   [provider]  cetirizine  (ZYRTEC  ALLERGY) 10 MG tablet Take 1 tablet (10 mg total) by mouth daily. 03/11/24   Myriam Dorn BROCKS, PA  citalopram (CELEXA) 20 MG tablet Take 20 mg by mouth daily as needed (depression/anxiety).    [provider]  clorazepate  (TRANXENE ) 7.5 MG tablet Take by mouth.    [provider]  cycloSPORINE (RESTASIS) 0.05 % ophthalmic emulsion Restasis 0.05 % eye drops in a dropperette  INSTILL 1 DROP INTO AFFECTED EYE(S) BY OPHTHALMIC ROUTE EVERY 12 HOURS as needed    [provider]  estradiol  (CLIMARA ) 0.1 mg/24hr patch Place 1 patch (0.1 mg total) onto the skin once a week. Patient not taking: Reported on 11/09/2022 10/14/21   Rudy Carlin LABOR, MD  fluticasone  (FLONASE ) 50 MCG/ACT nasal spray     [provider]  hydrochlorothiazide  (MICROZIDE ) 12.5 MG capsule Take 12.5 mg by mouth daily.    [provider]  ibuprofen  (ADVIL ) 800 MG tablet Take 1 tablet (800 mg total) by mouth 3 (three) times daily with meals as needed for headache or moderate pain. Patient not taking: Reported on 11/09/2022 10/14/21   Rudy Carlin LABOR, MD  ibuprofen  (ADVIL ,MOTRIN ) 800 MG tablet TAKE 1 TABLET (800 MG TOTAL) BY MOUTH EVERY EIGHT HOURS AS NEEDED. Patient  not taking: Reported on 11/09/2022 10/26/17   Rudy Carlin LABOR, MD  levothyroxine  (SYNTHROID , LEVOTHROID) 125 MCG tablet Take 1 tablet (125 mcg total) by mouth daily. For hypothyroidism 04/04/16   Collene Gouge I, NP  loperamide  (IMODIUM ) 2 MG capsule Take 1 capsule (2 mg total) by mouth 4 (four) times daily as needed for diarrhea or loose stools. Patient not taking: Reported on 11/09/2022 08/08/22   Randol Simmonds, MD  loratadine (CLARITIN) 10 MG tablet loratadine 10 mg tablet  TAKE 1 TABLET BY MOUTH DAILY IN THE EVENING    [provider]  meclizine  (ANTIVERT ) 25 MG tablet Take 1 tablet (25  mg total) by mouth 2 (two) times daily. Patient not taking: Reported on 11/09/2022 02/24/21   Horton, Kristie M, DO  neomycin-bacitracin -polymyxin (NEOSPORIN) OINT Apply 1 application topically 2 (two) times daily as needed (for skin irritation on hands).  Patient not taking: Reported on 04/12/2020    [provider]  Olopatadine HCl 0.2 % SOLN olopatadine 0.2 % eye drops  INSTILL 1 DROP INTO AFFECTED EYE(S) BY OPHTHALMIC ROUTE ONCE DAILY    [provider]  ondansetron  (ZOFRAN ) 4 MG tablet Take 1 tablet (4 mg total) by mouth every 6 (six) hours. Patient not taking: Reported on 04/12/2020 10/04/16   Griselda Norris, MD  ondansetron  (ZOFRAN -ODT) 8 MG disintegrating tablet Take 1 tablet (8 mg total) by mouth every 8 (eight) hours as needed for nausea or vomiting. Patient not taking: Reported on 11/09/2022 08/08/22   Randol Simmonds, MD  Oxycodone  HCl 10 MG TABS Take 10 mg by mouth every 6 (six) hours as needed. Patient not taking: Reported on 11/09/2022 03/03/20   [provider]  oxyCODONE -acetaminophen  (PERCOCET) 10-325 MG tablet Take 1 tablet by mouth every 4 (four) hours as needed for pain. Patient not taking: Reported on 11/09/2022 02/05/17   Rudy Carlin LABOR, MD  polycarbophil (FIBERCON) 625 MG tablet Take 1 tablet (625 mg total) by mouth daily as needed (for regularity). Constipation Patient not taking: Reported on 11/09/2022 04/04/16   Collene Gouge I, NP  polyethylene glycol (MIRALAX  / GLYCOLAX ) packet Take 17 g by mouth daily as needed for mild constipation. 04/04/16   Collene Gouge I, NP  potassium chloride  SA (KLOR-CON  M) 20 MEQ tablet Take 1 tablet (20 mEq total) by mouth 2 (two) times daily for 7 days. 08/08/22 08/15/22  Randol Simmonds, MD  predniSONE  (DELTASONE ) 20 MG tablet 3 tabs po day one, then 2 po daily x 4 days Patient not taking: Reported on 11/09/2022 06/09/22   Lenor Hollering, MD  QUEtiapine  (SEROQUEL ) 25 MG tablet Seroquel  25 mg tablet  Take 1 tablet(s) every day by oral route at  bedtime.    [provider]  sodium chloride  (OCEAN) 0.65 % SOLN nasal spray Place 1 spray into both nostrils as needed for congestion. 03/11/24   Myriam Dorn BROCKS, PA  terconazole  (TERAZOL 7 ) 0.4 % vaginal cream PLACE 1 APPLICATOR VAGINALLY AT BEDTIME. Patient not taking: Reported on 11/09/2022 07/12/22   Rudy Carlin LABOR, MD  tinidazole  (TINDAMAX ) 500 MG tablet Take 2 tablets (1,000 mg total) by mouth daily with breakfast. Patient not taking: Reported on 11/09/2022 04/13/20   Rudy Carlin LABOR, MD  tiotropium (SPIRIVA HANDIHALER) 18 MCG inhalation capsule Spiriva with HandiHaler 18 mcg and inhalation capsules  Inhale 1 capsule every day by inhalation route for 30 days.    [provider]  traMADol (ULTRAM) 50 MG tablet Ultram 50 mg tablet  Take 1 tablet every 6  hours by oral route as needed for 5 days. Patient not taking: Reported on 11/09/2022    [provider]  triamcinolone  (NASACORT ) 55 MCG/ACT AERO nasal inhaler Place 2 sprays into the nose daily. 03/11/24   Myriam Dorn BROCKS, PA  triamcinolone  ointment (KENALOG ) 0.1 % SMARTSIG:Sparingly Topical Twice Daily Patient not taking: Reported on 11/09/2022 12/21/19   [provider]    Allergies: Benazepril, Penicillins, Sulfa antibiotics, Ciprofloxacin hcl, Clindamycin/lincomycin, Erythromycin, Hydrocodone -acetaminophen , Other, Zithromax [azithromycin], Sulfamethoxazole, and Tape    Review of Systems  Updated Vital Signs BP (!) 191/69   Pulse 61   Temp 98 F (36.7 C)   Resp 16   SpO2 100%   Physical Exam Vitals and nursing note reviewed.  Cardiovascular:     Rate and Rhythm: Normal rate.  Pulmonary:     Effort: Pulmonary effort is normal.  Neurological:     General: No focal deficit present.     Gait: Gait normal.     (all labs ordered are listed, but only abnormal results are displayed) Labs Reviewed  COMPREHENSIVE METABOLIC PANEL WITH GFR - Abnormal; Notable for the following components:       Result Value   Glucose, Bld 121 (*)    All other components within normal limits  URINALYSIS, ROUTINE W REFLEX MICROSCOPIC - Abnormal; Notable for the following components:   Color, Urine COLORLESS (*)    Specific Gravity, Urine 1.002 (*)    Hgb urine dipstick SMALL (*)    All other components within normal limits  CBC WITH DIFFERENTIAL/PLATELET    EKG: None  Radiology: No results found.   Procedures   Medications Ordered in the ED  hydrochlorothiazide  (HYDRODIURIL ) tablet 25 mg (25 mg Oral Given 05/01/24 1357)  labetalol  (NORMODYNE ) injection 10 mg (10 mg Intravenous Given 05/01/24 1357)  losartan  (COZAAR ) tablet 50 mg (50 mg Oral Given 05/01/24 1418)                                    Medical Decision Making 63 year old female here today with hypertension.  Differential diagnose include benign hypertension, less likely hypertensive urgency.  Plan-patient only on 50 mg of losartan , likely needs to go to an increased dose.  Provided the patient with some of her older medications here in the ED to help get her numbers more reasonable while we work on slowly gaining improved blood pressure control.  Reviewed the patient's blood work, normal renal function, no protein in the urine.  No evidence of endorgan damage.  She is appropriate for discharge.  Let the patient follow-up with her PCP.    Amount and/or Complexity of Data Reviewed Labs: ordered.  Risk Prescription drug management.        Final diagnoses:  Essential hypertension    ED Discharge Orders          Ordered    losartan  (COZAAR ) 50 MG tablet  2 times daily        05/01/24 1437               Mannie Pac T, DO 05/01/24 1438

## 2024-05-01 NOTE — Discharge Instructions (Addendum)
 I would like you to increase your losartan  to 50 mg 2 times per day.  You can take it once in the morning and once in the evening.  You received an additional dose here in the emergency room.  You may take your next dose of losartan  tomorrow morning.  Continue taking all other medications as prescribed.  Please follow-up with your primary care doctor within 1 to 2 weeks.  Return to the emergency room if you develop headache, trouble with your vision or chest pain.

## 2024-05-01 NOTE — ED Triage Notes (Addendum)
 Patient report BP at home was 200/100. Patient report she's taking her BP medication regularly. Patient report headache and neck pain started yesterday. Patient denies N/V. Patient denies Chest pain and SOB.

## 2024-08-28 ENCOUNTER — Encounter (HOSPITAL_BASED_OUTPATIENT_CLINIC_OR_DEPARTMENT_OTHER): Payer: Self-pay

## 2024-08-28 ENCOUNTER — Other Ambulatory Visit: Payer: Self-pay

## 2024-08-28 ENCOUNTER — Emergency Department (HOSPITAL_BASED_OUTPATIENT_CLINIC_OR_DEPARTMENT_OTHER)
Admission: EM | Admit: 2024-08-28 | Discharge: 2024-08-28 | Disposition: A | Discharge status: Home / Self Care | Attending: Emergency Medicine | Admitting: Emergency Medicine

## 2024-08-28 DIAGNOSIS — M545 Low back pain, unspecified: Secondary | ICD-10-CM | POA: Insufficient documentation

## 2024-08-28 MED ORDER — METHYLPREDNISOLONE 4 MG PO TBPK: ORAL_TABLET | ORAL | 0 refills | Status: AC

## 2024-08-28 NOTE — ED Notes (Signed)
 [facility] in lab on hold. Warm blankets provided. Sister at bedside. Call bell within reach.

## 2024-08-28 NOTE — ED Triage Notes (Signed)
 Pt with hx of degenerative disk disease, scoliosis, and bulging disk arrives with lower back pain. She reports it started 2 days ago, mostly on the left side. Denies urinary or bowel symptoms.

## 2024-08-28 NOTE — ED Provider Notes (Signed)
 " [facility] Provider Note   CSN: [id number] Arrival date & time: 08/28/24  8361     Patient presents with: Back Pain   [name] is a 63 y.o. female.   HPI Patient reports she has been having some low back pain this week.  A couple of days ago she had low back pain on the right side she indicates just over the iliac wing.  She reports that it settled down by evening time yesterday.  She went to bed and the next day woke up feeling pretty good.  She reports today now she has a lot of pain across the left lower back again tracing over the top of the iliac area.  She reports its much worse with certain movements and gets really sharp.  She reports it causes some aching into her leg but has not made her weak.  She can walk but she reports she has to take her time so it is not makes the back hurt more.  No loss of control or bowel or bladder.  Patient reports she does have prescribed oxycodone  10 mg and cyclobenzaprine  but did not take any today because she did not know exactly why she was having pain and wanted to get it checked out rather than take medications.    Prior to Admission medications  Medication Sig Start Date End Date Taking? Authorizing Provider  methylPREDNISolone  (MEDROL  DOSEPAK) 4 MG TBPK tablet Per dose pack 08/28/24  Yes Ayaansh Smail, Ludivina, MD  albuterol  (PROVENTIL  HFA;VENTOLIN  HFA) 108 (90 Base) MCG/ACT inhaler Inhale 2 puffs into the lungs every 4 (four) hours as needed for wheezing or shortness of breath. 04/04/16   Collene Gouge I, NP  ALPRAZolam (XANAX) 0.25 MG tablet alprazolam 0.25 mg tablet  Take 1 tablet 3 times a day by oral route as needed. Patient not taking: Reported on 10/14/2021    [provider]  amLODipine  (NORVASC ) 10 MG tablet Take 1 tablet (10 mg total) by mouth daily. Take this instead of your LOTREL until you see your regular doctor: For high blood pressure Patient taking differently: Take 10 mg by  mouth daily. 04/04/16   Collene Gouge I, NP  atenolol  (TENORMIN ) 50 MG tablet Take 1 tablet (50 mg total) by mouth daily. Need Dr appt for future refills: For high blood pressure Patient taking differently: Take 50 mg by mouth daily. 04/04/16   Collene Gouge I, NP  atorvastatin (LIPITOR) 10 MG tablet Take 10 mg by mouth daily.    [provider]  Butenafine HCl 1 % cream Apply topically daily. 01/29/20   [provider]  cetirizine  (ZYRTEC  ALLERGY) 10 MG tablet Take 1 tablet (10 mg total) by mouth daily. 03/11/24   Myriam Dorn BROCKS, PA  citalopram (CELEXA) 20 MG tablet Take 20 mg by mouth daily as needed (depression/anxiety).    [provider]  clorazepate  (TRANXENE ) 7.5 MG tablet Take by mouth.    [provider]  cycloSPORINE (RESTASIS) 0.05 % ophthalmic emulsion Restasis 0.05 % eye drops in a dropperette  INSTILL 1 DROP INTO AFFECTED EYE(S) BY OPHTHALMIC ROUTE EVERY 12 HOURS as needed    [provider]  estradiol  (CLIMARA ) 0.1 mg/24hr patch Place 1 patch (0.1 mg total) onto the skin once a week. Patient not taking: Reported on 11/09/2022 10/14/21   Rudy Carlin LABOR, MD  fluticasone  (FLONASE ) 50 MCG/ACT nasal spray     [provider]  hydrochlorothiazide  (MICROZIDE ) 12.5 MG capsule Take  12.5 mg by mouth daily.    [provider]  ibuprofen  (ADVIL ) 800 MG tablet Take 1 tablet (800 mg total) by mouth 3 (three) times daily with meals as needed for headache or moderate pain. Patient not taking: Reported on 11/09/2022 10/14/21   Rudy Carlin LABOR, MD  ibuprofen  (ADVIL ,MOTRIN ) 800 MG tablet TAKE 1 TABLET (800 MG TOTAL) BY MOUTH EVERY EIGHT HOURS AS NEEDED. Patient not taking: Reported on 11/09/2022 10/26/17   Rudy Carlin LABOR, MD  levothyroxine  (SYNTHROID , LEVOTHROID) 125 MCG tablet Take 1 tablet (125 mcg total) by mouth daily. For hypothyroidism 04/04/16   Collene Gouge I, NP  loperamide  (IMODIUM ) 2 MG capsule Take 1 capsule (2 mg total) by mouth 4  (four) times daily as needed for diarrhea or loose stools. Patient not taking: Reported on 11/09/2022 08/08/22   Randol Simmonds, MD  loratadine (CLARITIN) 10 MG tablet loratadine 10 mg tablet  TAKE 1 TABLET BY MOUTH DAILY IN THE EVENING    [provider]  losartan  (COZAAR ) 50 MG tablet Take 1 tablet (50 mg total) by mouth in the morning and at bedtime. 05/01/24 05/31/24  Mannie Pac T, DO  meclizine  (ANTIVERT ) 25 MG tablet Take 1 tablet (25 mg total) by mouth 2 (two) times daily. Patient not taking: Reported on 11/09/2022 02/24/21   Horton, Kristie M, DO  neomycin-bacitracin -polymyxin (NEOSPORIN) OINT Apply 1 application topically 2 (two) times daily as needed (for skin irritation on hands).  Patient not taking: Reported on 04/12/2020    [provider]  Olopatadine HCl 0.2 % SOLN olopatadine 0.2 % eye drops  INSTILL 1 DROP INTO AFFECTED EYE(S) BY OPHTHALMIC ROUTE ONCE DAILY    [provider]  ondansetron  (ZOFRAN ) 4 MG tablet Take 1 tablet (4 mg total) by mouth every 6 (six) hours. Patient not taking: Reported on 04/12/2020 10/04/16   Griselda Norris, MD  ondansetron  (ZOFRAN -ODT) 8 MG disintegrating tablet Take 1 tablet (8 mg total) by mouth every 8 (eight) hours as needed for nausea or vomiting. Patient not taking: Reported on 11/09/2022 08/08/22   Randol Simmonds, MD  Oxycodone  HCl 10 MG TABS Take 10 mg by mouth every 6 (six) hours as needed. Patient not taking: Reported on 11/09/2022 03/03/20   [provider]  oxyCODONE -acetaminophen  (PERCOCET) 10-325 MG tablet Take 1 tablet by mouth every 4 (four) hours as needed for pain. Patient not taking: Reported on 11/09/2022 02/05/17   Rudy Carlin LABOR, MD  polycarbophil (FIBERCON) 625 MG tablet Take 1 tablet (625 mg total) by mouth daily as needed (for regularity). Constipation Patient not taking: Reported on 11/09/2022 04/04/16   Collene Gouge I, NP  polyethylene glycol (MIRALAX  / GLYCOLAX ) packet Take 17 g by mouth daily as needed for mild  constipation. 04/04/16   Collene Gouge I, NP  potassium chloride  SA (KLOR-CON  M) 20 MEQ tablet Take 1 tablet (20 mEq total) by mouth 2 (two) times daily for 7 days. 08/08/22 08/15/22  Randol Simmonds, MD  predniSONE  (DELTASONE ) 20 MG tablet 3 tabs po day one, then 2 po daily x 4 days Patient not taking: Reported on 11/09/2022 06/09/22   Lenor Hollering, MD  QUEtiapine  (SEROQUEL ) 25 MG tablet Seroquel  25 mg tablet  Take 1 tablet(s) every day by oral route at bedtime.    [provider]  sodium chloride  (OCEAN) 0.65 % SOLN nasal spray Place 1 spray into both nostrils as needed for congestion. 03/11/24   Myriam Dorn BROCKS, PA  terconazole  (TERAZOL 7 ) 0.4 % vaginal cream PLACE  1 APPLICATOR VAGINALLY AT BEDTIME. Patient not taking: Reported on 11/09/2022 07/12/22   Rudy Carlin LABOR, MD  tinidazole  (TINDAMAX ) 500 MG tablet Take 2 tablets (1,000 mg total) by mouth daily with breakfast. Patient not taking: Reported on 11/09/2022 04/13/20   Rudy Carlin LABOR, MD  tiotropium (SPIRIVA HANDIHALER) 18 MCG inhalation capsule Spiriva with HandiHaler 18 mcg and inhalation capsules  Inhale 1 capsule every day by inhalation route for 30 days.    [provider]  traMADol (ULTRAM) 50 MG tablet Ultram 50 mg tablet  Take 1 tablet every 6 hours by oral route as needed for 5 days. Patient not taking: Reported on 11/09/2022    [provider]  triamcinolone  (NASACORT ) 55 MCG/ACT AERO nasal inhaler Place 2 sprays into the nose daily. 03/11/24   Myriam Dorn BROCKS, PA  triamcinolone  ointment (KENALOG ) 0.1 % SMARTSIG:Sparingly Topical Twice Daily Patient not taking: Reported on 11/09/2022 12/21/19   [provider]    Allergies: Benazepril, Penicillins, Sulfa antibiotics, Ciprofloxacin hcl, Clindamycin/lincomycin, Erythromycin, Hydrocodone -acetaminophen , Other, Zithromax [azithromycin], Sulfamethoxazole, and Tape    Review of Systems  Updated Vital Signs BP (!) 146/68   Pulse 68   Temp 98.4 F (36.9 C)  (Temporal)   Resp 16   SpO2 100%   Physical Exam Constitutional:      Comments: Alert nontoxic.  Clear mental status.  No respiratory distress.  HENT:     Mouth/Throat:     Pharynx: Oropharynx is clear.  Cardiovascular:     Rate and Rhythm: Normal rate and regular rhythm.  Pulmonary:     Effort: Pulmonary effort is normal.     Breath sounds: Normal breath sounds.  Abdominal:     General: There is no distension.     Palpations: Abdomen is soft.     Tenderness: There is no abdominal tenderness. There is no guarding.  Musculoskeletal:     Comments: Patient indicates area of pain is from the midline right at about the L4-5 area across the top of the iliac crest.  No palpable soft tissue abnormality and no rash present.  Pain is not reproducible by palpation but is reproducible by forward flexion or twisting.  Patient does not have any peripheral edema.  Lower extremities are in good condition.  Calves are soft and pliable.  Skin:    General: Skin is warm and dry.  Neurological:     General: No focal deficit present.     Mental Status: She is oriented to person, place, and time.  Psychiatric:        Mood and Affect: Mood normal.     (all labs ordered are listed, but only abnormal results are displayed) Labs Reviewed - No data to display  EKG: None  Radiology: No results found.   Procedures   Medications Ordered in the ED - No data to display                                  Medical Decision Making  Patient presents with low back pain as outlined.  2 days ago it was in a similar location on the right side and today is on the left.  It is very much provoked by position change.  She has some pain going into the hip and the leg but no weakness.  Borderline for sciatica symptoms.  No abdominal pain.  At this time consistent with muscle skeletal low back pain without neurologic  dysfunction.  Prior x-ray reviewed [phone] indicates history of chronic low back pain with fusion  and L5-S1 degenerative changes.  With pain being slightly migratory I suspect significant degenerative changes that intermittently impinge on spinal nerves.  At this time stable to treat empirically for pain and follow-up in outpatient basis.  Will start a Medrol  Dosepak.  Patient reports having oxycodone  10 mg and cyclobenzaprine  at home.  I recommend she can take these as previously prescribed and follow-up.     Final diagnoses:  Acute left-sided low back pain without sciatica    ED Discharge Orders          Ordered    methylPREDNISolone  (MEDROL  DOSEPAK) 4 MG TBPK tablet        08/28/24 1906               Armenta Canning, MD 08/28/24 1916  "

## 2024-08-28 NOTE — Discharge Instructions (Signed)
 1.  You likely have a pinched nerve from arthritis.  You may take the steroid called methylprednisolone .  Instructions are on the package. 2.  Continue taking your previously prescribed oxycodone  and Flexeril  for pain. 3.  See your family doctor for recheck within the next week. 4.  Return to the emergency department if you have weakness or numbness in your leg, dysfunction of your bowel or bladder or other concerning changes.
